# Patient Record
Sex: Male | Born: 1955 | Race: White | Hispanic: No | Marital: Married | Smoking: Former smoker
Health system: Southern US, Community
[De-identification: ages and names within clinical notes are randomized; demographics above are authoritative.]

## PROBLEM LIST (undated history)

## (undated) DIAGNOSIS — M1990 Unspecified osteoarthritis, unspecified site: Secondary | ICD-10-CM

## (undated) DIAGNOSIS — F1010 Alcohol abuse, uncomplicated: Secondary | ICD-10-CM

## (undated) DIAGNOSIS — I10 Essential (primary) hypertension: Secondary | ICD-10-CM

## (undated) DIAGNOSIS — K219 Gastro-esophageal reflux disease without esophagitis: Secondary | ICD-10-CM

## (undated) DIAGNOSIS — E785 Hyperlipidemia, unspecified: Secondary | ICD-10-CM

## (undated) DIAGNOSIS — K635 Polyp of colon: Secondary | ICD-10-CM

## (undated) DIAGNOSIS — R7989 Other specified abnormal findings of blood chemistry: Secondary | ICD-10-CM

## (undated) DIAGNOSIS — J449 Chronic obstructive pulmonary disease, unspecified: Secondary | ICD-10-CM

## (undated) DIAGNOSIS — R945 Abnormal results of liver function studies: Secondary | ICD-10-CM

## (undated) HISTORY — DX: Alcohol abuse, uncomplicated: F10.10

## (undated) HISTORY — DX: Other specified abnormal findings of blood chemistry: R79.89

## (undated) HISTORY — DX: Chronic obstructive pulmonary disease, unspecified: J44.9

## (undated) HISTORY — DX: Hyperlipidemia, unspecified: E78.5

## (undated) HISTORY — PX: OTHER SURGICAL HISTORY: SHX169

## (undated) HISTORY — DX: Polyp of colon: K63.5

## (undated) HISTORY — PX: VASECTOMY: SHX75

## (undated) HISTORY — DX: Essential (primary) hypertension: I10

## (undated) HISTORY — PX: CARPAL TUNNEL RELEASE: SHX101

## (undated) HISTORY — DX: Abnormal results of liver function studies: R94.5

---

## 2003-06-29 ENCOUNTER — Emergency Department (HOSPITAL_COMMUNITY): Admission: EM | Admit: 2003-06-29 | Discharge: 2003-06-29 | Payer: Self-pay | Admitting: Emergency Medicine

## 2003-06-29 ENCOUNTER — Encounter: Payer: Self-pay | Admitting: Emergency Medicine

## 2003-07-08 ENCOUNTER — Ambulatory Visit (HOSPITAL_COMMUNITY): Admission: RE | Admit: 2003-07-08 | Discharge: 2003-07-08 | Payer: Self-pay | Admitting: *Deleted

## 2004-04-05 ENCOUNTER — Emergency Department (HOSPITAL_COMMUNITY): Admission: EM | Admit: 2004-04-05 | Discharge: 2004-04-05 | Payer: Self-pay | Admitting: Emergency Medicine

## 2007-07-20 ENCOUNTER — Encounter: Admission: RE | Admit: 2007-07-20 | Discharge: 2007-07-20 | Payer: Self-pay | Admitting: Occupational Medicine

## 2008-04-05 ENCOUNTER — Ambulatory Visit: Payer: Self-pay | Admitting: Internal Medicine

## 2008-05-03 ENCOUNTER — Encounter: Payer: Self-pay | Admitting: Internal Medicine

## 2008-05-03 ENCOUNTER — Ambulatory Visit: Payer: Self-pay | Admitting: Internal Medicine

## 2008-05-03 DIAGNOSIS — K635 Polyp of colon: Secondary | ICD-10-CM

## 2008-05-03 HISTORY — DX: Polyp of colon: K63.5

## 2008-05-03 LAB — HM COLONOSCOPY

## 2008-05-06 ENCOUNTER — Encounter: Payer: Self-pay | Admitting: Internal Medicine

## 2009-01-31 ENCOUNTER — Ambulatory Visit: Payer: Self-pay | Admitting: Internal Medicine

## 2009-01-31 DIAGNOSIS — E781 Pure hyperglyceridemia: Secondary | ICD-10-CM

## 2009-01-31 DIAGNOSIS — I10 Essential (primary) hypertension: Secondary | ICD-10-CM

## 2009-01-31 DIAGNOSIS — G5600 Carpal tunnel syndrome, unspecified upper limb: Secondary | ICD-10-CM

## 2009-01-31 LAB — CONVERTED CEMR LAB
ALT: 30 units/L (ref 0–53)
AST: 27 units/L (ref 0–37)
Basophils Relative: 0 % (ref 0.0–3.0)
Bilirubin, Direct: 0.2 mg/dL (ref 0.0–0.3)
Calcium: 9.8 mg/dL (ref 8.4–10.5)
Chloride: 98 meq/L (ref 96–112)
Cholesterol: 224 mg/dL (ref 0–200)
Crystals: NEGATIVE
Direct LDL: 120.2 mg/dL
Eosinophils Relative: 1.5 % (ref 0.0–5.0)
GFR calc Af Amer: 101 mL/min
GFR calc non Af Amer: 84 mL/min
Glucose, Bld: 89 mg/dL (ref 70–99)
HCT: 46.2 % (ref 39.0–52.0)
HDL: 73.8 mg/dL (ref >39.0)
Hemoglobin: 16.2 g/dL (ref 13.0–17.0)
Leukocytes, UA: NEGATIVE
Neutro Abs: 4.7 10*3/uL (ref 1.4–7.7)
Platelets: 263 10*3/uL (ref 150–400)
Potassium: 4.2 meq/L (ref 3.5–5.1)
RBC: 4.81 M/uL (ref 4.22–5.81)
RDW: 11.9 % (ref 11.5–14.6)
Sodium: 136 meq/L (ref 135–145)
Specific Gravity, Urine: 1.025 (ref 1.000–1.035)
Squamous Epithelial / LPF: NEGATIVE /lpf
TSH: 2.94 microintl units/mL (ref 0.35–5.50)
Total CHOL/HDL Ratio: 3
Total Protein, Urine: NEGATIVE mg/dL
Total Protein: 7.6 g/dL (ref 6.0–8.3)
Triglycerides: 78 mg/dL (ref 0–149)
Urine Glucose: NEGATIVE mg/dL
Urobilinogen, UA: 0.2 (ref 0.0–1.0)
VLDL: 16 mg/dL (ref 0–40)
Vitamin B-12: 659 pg/mL (ref 211–911)

## 2009-02-03 ENCOUNTER — Encounter: Payer: Self-pay | Admitting: Internal Medicine

## 2009-02-06 ENCOUNTER — Encounter (INDEPENDENT_AMBULATORY_CARE_PROVIDER_SITE_OTHER): Payer: Self-pay | Admitting: *Deleted

## 2009-02-21 ENCOUNTER — Encounter: Payer: Self-pay | Admitting: Internal Medicine

## 2009-02-27 ENCOUNTER — Encounter: Payer: Self-pay | Admitting: Internal Medicine

## 2009-03-04 ENCOUNTER — Encounter: Payer: Self-pay | Admitting: Internal Medicine

## 2010-02-13 ENCOUNTER — Telehealth: Payer: Self-pay | Admitting: Internal Medicine

## 2010-02-13 ENCOUNTER — Ambulatory Visit: Payer: Self-pay | Admitting: Internal Medicine

## 2010-02-13 ENCOUNTER — Encounter: Payer: Self-pay | Admitting: Internal Medicine

## 2010-02-13 DIAGNOSIS — M87059 Idiopathic aseptic necrosis of unspecified femur: Secondary | ICD-10-CM | POA: Insufficient documentation

## 2010-02-13 DIAGNOSIS — IMO0001 Reserved for inherently not codable concepts without codable children: Secondary | ICD-10-CM

## 2010-02-13 DIAGNOSIS — M25559 Pain in unspecified hip: Secondary | ICD-10-CM

## 2010-02-13 DIAGNOSIS — F1020 Alcohol dependence, uncomplicated: Secondary | ICD-10-CM

## 2010-02-13 LAB — CONVERTED CEMR LAB
ALT: 25 units/L (ref 0–53)
AST: 21 units/L (ref 0–37)
BUN: 18 mg/dL (ref 6–23)
Basophils Absolute: 0 10*3/uL (ref 0.0–0.1)
Basophils Relative: 0.8 % (ref 0.0–3.0)
Bilirubin Urine: NEGATIVE
Bilirubin, Direct: 0.1 mg/dL (ref 0.0–0.3)
CO2: 29 meq/L (ref 19–32)
Chloride: 104 meq/L (ref 96–112)
Creatinine, Ser: 0.9 mg/dL (ref 0.4–1.5)
Eosinophils Absolute: 0.1 10*3/uL (ref 0.0–0.7)
HDL goal, serum: 40 mg/dL
HDL: 75.1 mg/dL (ref >39.00)
Hemoglobin: 14.7 g/dL (ref 13.0–17.0)
LDL Goal: 160 mg/dL
Leukocytes, UA: NEGATIVE
Lymphocytes Relative: 20.9 % (ref 12.0–46.0)
Lymphs Abs: 1 10*3/uL (ref 0.7–4.0)
MCHC: 33.3 g/dL (ref 30.0–36.0)
Monocytes Absolute: 0.4 10*3/uL (ref 0.1–1.0)
Neutrophils Relative %: 67.5 % (ref 43.0–77.0)
PSA: 0.36 ng/mL (ref 0.10–4.00)
RBC: 4.5 M/uL (ref 4.22–5.81)
Sodium: 139 meq/L (ref 135–145)
Total Bilirubin: 0.7 mg/dL (ref 0.3–1.2)
Total Protein: 7.5 g/dL (ref 6.0–8.3)
Urobilinogen, UA: 0.2 (ref 0.0–1.0)
WBC: 4.8 10*3/uL (ref 4.5–10.5)
pH: 5.5 (ref 5.0–8.0)

## 2010-02-25 ENCOUNTER — Ambulatory Visit (HOSPITAL_COMMUNITY): Admission: RE | Admit: 2010-02-25 | Discharge: 2010-02-25 | Payer: Self-pay | Admitting: Orthopedic Surgery

## 2010-04-03 ENCOUNTER — Ambulatory Visit: Payer: Self-pay | Admitting: Internal Medicine

## 2010-05-08 ENCOUNTER — Ambulatory Visit: Payer: Self-pay | Admitting: Internal Medicine

## 2010-05-08 LAB — CONVERTED CEMR LAB
ALT: 30 units/L (ref 0–53)
Albumin: 4.1 g/dL (ref 3.5–5.2)
Alkaline Phosphatase: 58 units/L (ref 39–117)
BUN: 19 mg/dL (ref 6–23)
Bilirubin, Direct: 0.1 mg/dL (ref 0.0–0.3)
Chloride: 110 meq/L (ref 96–112)
Cholesterol: 206 mg/dL — ABNORMAL HIGH (ref 0–200)
Creatinine, Ser: 0.8 mg/dL (ref 0.4–1.5)
Direct LDL: 118 mg/dL
GFR calc non Af Amer: 112.21 mL/min (ref >60)
Glucose, Bld: 106 mg/dL — ABNORMAL HIGH (ref 70–99)
HDL: 66.6 mg/dL (ref >39.00)
Sodium: 142 meq/L (ref 135–145)
Total Bilirubin: 0.7 mg/dL (ref 0.3–1.2)
Total CHOL/HDL Ratio: 3

## 2010-06-04 ENCOUNTER — Telehealth: Payer: Self-pay | Admitting: Internal Medicine

## 2010-06-14 ENCOUNTER — Encounter: Payer: Self-pay | Admitting: Internal Medicine

## 2010-06-15 ENCOUNTER — Encounter: Payer: Self-pay | Admitting: Internal Medicine

## 2010-07-01 ENCOUNTER — Telehealth: Payer: Self-pay | Admitting: Internal Medicine

## 2010-07-01 ENCOUNTER — Encounter: Payer: Self-pay | Admitting: Internal Medicine

## 2010-07-08 ENCOUNTER — Telehealth: Payer: Self-pay | Admitting: Internal Medicine

## 2010-07-28 ENCOUNTER — Encounter: Payer: Self-pay | Admitting: Internal Medicine

## 2010-12-02 ENCOUNTER — Encounter: Payer: Self-pay | Admitting: Internal Medicine

## 2010-12-08 ENCOUNTER — Encounter: Payer: Self-pay | Admitting: Internal Medicine

## 2010-12-23 ENCOUNTER — Encounter: Payer: Self-pay | Admitting: Internal Medicine

## 2010-12-29 NOTE — Medication Information (Signed)
Summary: Denial/[employer] [place]  [employer] [place]   Imported By: Bubba Hales 07/06/2010 09:47:46  _____________________________________________________________________  External Attachment:    Type:   Image     Comment:   External Document

## 2010-12-29 NOTE — Assessment & Plan Note (Signed)
Summary: 1 mos f/u #/ cd   Vital Signs:  Patient profile:   55 year old male Height:      69 inches Weight:      203.50 pounds BMI:     30.16 O2 Sat:      97 % on Room air Temp:     97.8 degrees F oral Pulse rate:   73 / minute Pulse rhythm:   regular BP sitting:   130 / 82  (left arm) Cuff size:   large  Vitals Entered By: Estell Harpin CMA (May 08, 2010 8:36 AM)  O2 Flow:  Room air CC: follow-up visit//discuss lab order for cholesterol, Hypertension Management, Lipid Management   Primary Care Provider:  Janith Lima MD  CC:  follow-up visit//discuss lab order for cholesterol, Hypertension Management, and Lipid Management.  History of Present Illness:  Follow-Up Visit      This is a 55 year old man who presents for Follow-up visit.  The patient denies chest pain, palpitations, dizziness, syncope, edema, SOB, DOE, PND, and orthopnea.  Since the last visit the patient notes no new problems or concerns and being seen by a specialist ( [place], ortho ).  The patient reports taking meds as prescribed, monitoring BP, and dietary compliance.  When questioned about possible medication side effects, the patient notes none.    Hypertension History:      He denies headache, chest pain, palpitations, neurologic problems, syncope, and side effects from treatment.  He notes no problems with any antihypertensive medication side effects.        Positive major cardiovascular risk factors include male age 51 years old or older, hyperlipidemia, and hypertension.  Negative major cardiovascular risk factors include no history of diabetes, negative family history for ischemic heart disease, and non-tobacco-user status.        Further assessment for target organ damage reveals no history of ASHD, cardiac end-organ damage (CHF/LVH), stroke/TIA, peripheral vascular disease, renal insufficiency, or hypertensive retinopathy.    Lipid Management History:      Positive NCEP/ATP III risk factors  include male age 88 years old or older and hypertension.  Negative NCEP/ATP III risk factors include non-diabetic, HDL cholesterol greater than 60, no family history for ischemic heart disease, non-tobacco-user status, no ASHD (atherosclerotic heart disease), no prior stroke/TIA, no peripheral vascular disease, and no history of aortic aneurysm.        The patient states that he knows about the "Therapeutic Lifestyle Change" diet.  His compliance with the TLC diet is good.  The patient expresses understanding of adjunctive measures for cholesterol lowering.  Adjunctive measures started by the patient include aerobic exercise, fiber, limit alcohol consumpton, and weight reduction.  He expresses no side effects from his lipid-lowering medication.  The patient denies any symptoms to suggest myopathy or liver disease.      Current Medications (verified): 1)  Exforge 5-160 Mg Tabs (Amlodipine Besylate-Valsartan) .... One By Mouth Once Daily For High Blood Pressure 2)  Lyrica 75 Mg Caps (Pregabalin) .... Take 1 Tablet By Mouth Two Times A Day 3)  Tramadol Hcl 50 Mg Tabs (Tramadol Hcl) 4)  Ibuprofen 5)  Livalo 4 Mg Tabs (Pitavastatin Calcium) .... One By Mouth Once Daily For Cholesterol  Allergies (verified): 1)  ! Lipitor (Atorvastatin Calcium)  Past History:  Past Medical History: Last updated: 01/31/2009 Hyperlipidemia Hypertension  Past Surgical History: Last updated: 01/31/2009 Vasectomy  Family History: Last updated: 01/31/2009 Family History of Alcoholism/Addiction Family History of Arthritis Family  History Diabetes 1st degree relative Family History High cholesterol Family History Hypertension  Social History: Last updated: 01/31/2009 Occupation: Dealer Married Alcohol use-yes Drug use-no Regular exercise-no  Risk Factors: Alcohol Use: >5 (04/03/2010) >5 drinks/d w/in last 3 months: yes (04/03/2010) Exercise: no (01/31/2009)  Risk Factors: Smoking Status: quit  (04/03/2010) Passive Smoke Exposure: no (04/03/2010)  Family History: Reviewed history from 01/31/2009 and no changes required. Family History of Alcoholism/Addiction Family History of Arthritis Family History Diabetes 1st degree relative Family History High cholesterol Family History Hypertension  Social History: Reviewed history from 01/31/2009 and no changes required. Occupation: Dealer Married Alcohol use-yes Drug use-no Regular exercise-no  Review of Systems  The patient denies chest pain, syncope, abdominal pain, hematuria, and difficulty walking.   MS:  Denies joint pain, joint swelling, loss of strength, low back pain, mid back pain, muscle aches, muscle weakness, and stiffness.  Physical Exam  General:  alert, well-developed, well-nourished, well-hydrated, and appropriate dress.   Mouth:  Oral mucosa and oropharynx without lesions or exudates.  Teeth in good repair. Neck:  No deformities, masses, or tenderness noted. Lungs:  Normal respiratory effort, chest expands symmetrically. Lungs are clear to auscultation, no crackles or wheezes. Heart:  Normal rate and regular rhythm. S1 and S2 normal without gallop, murmur, click, rub or other extra sounds. Abdomen:  soft, non-tender, normal bowel sounds, no distention, no masses, no guarding, no rigidity, no rebound tenderness, no abdominal hernia, no inguinal hernia, no hepatomegaly, and no splenomegaly.   Msk:  normal ROM, no joint tenderness, no joint swelling, and no joint warmth.   Pulses:  R and L carotid,radial,femoral,dorsalis pedis and posterior tibial pulses are full and equal bilaterally Extremities:  No clubbing, cyanosis, edema, or deformity noted with normal full range of motion of all joints.   Neurologic:  No cranial nerve deficits noted. Station and gait are normal. Plantar reflexes are down-going bilaterally. DTRs are symmetrical throughout. Sensory, motor and coordinative functions appear intact. Skin:   Intact without suspicious lesions or rashes Cervical Nodes:  no anterior cervical adenopathy and no posterior cervical adenopathy.   Psych:  Oriented X3, memory intact for recent and remote, normally interactive, good eye contact, not anxious appearing, not depressed appearing, not agitated, and not suicidal.     Impression & Recommendations:  Problem # 1:  HYPERTENSION (ICD-401.9) Assessment Improved  His updated medication list for this problem includes:    Exforge 5-160 Mg Tabs (Amlodipine besylate-valsartan) ..... One by mouth once daily for high blood pressure  Orders: Venipuncture (83151) TLB-Lipid Panel (80061-LIPID) TLB-BMP (Basic Metabolic Panel-BMET) (76160-[id number]) TLB-Hepatic/Liver Function Pnl (80076-HEPATIC)  BP today: 130/82 Prior BP: 140/82 (04/03/2010)  Prior 10 Yr Risk Heart Disease: 4 % (04/03/2010)  Labs Reviewed: K+: 4.6 (02/13/2010) Creat: : 0.9 (02/13/2010)   Chol: 190 (02/13/2010)   HDL: 75.10 (02/13/2010)   LDL: 99 (02/13/2010)   TG: 78.0 (02/13/2010)  Problem # 2:  HYPERLIPIDEMIA (ICD-272.4) Assessment: Improved  His updated medication list for this problem includes:    Livalo 4 Mg Tabs (Pitavastatin calcium) ..... One by mouth once daily for cholesterol  Orders: Venipuncture (69485) TLB-Lipid Panel (80061-LIPID) TLB-BMP (Basic Metabolic Panel-BMET) ([id number]) TLB-Hepatic/Liver Function Pnl (80076-HEPATIC)  Labs Reviewed: SGOT: 21 (02/13/2010)   SGPT: 25 (02/13/2010)  Lipid Goals: Chol Goal: 200 (02/13/2010)   HDL Goal: 40 (02/13/2010)   LDL Goal: 160 (02/13/2010)   TG Goal: 150 (02/13/2010)  Prior 10 Yr Risk Heart Disease: 4 % (04/03/2010)   HDL:75.10 (02/13/2010), 73.8 (01/31/2009)  LDL:99 (02/13/2010),  DEL (01/31/2009)  Chol:190 (02/13/2010), 224 (01/31/2009)  Trig:78.0 (02/13/2010), 78 (01/31/2009)  Problem # 3:  UNSPECIFIED MYALGIA AND MYOSITIS (ICD-729.1) Assessment: Improved  His updated medication list for this problem  includes:    Tramadol Hcl 50 Mg Tabs (Tramadol hcl)  Complete Medication List: 1)  Exforge 5-160 Mg Tabs (Amlodipine besylate-valsartan) .... One by mouth once daily for high blood pressure 2)  Lyrica 75 Mg Caps (Pregabalin) .... Take 1 tablet by mouth two times a day 3)  Tramadol Hcl 50 Mg Tabs (Tramadol hcl) 4)  Ibuprofen  5)  Livalo 4 Mg Tabs (Pitavastatin calcium) .... One by mouth once daily for cholesterol  Hypertension Assessment/Plan:      The patient's hypertensive risk group is category B: At least one risk factor (excluding diabetes) with no target organ damage.  His calculated 10 year risk of coronary heart disease is 4 %.  Today's blood pressure is 130/82.  His blood pressure goal is < 140/90.  Lipid Assessment/Plan:      Based on NCEP/ATP III, the patient's risk factor category is "0-1 risk factors".  The patient's lipid goals are as follows: Total cholesterol goal is 200; LDL cholesterol goal is 160; HDL cholesterol goal is 40; Triglyceride goal is 150.    Patient Instructions: 1)  Please schedule a follow-up appointment in 4 months. 2)  It is important that you exercise regularly at least 20 minutes 5 times a week. If you develop chest pain, have severe difficulty breathing, or feel very tired , stop exercising immediately and seek medical attention. 3)  You need to lose weight. Consider a lower calorie diet and regular exercise.  4)  Check your Blood Pressure regularly. If it is above 140/90: you should make an appointment. Prescriptions: LIVALO 4 MG TABS (PITAVASTATIN CALCIUM) One by mouth once daily for cholesterol  #30 x 11   Entered and Authorized by:   Janith Lima MD   Signed by:   Janith Lima MD on 05/08/2010   Method used:   Print then Give to Patient   RxID:   [id number] EXFORGE 5-160 MG TABS (AMLODIPINE BESYLATE-VALSARTAN) One by mouth once daily for high blood pressure  #30 x 11   Entered and Authorized by:   Janith Lima MD   Signed by:    Janith Lima MD on 05/08/2010   Method used:   Print then Give to Patient   RxID:   [id number]    Not Administered:    Influenza Vaccine not given due to: vaccine availability

## 2010-12-29 NOTE — Assessment & Plan Note (Signed)
Summary: CPX/WIFE MADE APPT/[employer]/#/CD   Vital Signs:  Patient profile:   55 year old male Height:      69 inches (175.26 cm) Weight:      192 pounds (87.27 kg) BMI:     28.46 O2 Sat:      97 % on Room air Temp:     98.3 degrees F (36.83 degrees C) oral Pulse rate:   80 / minute Pulse rhythm:   regular Resp:     16 per minute BP supine:   152 / 104  (right arm) BP sitting:   150 / 100  (left arm) Cuff size:   large  Vitals Entered By: Charlsie Quest, CMA (February 13, 2010 8:09 AM) Taken by Felipa Evener, SMA  Nutrition Counseling: Patient's BMI is greater than 25 and therefore counseled on weight management options.  O2 Flow:  Room air CC: Physical, leg/hip complaints /[place], Hypertension Management, Lipid Management, Preventive Care   Primary Care Provider:  Janith Lima MD  CC:  Physical, leg/hip complaints /[place], Hypertension Management, Lipid Management, and Preventive Care.  History of Present Illness: He returns for a complete physical with complaints.  1. One year hx. of bialteral hip pain with no preceding trauma or injury. The pain is so severe at the end of the day that he has some limping.  2. 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 He does not want to miss work for the recovery so he wears splints.  3. Htn. f/up.  Hypertension History:      He denies headache, chest pain, palpitations, dyspnea with exertion, orthopnea, PND, peripheral edema, visual symptoms, neurologic problems, syncope, and side effects from treatment.  He notes no problems with any antihypertensive medication side effects.        Positive major cardiovascular risk factors include male age 23 years old or older, hyperlipidemia, and hypertension.  Negative major cardiovascular risk factors include no history of diabetes, negative family history for ischemic heart disease, and non-tobacco-user status.        Further assessment for target organ  damage reveals no history of ASHD, cardiac end-organ damage (CHF/LVH), stroke/TIA, peripheral vascular disease, renal insufficiency, or hypertensive retinopathy.    Lipid Management History:      Positive NCEP/ATP III risk factors include male age 57 years old or older and hypertension.  Negative NCEP/ATP III risk factors include non-diabetic, HDL cholesterol greater than 60, no family history for ischemic heart disease, non-tobacco-user status, no ASHD (atherosclerotic heart disease), no prior stroke/TIA, no peripheral vascular disease, and no history of aortic aneurysm.        The patient states that he knows about the "Therapeutic Lifestyle Change" diet.  His compliance with the TLC diet is good.  The patient expresses understanding of adjunctive measures for cholesterol lowering.  Adjunctive measures started by the patient include aerobic exercise, fiber, and ASA.  He notes side effects from his lipid-lowering medication.  The patient notes symptoms to suggest myopathy or liver disease.  Comments include: myalgias.      Preventive Screening-Counseling & Management  Alcohol-Tobacco     Alcohol drinks/day: >5     Alcohol type: beer     >5/day in last 3 mos: yes     Alcohol Counseling: to STOP drinking     Feels need to cut down: yes     Feels annoyed by complaints: no     Feels guilty re: drinking: no  Needs 'eye opener' in am: no     Smoking Status: quit     Year Quit: 2006     Pack years: 35     Passive Smoke Exposure: no  Clinical Review Panels:  Prevention   Last Colonoscopy:  Location:  [place].  (05/03/2008)  Lipid Management   Cholesterol:  224 (01/31/2009)   LDL (bad choesterol):  DEL (01/31/2009)   HDL (good cholesterol):  73.8 (01/31/2009)  Diabetes Management   Creatinine:  1.0 (01/31/2009)  CBC   WBC:  6.5 (01/31/2009)   RBC:  4.81 (01/31/2009)   Hgb:  16.2 (01/31/2009)   Hct:  46.2 (01/31/2009)   Platelets:  263 (01/31/2009)   MCV  96.0  (01/31/2009)   MCHC  35.0 (01/31/2009)   RDW  11.9 (01/31/2009)   PMN:  72.1 (01/31/2009)   Lymphs:  19.7 (01/31/2009)   Monos:  6.7 (01/31/2009)   Eosinophils:  1.5 (01/31/2009)   Basophil:  0.0 (01/31/2009)  Complete Metabolic Panel   Glucose:  89 (01/31/2009)   Sodium:  136 (01/31/2009)   Potassium:  4.2 (01/31/2009)   Chloride:  98 (01/31/2009)   CO2:  29 (01/31/2009)   BUN:  17 (01/31/2009)   Creatinine:  1.0 (01/31/2009)   Albumin:  4.6 (01/31/2009)   Total Protein:  7.6 (01/31/2009)   Calcium:  9.8 (01/31/2009)   Total Bili:  1.5 (01/31/2009)   Alk Phos:  59 (01/31/2009)   SGPT (ALT):  30 (01/31/2009)   SGOT (AST):  27 (01/31/2009)   Current Medications (verified): 1)  Lipitor 40 Mg Tabs (Atorvastatin Calcium) .... Take 1 Tablet By Mouth Once A Day 2)  Lisinopril 20 Mg Tabs (Lisinopril) .... Take 1 Tablet By Mouth Once A Day  Allergies (verified): 1)  ! Lipitor (Atorvastatin Calcium)  Past History:  Past Medical History: Reviewed history from 01/31/2009 and no changes required. Hyperlipidemia Hypertension  Past Surgical History: Reviewed history from 01/31/2009 and no changes required. Vasectomy  Family History: Reviewed history from 01/31/2009 and no changes required. Family History of Alcoholism/Addiction Family History of Arthritis Family History Diabetes 1st degree relative Family History High cholesterol Family History Hypertension  Social History: Reviewed history from 01/31/2009 and no changes required. Occupation: Dealer Married Alcohol use-yes Drug use-no Regular exercise-no  Review of Systems       The patient complains of weight gain and difficulty walking.  The patient denies anorexia, fever, weight loss, peripheral edema, prolonged cough, headaches, hemoptysis, abdominal pain, melena, hematochezia, severe indigestion/heartburn, hematuria, incontinence, genital sores, muscle weakness, suspicious skin lesions, depression, abnormal  bleeding, enlarged lymph nodes, angioedema, and testicular masses.   MS:  Complains of joint pain, muscle aches, and stiffness; denies joint redness, joint swelling, loss of strength, low back pain, mid back pain, cramps, muscle weakness, and thoracic pain. Psych:  Denies anxiety, depression, easily angered, easily tearful, irritability, panic attacks, suicidal thoughts/plans, thoughts of violence, unusual visions or sounds, and thoughts /plans of harming others.  Physical Exam  General:  alert, well-developed, well-nourished, well-hydrated, and appropriate dress.   Head:  normocephalic, atraumatic, no abnormalities observed, and no abnormalities palpated.   Eyes:  no icterus. Ears:  R ear normal and L ear normal.   Mouth:  Oral mucosa and oropharynx without lesions or exudates.  Teeth in good repair. Neck:  No deformities, masses, or tenderness noted. Lungs:  Normal respiratory effort, chest expands symmetrically. Lungs are clear to auscultation, no crackles or wheezes. Heart:  Normal rate and regular rhythm. S1 and  S2 normal without gallop, murmur, click, rub or other extra sounds. Abdomen:  soft, non-tender, normal bowel sounds, no distention, no masses, no guarding, no rigidity, no rebound tenderness, no abdominal hernia, no inguinal hernia, no hepatomegaly, and no splenomegaly.   Rectal:  No external abnormalities noted. Normal sphincter tone. No rectal masses or tenderness. heme neg. stool. Genitalia:  uncircumcised, no hydrocele, no varicocele, no scrotal masses, no cutaneous lesions, no urethral discharge, R testes atrophic, and L testes atrophic.   Prostate:  Prostate gland firm and smooth, no enlargement, nodularity, tenderness, mass, asymmetry or induration. Msk:  No deformity or scoliosis noted of thoracic or lumbar spine.   Pulses:  R and L carotid,radial,femoral,dorsalis pedis and posterior tibial pulses are full and equal bilaterally Extremities:  No clubbing, cyanosis, edema, or  deformity noted with normal full range of motion of all joints.   Neurologic:  No cranial nerve deficits noted. Station and gait are normal. Plantar reflexes are down-going bilaterally. DTRs are symmetrical throughout. Sensory, motor and coordinative functions appear intact. Skin:  Intact without suspicious lesions or rashes Cervical Nodes:  no anterior cervical adenopathy and no posterior cervical adenopathy.   Axillary Nodes:  no R axillary adenopathy and no L axillary adenopathy.   Inguinal Nodes:  no R inguinal adenopathy and no L inguinal adenopathy.   Psych:  Oriented X3, memory intact for recent and remote, normally interactive, good eye contact, not depressed appearing, not agitated, not suicidal, and slightly anxious.   Additional Exam:  EKG is normal.   Impression & Recommendations:  Problem # 1:  ROUTINE GENERAL MEDICAL EXAM@HEALTH  CARE FACL (ICD-V70.0)  Colonoscopy: Location:  [place].   (05/03/2008) Chol: 224 (01/31/2009)   HDL: 73.8 (01/31/2009)   LDL: DEL (01/31/2009)   TG: 78 (01/31/2009) TSH: 2.94 (01/31/2009)   Next Colonoscopy due:: 04/2015 (05/03/2008)  Discussed using sunscreen, use of alcohol, drug use, self testicular exam, routine dental care, routine eye care, routine physical exam, seat belts, multiple vitamins,  and recommendations for immunizations.  Discussed exercise and checking cholesterol.   Also recommend checking PSA.  Orders: Hemoccult Guaiac-1 spec.(in office) (82270)  Problem # 2:  UNSPECIFIED MYALGIA AND MYOSITIS (ICD-729.1) Assessment: New will stop the statin and check a CPK and ESR. Orders: Venipuncture (38756) TLB-Lipid Panel (80061-LIPID) TLB-BMP (Basic Metabolic Panel-BMET) ([id number]) TLB-CBC Platelet - w/Differential (85025-CBCD) TLB-Hepatic/Liver Function Pnl (80076-HEPATIC) TLB-TSH (Thyroid Stimulating Hormone) (84443-TSH) TLB-CK Total Only(Creatine Kinase/CPK) (82550-CK) TLB-PSA (Prostate Specific Antigen)  (84153-PSA) TLB-Sedimentation Rate (ESR) (85652-ESR) TLB-Udip w/ Micro (81001-URINE)  Problem # 3:  HIP PAIN, BILATERAL (ICD-719.45) Assessment: New will check plain films for avn, djd, etc Orders: Venipuncture (60630) TLB-Lipid Panel (80061-LIPID) TLB-BMP (Basic Metabolic Panel-BMET) ([id number]) TLB-CBC Platelet - w/Differential (85025-CBCD) TLB-Hepatic/Liver Function Pnl (80076-HEPATIC) TLB-TSH (Thyroid Stimulating Hormone) (84443-TSH) TLB-CK Total Only(Creatine Kinase/CPK) (82550-CK) TLB-PSA (Prostate Specific Antigen) (84153-PSA) TLB-Sedimentation Rate (ESR) (85652-ESR) TLB-Udip w/ Micro (81001-URINE) Orthopedic Referral (Ortho) T-Bilateral Hip w/Pelvis, min 2 views (73520TC)  Problem # 4:  HYPERTENSION (ICD-401.9) Assessment: Deteriorated  The following medications were removed from the medication list:    Lisinopril 20 Mg Tabs (Lisinopril) .Marland Kitchen... Take 1 tablet by mouth once a day His updated medication list for this problem includes:    Exforge 5-160 Mg Tabs (Amlodipine besylate-valsartan) ..... One by mouth once daily for high blood pressure  Orders: Venipuncture (32202) TLB-Lipid Panel (80061-LIPID) TLB-BMP (Basic Metabolic Panel-BMET) (54270-[id number]) TLB-CBC Platelet - w/Differential (85025-CBCD) TLB-Hepatic/Liver Function Pnl (80076-HEPATIC) TLB-TSH (Thyroid Stimulating Hormone) (84443-TSH) TLB-CK Total Only(Creatine Kinase/CPK) (82550-CK) TLB-PSA (Prostate Specific  Antigen) (84153-PSA) TLB-Sedimentation Rate (ESR) (85652-ESR) TLB-Udip w/ Micro (81001-URINE)  BP today: 150/100 Prior BP: 130/84 (01/31/2009)  Prior 10 Yr Risk Heart Disease: Not enough information (01/31/2009)  Labs Reviewed: K+: 4.2 (01/31/2009) Creat: : 1.0 (01/31/2009)   Chol: 224 (01/31/2009)   HDL: 73.8 (01/31/2009)   LDL: DEL (01/31/2009)   TG: 78 (01/31/2009)  Problem # 5:  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 One by mouth once daily for high blood pressure  Other Orders: EKG w/ Interpretation (93000)  Hypertension Assessment/Plan:      The patient's hypertensive risk group is category B: At least one risk factor (excluding diabetes) with no target organ damage.  Today's blood pressure is 150/100.  His blood pressure goal is < 140/90.  Lipid Assessment/Plan:      Based on NCEP/ATP III, the patient's risk factor category is "2 or more risk factors and a calculated 10 year CAD risk of > 20%".  The patient's lipid goals are as follows: Total cholesterol goal is 200; LDL cholesterol goal is 160; HDL cholesterol goal is 40; Triglyceride goal is 150.    Colorectal Screening:  Current Recommendations:    Hemoccult: NEG X 1 today  Patient Instructions: 1)  Please schedule a follow-up appointment in 1 month. 2)  It is important that you exercise regularly at least 20 minutes 5 times a week. If you develop chest pain, have severe difficulty breathing, or feel very tired , stop exercising immediately and seek medical attention. 3)  You need to lose weight. Consider a lower calorie diet and regular exercise.  4)  It is not healthy  for men to drink more than 2-3 drinks per day or for women to drink more than 1-2 drinks per day. 5)  Check your Blood Pressure regularly. If it is above 140/90: you should make an appointment. Prescriptions: EXFORGE 5-160 MG TABS (AMLODIPINE BESYLATE-VALSARTAN) One by mouth once daily for high blood pressure  #84 x 0   Entered and Authorized by:   Janith Lima MD   Signed by:   Charlsie Quest, CMA on 02/13/2010   Method used:   Samples Given   RxID:   [id number]

## 2010-12-29 NOTE — Medication Information (Signed)
Summary: Coverage  Authorization Form for [employer] / [employer]   Coverage  Authorization Form for [employer] / [employer]   Imported By: Rise Patience 06/16/2010 14:39:40  _____________________________________________________________________  External Attachment:    Type:   Image     Comment:   External Document

## 2010-12-29 NOTE — Progress Notes (Signed)
Summary: RESULTS  Phone Note Call from Patient   Summary of Call: Pt was informed of ortho referral by [facility]. He is req results of xray and also needs to know whether or not to continue lipitor?  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

## 2010-12-29 NOTE — Assessment & Plan Note (Signed)
Summary: per wife fu--was taken off med--stc   Vital Signs:  Patient profile:   55 year old male Height:      69 inches Weight:      204.75 pounds BMI:     30.35 O2 Sat:      96 % on Room air Temp:     98.1 degrees F oral Pulse rate:   73 / minute Pulse rhythm:   regular Resp:     16 per minute BP sitting:   140 / 82  (left arm) Cuff size:   large  Vitals Entered By: Hudson (Apr 03, 2010 3:53 PM)  Nutrition Counseling: Patient's BMI is greater than 25 and therefore counseled on weight management options.  O2 Flow:  Room air  Primary Care Provider:  Janith Lima MD   History of Present Illness: He returns to consider trying a new statin. He had some back/hip pain on Lipitor. Otherwise he feels well.  Hypertension History:      He denies headache, chest pain, palpitations, dyspnea with exertion, orthopnea, PND, peripheral edema, visual symptoms, neurologic problems, syncope, and side effects from treatment.  He notes no problems with any antihypertensive medication side effects.        Positive major cardiovascular risk factors include male age 67 years old or older, hyperlipidemia, and hypertension.  Negative major cardiovascular risk factors include no history of diabetes, negative family history for ischemic heart disease, and non-tobacco-user status.        Further assessment for target organ damage reveals no history of ASHD, cardiac end-organ damage (CHF/LVH), stroke/TIA, peripheral vascular disease, renal insufficiency, or hypertensive retinopathy.    Lipid Management History:      Positive NCEP/ATP III risk factors include male age 1 years old or older and hypertension.  Negative NCEP/ATP III risk factors include non-diabetic, HDL cholesterol greater than 60, no family history for ischemic heart disease, non-tobacco-user status, no ASHD (atherosclerotic heart disease), no prior stroke/TIA, no peripheral vascular disease, and no history of aortic aneurysm.       The patient states that he knows about the "Therapeutic Lifestyle Change" diet.  His compliance with the TLC diet is good.  The patient expresses understanding of adjunctive measures for cholesterol lowering.  Adjunctive measures started by the patient include aerobic exercise, fiber, ASA, limit alcohol consumpton, and weight reduction.  He expresses no side effects from his lipid-lowering medication.  The patient denies any symptoms to suggest myopathy or liver disease.      Preventive Screening-Counseling & Management  Alcohol-Tobacco     Alcohol drinks/day: >5     Alcohol type: beer     >5/day in last 3 mos: yes     Alcohol Counseling: to STOP drinking     Feels need to cut down: yes     Feels annoyed by complaints: no     Feels guilty re: drinking: no     Needs 'eye opener' in am: no     Smoking Status: quit     Year Quit: 2006     Pack years: 35     Passive Smoke Exposure: no  Hep-HIV-STD-Contraception     Hepatitis Risk: no risk noted     HIV Risk: no risk noted     STD Risk: no risk noted      Drug Use:  no.    Clinical Review Panels:  Lipid Management   Cholesterol:  190 (02/13/2010)   LDL (bad choesterol):  99 (02/13/2010)  HDL (good cholesterol):  75.10 (02/13/2010)  Diabetes Management   Creatinine:  0.9 (02/13/2010)  CBC   WBC:  4.8 (02/13/2010)   RBC:  4.50 (02/13/2010)   Hgb:  14.7 (02/13/2010)   Hct:  44.1 (02/13/2010)   Platelets:  259.0 (02/13/2010)   MCV  97.9 (02/13/2010)   MCHC  33.3 (02/13/2010)   RDW  12.0 (02/13/2010)   PMN:  67.5 (02/13/2010)   Lymphs:  20.9 (02/13/2010)   Monos:  8.8 (02/13/2010)   Eosinophils:  2.0 (02/13/2010)   Basophil:  0.8 (02/13/2010)  Complete Metabolic Panel   Glucose:  101 (02/13/2010)   Sodium:  139 (02/13/2010)   Potassium:  4.6 (02/13/2010)   Chloride:  104 (02/13/2010)   CO2:  29 (02/13/2010)   BUN:  18 (02/13/2010)   Creatinine:  0.9 (02/13/2010)   Albumin:  4.4 (02/13/2010)   Total Protein:  7.5  (02/13/2010)   Calcium:  9.5 (02/13/2010)   Total Bili:  0.7 (02/13/2010)   Alk Phos:  61 (02/13/2010)   SGPT (ALT):  25 (02/13/2010)   SGOT (AST):  21 (02/13/2010)   Medications Prior to Update: 1)  Exforge 5-160 Mg Tabs (Amlodipine Besylate-Valsartan) .... One By Mouth Once Daily For High Blood Pressure  Current Medications (verified): 1)  Exforge 5-160 Mg Tabs (Amlodipine Besylate-Valsartan) .... One By Mouth Once Daily For High Blood Pressure 2)  Lyrica 75 Mg Caps (Pregabalin) .... Take 1 Tablet By Mouth Two Times A Day 3)  Tramadol Hcl 50 Mg Tabs (Tramadol Hcl) 4)  Ibuprofen 5)  Livalo 4 Mg Tabs (Pitavastatin Calcium) .... One By Mouth Once Daily For Cholesterol  Allergies (verified): 1)  ! Lipitor (Atorvastatin Calcium)  Past History:  Past Medical History: Reviewed history from 01/31/2009 and no changes required. Hyperlipidemia Hypertension  Past Surgical History: Reviewed history from 01/31/2009 and no changes required. Vasectomy  Family History: Reviewed history from 01/31/2009 and no changes required. Family History of Alcoholism/Addiction Family History of Arthritis Family History Diabetes 1st degree relative Family History High cholesterol Family History Hypertension  Social History: Reviewed history from 01/31/2009 and no changes required. Occupation: Dealer Married Alcohol use-yes Drug use-no Regular exercise-no Hepatitis Risk:  no risk noted HIV Risk:  no risk noted STD Risk:  no risk noted  Review of Systems  The patient denies anorexia, chest pain, syncope, dyspnea on exertion, peripheral edema, prolonged cough, abdominal pain, hematuria, and suspicious skin lesions.    Physical Exam  General:  alert, well-developed, well-nourished, well-hydrated, and appropriate dress.   Head:  normocephalic, atraumatic, no abnormalities observed, and no abnormalities palpated.   Mouth:  Oral mucosa and oropharynx without lesions or exudates.  Teeth in  good repair. Neck:  No deformities, masses, or tenderness noted. Lungs:  Normal respiratory effort, chest expands symmetrically. Lungs are clear to auscultation, no crackles or wheezes. Heart:  Normal rate and regular rhythm. S1 and S2 normal without gallop, murmur, click, rub or other extra sounds. Abdomen:  soft, non-tender, normal bowel sounds, no distention, no masses, no guarding, no rigidity, no rebound tenderness, no abdominal hernia, no inguinal hernia, no hepatomegaly, and no splenomegaly.   Msk:  normal ROM, no joint tenderness, no joint swelling, and no joint warmth.   Pulses:  R and L carotid,radial,femoral,dorsalis pedis and posterior tibial pulses are full and equal bilaterally Extremities:  No clubbing, cyanosis, edema, or deformity noted with normal full range of motion of all joints.   Neurologic:  No cranial nerve deficits noted. Station and gait are  normal. Plantar reflexes are down-going bilaterally. DTRs are symmetrical throughout. Sensory, motor and coordinative functions appear intact. Skin:  Intact without suspicious lesions or rashes Psych:  Oriented X3, memory intact for recent and remote, normally interactive, good eye contact, not anxious appearing, not depressed appearing, not agitated, and not suicidal.     Impression & Recommendations:  Problem # 1:  UNSPECIFIED MYALGIA AND MYOSITIS (ICD-729.1) Assessment Improved  His updated medication list for this problem includes:    Tramadol Hcl 50 Mg Tabs (Tramadol hcl)  Problem # 2:  HYPERLIPIDEMIA (ICD-272.4) Assessment: Unchanged  His updated medication list for this problem includes:    Livalo 4 Mg Tabs (Pitavastatin calcium) ..... One by mouth once daily for cholesterol  Labs Reviewed: SGOT: 21 (02/13/2010)   SGPT: 25 (02/13/2010)  Lipid Goals: Chol Goal: 200 (02/13/2010)   HDL Goal: 40 (02/13/2010)   LDL Goal: 160 (02/13/2010)   TG Goal: 150 (02/13/2010)  Prior 10 Yr Risk Heart Disease: Not enough  information (01/31/2009)   HDL:75.10 (02/13/2010), 73.8 (01/31/2009)  LDL:99 (02/13/2010), DEL (01/31/2009)  Chol:190 (02/13/2010), 224 (01/31/2009)  Trig:78.0 (02/13/2010), 78 (01/31/2009)  Problem # 3:  HYPERTENSION (ICD-401.9) Assessment: Improved  His updated medication list for this problem includes:    Exforge 5-160 Mg Tabs (Amlodipine besylate-valsartan) ..... One by mouth once daily for high blood pressure  BP today: 140/82 Prior BP: 152/104 (02/13/2010)  10 Yr Risk Heart Disease: 4 % Prior 10 Yr Risk Heart Disease: Not enough information (01/31/2009)  Labs Reviewed: K+: 4.6 (02/13/2010) Creat: : 0.9 (02/13/2010)   Chol: 190 (02/13/2010)   HDL: 75.10 (02/13/2010)   LDL: 99 (02/13/2010)   TG: 78.0 (02/13/2010)  Complete Medication List: 1)  Exforge 5-160 Mg Tabs (Amlodipine besylate-valsartan) .... One by mouth once daily for high blood pressure 2)  Lyrica 75 Mg Caps (Pregabalin) .... Take 1 tablet by mouth two times a day 3)  Tramadol Hcl 50 Mg Tabs (Tramadol hcl) 4)  Ibuprofen  5)  Livalo 4 Mg Tabs (Pitavastatin calcium) .... One by mouth once daily for cholesterol  Hypertension Assessment/Plan:      The patient's hypertensive risk group is category B: At least one risk factor (excluding diabetes) with no target organ damage.  His calculated 10 year risk of coronary heart disease is 4 %.  Today's blood pressure is 140/82.  His blood pressure goal is < 140/90.  Lipid Assessment/Plan:      Based on NCEP/ATP III, the patient's risk factor category is "0-1 risk factors".  The patient's lipid goals are as follows: Total cholesterol goal is 200; LDL cholesterol goal is 160; HDL cholesterol goal is 40; Triglyceride goal is 150.    Patient Instructions: 1)  Please schedule a follow-up appointment in 1 month. 2)  It is important that you exercise regularly at least 20 minutes 5 times a week. If you develop chest pain, have severe difficulty breathing, or feel very tired , stop  exercising immediately and seek medical attention. 3)  You need to lose weight. Consider a lower calorie diet and regular exercise.  4)  Check your Blood Pressure regularly. If it is above 140/90: you should make an appointment. Prescriptions: EXFORGE 5-160 MG TABS (AMLODIPINE BESYLATE-VALSARTAN) One by mouth once daily for high blood pressure  #84 x 0   Entered and Authorized by:   Janith Lima MD   Signed by:   Janith Lima MD on 04/03/2010   Method used:   Samples Given   RxID:  [id number] LIVALO 4 MG TABS (PITAVASTATIN CALCIUM) One by mouth once daily for cholesterol  #35 x 0   Entered and Authorized by:   Janith Lima MD   Signed by:   Janith Lima MD on 04/03/2010   Method used:   Samples Given   RxID:   [id number]

## 2010-12-29 NOTE — Letter (Signed)
Summary: Lipid Letter  [facility]   [place], [place] [address]   Phone: [phone]  Fax: [phone]    02/13/2010  Kenneth Singleton [address] [place], [place]  [address]  Dear Kenneth Singleton:  We have carefully reviewed your last lipid profile from 02/13/2010 and the results are noted below with a summary of recommendations for lipid management.    Cholesterol:       190     Goal: <200   HDL "good" Cholesterol:   75.10     Goal: >40   LDL "bad" Cholesterol:   99     Goal: <160   Triglycerides:       78.0     Goal: <150        TLC Diet (Therapeutic Lifestyle Change): Saturated Fats & Transfatty acids should be kept < 7% of total calories ***Reduce Saturated Fats Polyunstaurated Fat can be up to 10% of total calories Monounsaturated Fat Fat can be up to 20% of total calories Total Fat should be no greater than 25-35% of total calories Carbohydrates should be 50-60% of total calories Protein should be approximately 15% of total calories Fiber should be at least 20-30 grams a day ***Increased fiber may help lower LDL Total Cholesterol should be < 288m/day Consider adding plant stanol/sterols to diet (example: Benacol spread) ***A higher intake of unsaturated fat may reduce Triglycerides and Increase HDL    Adjunctive Measures (may lower LIPIDS and reduce risk of Heart Attack) include: Aerobic Exercise (20-30 minutes 3-4 times a week) Limit Alcohol Consumption Weight Reduction Aspirin 75-81 mg a day by mouth (if not allergic or contraindicated) Dietary Fiber 20-30 grams a day by mouth     Current Medications: 1)    Exforge 5-160 Mg Tabs (Amlodipine besylate-valsartan) .... One by mouth once daily for high blood pressure  If you have any questions, please call. We appreciate being able to work with you.   Sincerely,    [facility] Kenneth LimaMD

## 2010-12-29 NOTE — Progress Notes (Signed)
Summary: Livalo PA  Phone Note From Pharmacy   Summary of Call: PA request--Livalo. Has the patient tried and failed generic statin options? Please advise. Initial call taken by: Ernestene Mention,  June 04, 2010 9:59 AM  Follow-up for Phone Call        Spoke w/pt's wife. Pt has been on lipitor -caused muscle pain, pravastatin & lovastatin - ineffective & stomach upset. Follow-up by: Charlsie Quest, [place],  June 12, 2010 4:12 PM  Additional Follow-up for Phone Call Additional follow up Details #1::        Form pending MD completion..............Marland KitchenCharlsie Quest, CMA  June 12, 2010 4:37 PM      Appended Document: Chanda Busing PA Form was completed and faxed to [facility] on 06-15-10.  ([phone] ID:  [facility] [id number]).  Appended Document: Livalo PA Approved until 2012.

## 2010-12-29 NOTE — Medication Information (Signed)
Summary: Prior autho & approved for Diovan/[employer] [place]  Prior autho & approved for Diovan/[employer] [place]   Imported By: Phillis Knack 07/31/2010 13:55:20  _____________________________________________________________________  External Attachment:    Type:   Image     Comment:   External Document

## 2010-12-29 NOTE — Letter (Signed)
Summary: Lipid Letter  [facility]   [place], [place] [address]   Phone: [phone]  Fax: [phone]    05/08/2010  Kenneth Singleton [address] [place], [place]  [address]  Dear [name]:  We have carefully reviewed your last lipid profile from 02/13/2010 and the results are noted below with a summary of recommendations for lipid management.    Cholesterol:       206     Goal: <200   HDL "good" Cholesterol:   66.60     Goal: >40   LDL "bad" Cholesterol:   118     Goal: <160   Triglycerides:       103.0     Goal: <150    VERY GOOD RESULTS!!!!!!!!    TLC Diet (Therapeutic Lifestyle Change): Saturated Fats & Transfatty acids should be kept < 7% of total calories ***Reduce Saturated Fats Polyunstaurated Fat can be up to 10% of total calories Monounsaturated Fat Fat can be up to 20% of total calories Total Fat should be no greater than 25-35% of total calories Carbohydrates should be 50-60% of total calories Protein should be approximately 15% of total calories Fiber should be at least 20-30 grams a day ***Increased fiber may help lower LDL Total Cholesterol should be < 231m/day Consider adding plant stanol/sterols to diet (example: Benacol spread) ***A higher intake of unsaturated fat may reduce Triglycerides and Increase HDL    Adjunctive Measures (may lower LIPIDS and reduce risk of Heart Attack) include: Aerobic Exercise (20-30 minutes 3-4 times a week) Limit Alcohol Consumption Weight Reduction Aspirin 75-81 mg a day by mouth (if not allergic or contraindicated) Dietary Fiber 20-30 grams a day by mouth     Current Medications: 1)    Exforge 5-160 Mg Tabs (Amlodipine besylate-valsartan) .... One by mouth once daily for high blood pressure 2)    Lyrica 75 Mg Caps (Pregabalin) .... Take 1 tablet by mouth two times a day 3)    Tramadol Hcl 50 Mg Tabs (Tramadol hcl) 4)    Ibuprofen  5)    Livalo 4 Mg Tabs (Pitavastatin calcium) .... One by  mouth once daily for cholesterol  If you have any questions, please call. We appreciate being able to work with you.   Sincerely,     [facility] TJanith LimaMD

## 2010-12-29 NOTE — Medication Information (Signed)
Summary: Approved/[employer] [place]  Approved/[employer] [place]   Imported By: Bubba Hales 06/18/2010 08:28:32  _____________________________________________________________________  External Attachment:    Type:   Image     Comment:   External Document

## 2010-12-29 NOTE — Progress Notes (Signed)
Summary: PA-Diovan  Phone Note From Pharmacy   Summary of Call: PA-Diovan faxed to [facility] @ [phone], awaiting approval. Initial call taken by: Ophelia Charter,  July 08, 2010 2:39 PM  Follow-up for Phone Call        Insurance Denied, pt must fail and try step 1 ace inhibitor or generic step 1 arb, such as benazepril, quinapril, ramipril, etc.  Follow-up by: Ophelia Charter,  July 29, 2010 8:26 AM  Additional Follow-up for Phone Call Additional follow up Details #1::        done Additional Follow-up by: Janith Lima MD,  July 29, 2010 8:28 AM    Additional Follow-up for Phone Call Additional follow up Details #2::    Patient notified /lmovm.Marland KitchenMarland KitchenEllison Hughs Archie CMA  July 30, 2010 9:34 AM   New/Updated Medications: LOSARTAN POTASSIUM-HCTZ 100-12.5 MG TABS (LOSARTAN POTASSIUM-HCTZ) One by mouth once daily for high blood pressure Prescriptions: LOSARTAN POTASSIUM-HCTZ 100-12.5 MG TABS (LOSARTAN POTASSIUM-HCTZ) One by mouth once daily for high blood pressure  #30 x 11   Entered and Authorized by:   Janith Lima MD   Signed by:   Janith Lima MD on 07/29/2010   Method used:   Electronically to        [facility] [phone]* (retail)       [address]       [place], [place]  [address]       Ph: [phone]       Fax: [phone]   RxID:   [id number]

## 2010-12-29 NOTE — Progress Notes (Signed)
Summary: PA-Exforge  Phone Note From Pharmacy   Summary of Call: PA-Exforge faxed to [facility] @ [phone], awaiting approval. Initial call taken by: Ophelia Charter,  July 01, 2010 11:52 AM  Follow-up for Phone Call        Insurance denied Exforge, pt does not meet medical criteria guidelines, see notice on desk.Please advise. Follow-up by: Ophelia Charter,  July 01, 2010 2:27 PM  Additional Follow-up for Phone Call Additional follow up Details #1::        Which pharmacy should this go to?......Marland KitchenEllison Hughs Archie CMA  July 02, 2010 8:35 AM     New/Updated Medications: DIOVAN HCT 160-12.5 MG TABS (VALSARTAN-HYDROCHLOROTHIAZIDE) One by mouth once daily for high blood pressure Prescriptions: DIOVAN HCT 160-12.5 MG TABS (VALSARTAN-HYDROCHLOROTHIAZIDE) One by mouth once daily for high blood pressure  #30 x 11   Entered by:   Estell Harpin CMA   Authorized by:   Janith Lima MD   Signed by:   Estell Harpin CMA on 07/03/2010   Method used:   Electronically to        [facility] [phone]* (retail)       [address]       [place], [place]  [address]       Ph: [phone]       Fax: [phone]   RxID:   [id number] [place] HCT 160-12.5 MG TABS (VALSARTAN-HYDROCHLOROTHIAZIDE) One by mouth once daily for high blood pressure  #30 x 11   Entered and Authorized by:   Janith Lima MD   Signed by:   Janith Lima MD on 07/01/2010   Method used:   Historical   RxID:   [id number]

## 2010-12-31 NOTE — Letter (Signed)
Summary: [facility]   Imported By: Phillis Knack 12/10/2010 14:13:57  _____________________________________________________________________  External Attachment:    Type:   Image     Comment:   External Document

## 2010-12-31 NOTE — Op Note (Signed)
Summary: [facility]   Imported By: Phillis Knack 12/15/2010 09:36:48  _____________________________________________________________________  External Attachment:    Type:   Image     Comment:   External Document

## 2011-01-14 NOTE — Letter (Signed)
Summary: [facility]   Imported By: Phillis Knack 01/05/2011 07:57:51  _____________________________________________________________________  External Attachment:    Type:   Image     Comment:   External Document

## 2011-04-16 NOTE — Cardiovascular Report (Signed)
NAME:  Kenneth Singleton, Kenneth Singleton NO.:  [id number]   MEDICAL RECORD NO.:  [id number]                   PATIENT TYPE:  OIB   LOCATION:  2899                                 FACILITY:  [facility]   PHYSICIAN:  Fabio Asa, M.D.                 DATE OF BIRTH:  1956-06-02   DATE OF PROCEDURE:  07/08/2003  DATE OF DISCHARGE:                              CARDIAC CATHETERIZATION   PROCEDURE PERFORMED:  Cardiac catheterization.   CARDIOLOGIST:  Fabio Asa, M.D.   INDICATIONS FOR PROCEDURE:  Ongoing chest pain.  Abnormal Cardiolite.   DESCRIPTION OF PROCEDURE:  After obtaining written informed consent the  patient was brought to the cardiac catheterization lab in a postabsorptive  state.  Preop sedation was achieved using IV Versed.  The right groin was  prepped and draped in the usual sterile fashion.  Local anesthesia was  achieved using 1% Xylocaine.  A 6 French hemostasis sheath was placed into  the right femoral artery using the modified Seldinger technique.  Right  coronary angiography was performed using JL-4 and JR-4 Judkins catheters.  Multiple views were obtained.  All catheter exchanges were made over a guide  wire.  There was no identifiable coronary artery disease.   There were no early complications.   The patient was transferred to the holding area.  The hemostasis sheath was  removed.  Hemostasis was achieved using the FemoStop device.   FINDINGS:  Ventriculography:  Single plane ventriculogram revealed normal  wall motion.  Ejection fraction of 65%.  There was no mitral regurgitation  noted.  The aortic pressure was 112/74.  LV pressure was 114/9 with an EDP  of 11.   Coronary Angiography:  Left Main Coronary Artery:  The left main coronary  artery bifurcates into the left anterior descending an circumflex vessel.  There is no disease in the left main coronary artery.   Left Anterior Descending:  The left anterior descending gives rise to a  small D-1, moderate D-2, small D-3 and goes on to end as an apical branch.  There is no disease in the left anterior descending or its branches.   Circumflex Vessel:  The circumflex vessel is a large caliber vessel giving  rise to a large OM-1, a smaller OM-2 and then ending as an AV groove vessel.  There is no disease in the circumflex or its branches.   Right Coronary Artery:  The right coronary artery gives rise to three small  RV marginals, PDA and a PL branch.  There is no disease in the right  coronary artery or its branches.   FINAL IMPRESSION:  1. Normal coronary angiography.  2.     Normal single plane ventriculogram.  3. False positive stress Cardiolite.   RECOMMENDATIONS:  Consider other etiologies for his chest pain.  Fabio Asa, M.D.    HP/MEDQ  D:  07/08/2003  T:  07/08/2003  Job:  [id number]

## 2011-07-02 ENCOUNTER — Ambulatory Visit (INDEPENDENT_AMBULATORY_CARE_PROVIDER_SITE_OTHER): Payer: BC Managed Care – PPO | Admitting: Internal Medicine

## 2011-07-02 ENCOUNTER — Other Ambulatory Visit: Payer: Self-pay | Admitting: Internal Medicine

## 2011-07-02 ENCOUNTER — Other Ambulatory Visit (INDEPENDENT_AMBULATORY_CARE_PROVIDER_SITE_OTHER): Payer: BC Managed Care – PPO

## 2011-07-02 ENCOUNTER — Encounter: Payer: Self-pay | Admitting: Internal Medicine

## 2011-07-02 VITALS — BP 134/88 | HR 68 | Temp 98.7°F | Resp 16 | Wt 205.5 lb

## 2011-07-02 DIAGNOSIS — L309 Dermatitis, unspecified: Secondary | ICD-10-CM | POA: Insufficient documentation

## 2011-07-02 DIAGNOSIS — Z0000 Encounter for general adult medical examination without abnormal findings: Secondary | ICD-10-CM

## 2011-07-02 DIAGNOSIS — I10 Essential (primary) hypertension: Secondary | ICD-10-CM

## 2011-07-02 DIAGNOSIS — E785 Hyperlipidemia, unspecified: Secondary | ICD-10-CM

## 2011-07-02 DIAGNOSIS — F1020 Alcohol dependence, uncomplicated: Secondary | ICD-10-CM

## 2011-07-02 DIAGNOSIS — L259 Unspecified contact dermatitis, unspecified cause: Secondary | ICD-10-CM

## 2011-07-02 LAB — COMPREHENSIVE METABOLIC PANEL
ALT: 48 U/L (ref 0–53)
Alkaline Phosphatase: 59 U/L (ref 39–117)
Sodium: 137 mEq/L (ref 135–145)
Total Bilirubin: 1 mg/dL (ref 0.3–1.2)
Total Protein: 7.1 g/dL (ref 6.0–8.3)

## 2011-07-02 LAB — URINALYSIS, ROUTINE W REFLEX MICROSCOPIC
Ketones, ur: NEGATIVE
Leukocytes, UA: NEGATIVE
Specific Gravity, Urine: >=1.03 (ref 1.000–1.030)
Urine Glucose: NEGATIVE
pH: 5 (ref 5.0–8.0)

## 2011-07-02 LAB — CBC WITH DIFFERENTIAL/PLATELET
Basophils Absolute: 0.1 10*3/uL (ref 0.0–0.1)
HCT: 43.8 % (ref 39.0–52.0)
Lymphs Abs: 0.9 10*3/uL (ref 0.7–4.0)
MCHC: 34.3 g/dL (ref 30.0–36.0)
MCV: 97.9 fl (ref 78.0–100.0)
Monocytes Absolute: 0.5 10*3/uL (ref 0.1–1.0)
Platelets: 229 10*3/uL (ref 150.0–400.0)
RDW: 12.7 % (ref 11.5–14.6)

## 2011-07-02 LAB — PSA: PSA: 0.32 ng/mL (ref 0.10–4.00)

## 2011-07-02 LAB — LIPID PANEL
Cholesterol: 260 mg/dL — ABNORMAL HIGH (ref 0–200)
Total CHOL/HDL Ratio: 5
Triglycerides: 289 mg/dL — ABNORMAL HIGH (ref 0.0–149.0)

## 2011-07-02 LAB — LDL CHOLESTEROL, DIRECT: Direct LDL: 149.5 mg/dL

## 2011-07-02 LAB — TSH: TSH: 1.66 u[IU]/mL (ref 0.35–5.50)

## 2011-07-02 MED ORDER — PIMECROLIMUS 1 % EX CREA: TOPICAL_CREAM | Freq: Two times a day (BID) | CUTANEOUS | Status: DC

## 2011-07-02 MED ORDER — PRAVASTATIN SODIUM 80 MG PO TABS: 80.0000 mg | ORAL_TABLET | Freq: Every evening | ORAL | Status: DC

## 2011-07-02 NOTE — Progress Notes (Signed)
Subjective:    Patient ID: Kenneth Singleton, male    DOB: Oct 12, 1956, 55 y.o.   MRN: [id number]  Rash This is a chronic problem. The current episode started more than 1 year ago. The problem has been gradually worsening since onset. The affected locations include the left arm, right wrist and left upper leg. The rash is characterized by dryness and itchiness. He was exposed to nothing. Pertinent negatives include no anorexia, congestion, cough, diarrhea, eye pain, facial edema, fatigue, fever, joint pain, nail changes, rhinorrhea, shortness of breath, sore throat or vomiting. Past treatments include nothing. His past medical history is significant for eczema. There is no history of allergies, asthma or varicella.  Hyperlipidemia This is a chronic problem. The current episode started more than 1 year ago. The problem is uncontrolled. Recent lipid tests were reviewed and are variable. Exacerbating diseases include obesity. He has no history of chronic renal disease, diabetes, hypothyroidism, liver disease or nephrotic syndrome. Pertinent negatives include no chest pain, focal sensory loss, focal weakness, leg pain, myalgias or shortness of breath. Current antihyperlipidemic treatment includes statins. The current treatment provides no improvement of lipids. Compliance problems include adherence to exercise, medication cost and adherence to diet.       Review of Systems  Constitutional: Negative for fever, chills, diaphoresis, activity change, appetite change, fatigue and unexpected weight change.  HENT: Negative for congestion, sore throat, facial swelling, rhinorrhea, drooling, mouth sores, trouble swallowing, neck pain, neck stiffness, voice change and sinus pressure.   Eyes: Negative for photophobia, pain, discharge, redness, itching and visual disturbance.  Respiratory: Negative for apnea, cough, choking, chest tightness, shortness of breath, wheezing and stridor.   Cardiovascular: Negative for chest  pain, palpitations and leg swelling.  Gastrointestinal: Negative for nausea, vomiting, abdominal pain, diarrhea, constipation, blood in stool, rectal pain and anorexia.  Genitourinary: Negative for dysuria, urgency, frequency, hematuria, flank pain, decreased urine volume, discharge, penile swelling, scrotal swelling, enuresis, difficulty urinating, genital sores, penile pain and testicular pain.  Musculoskeletal: Negative for myalgias, back pain, joint pain, joint swelling, arthralgias and gait problem.  Skin: Positive for rash. Negative for nail changes, color change, pallor and wound.  Neurological: Negative for dizziness, tremors, focal weakness, seizures, syncope, facial asymmetry, speech difficulty, weakness, light-headedness, numbness and headaches.  Hematological: Negative for adenopathy. Does not bruise/bleed easily.  Psychiatric/Behavioral: Negative for suicidal ideas, hallucinations, behavioral problems, confusion, sleep disturbance, self-injury, dysphoric mood, decreased concentration and agitation. The patient is not nervous/anxious and is not hyperactive.        Objective:   Physical Exam  Vitals reviewed. Constitutional: He is oriented to person, place, and time. He appears well-developed and well-nourished. No distress.  HENT:  Head: Normocephalic and atraumatic.  Right Ear: External ear normal.  Left Ear: External ear normal.  Nose: Nose normal.  Mouth/Throat: Oropharynx is clear and moist. No oropharyngeal exudate.  Eyes: Conjunctivae and EOM are normal. Pupils are equal, round, and reactive to light. Right eye exhibits no discharge. Left eye exhibits no discharge. No scleral icterus.  Neck: Normal range of motion. Neck supple. No JVD present. No tracheal deviation present. No thyromegaly present.  Cardiovascular: Normal rate, regular rhythm, normal heart sounds and intact distal pulses.  Exam reveals no gallop and no friction rub.   No murmur heard. Pulmonary/Chest: Effort  normal and breath sounds normal. No stridor. No respiratory distress. He has no wheezes. He has no rales. He exhibits no tenderness.  Abdominal: Soft. Bowel sounds are normal. He exhibits no distension  and no mass. There is no tenderness. There is no rebound and no guarding. Hernia confirmed negative in the right inguinal area and confirmed negative in the left inguinal area.  Genitourinary: Rectum normal, prostate normal, testes normal and penis normal. Rectal exam shows no external hemorrhoid, no internal hemorrhoid, no fissure, no mass, no tenderness and anal tone normal. Guaiac negative stool. Prostate is not enlarged and not tender. Right testis shows no mass, no swelling and no tenderness. Right testis is descended. Cremasteric reflex is not absent on the right side. Left testis shows no mass, no swelling and no tenderness. Left testis is descended. Cremasteric reflex is not absent on the left side. Circumcised. No penile erythema or penile tenderness. No discharge found.  Musculoskeletal: Normal range of motion. He exhibits no edema and no tenderness.  Lymphadenopathy:    He has no cervical adenopathy.       Right: No inguinal adenopathy present.       Left: No inguinal adenopathy present.  Neurological: He is alert and oriented to person, place, and time. He has normal reflexes. He displays normal reflexes. No cranial nerve deficit. He exhibits normal muscle tone. Coordination normal.  Skin: Skin is warm, dry and intact. Rash noted. No abrasion, no bruising, no burn, no ecchymosis, no laceration, no lesion, no petechiae and no purpura noted. Rash is papular. Rash is not macular, not maculopapular, not nodular, not pustular, not vesicular and not urticarial. He is not diaphoretic. No cyanosis or erythema. No pallor. Nails show no clubbing.          He has patches of dry skin with erythema, papules, excoriation, and scale, they are well demarcated with no pustules, streaking, exudates, or warmth    Psychiatric: He has a normal mood and affect. His behavior is normal. Judgment and thought content normal.     Lab Results  Component Value Date   WBC 4.8 02/13/2010   HGB 14.7 02/13/2010   HCT 44.1 02/13/2010   PLT 259.0 02/13/2010   CHOL 206* 05/08/2010   TRIG 103.0 05/08/2010   HDL 66.60 05/08/2010   LDLDIRECT 118.0 05/08/2010   ALT 30 05/08/2010   AST 22 05/08/2010   NA 142 05/08/2010   K 4.7 05/08/2010   CL 110 05/08/2010   CREATININE 0.8 05/08/2010   BUN 19 05/08/2010   CO2 26 05/08/2010   TSH 1.39 02/13/2010   PSA 0.36 02/13/2010       Assessment & Plan:

## 2011-07-02 NOTE — Assessment & Plan Note (Signed)
No changes wrt this

## 2011-07-02 NOTE — Assessment & Plan Note (Signed)
Changes to pravachol at his request

## 2011-07-02 NOTE — Patient Instructions (Signed)
Hypercholesterolemia High Blood Cholesterol Cholesterol is a white, waxy, fat-like protein needed by your body in small amounts. The liver makes all the cholesterol you need. It is carried from the liver by the blood through the blood vessels. Deposits (plaque) may build up on blood vessel walls. This makes the arteries narrower and stiffer. Plaque increases the risk for heart attack and stroke. You cannot feel your cholesterol level even if it is very high. The only way to know is by a blood test to check your lipid (fats) levels. Once you know your cholesterol levels, you should keep a record of the test results. Work with your caregiver to to keep your levels in the desired range. WHAT THE RESULTS MEAN:  Total cholesterol is a rough measure of all the cholesterol in your blood.   LDL is the so-called bad cholesterol. This is the type that deposits cholesterol in the walls of the arteries. You want this level to be low.   HDL is the good cholesterol because it cleans the arteries and carries the LDL away. You want this level to be high.   Triglycerides are fat that the body can either burn for energy or store. High levels are closely linked to heart disease.  DESIRED LEVELS:  Total cholesterol below 200.   LDL below 100 for people at risk, below 70 for very high risk.   HDL above 50 is good, above 60 is best.   Triglycerides below 150.  HOW TO LOWER YOUR CHOLESTEROL:  Diet.   Choose fish or white meat chicken and [place], roasted or baked. Limit fatty cuts of red meat, fried foods, and processed meats, such as sausage and lunch meat.   Eat lots of fresh fruits and vegetables. Choose whole grains, beans, pasta, potatoes and cereals.   Use only small amounts of olive, corn or canola oils. Avoid butter, mayonnaise, shortening or palm kernel oils. Avoid foods with trans-fats.   Use skim/nonfat milk and low-fat/nonfat yogurt and cheeses. Avoid whole milk, cream, ice cream, egg yolks and  cheeses. Healthy desserts include angel food cake, gingersnaps, animal crackers, hard candy, popsicles, and low-fat/nonfat frozen yogurt. Avoid pastries, cakes, pies and cookies.   Exercise.   A regular program helps decrease LDL and raises HDL.   Helps with weight control.   Do things that increase your activity level like gardening, walking, or taking the stairs.   Medication.   May be prescribed by your caregiver to help lowering cholesterol and the risk for heart disease.   You may need medicine even if your levels are normal if you have several risk factors.  HOME CARE INSTRUCTIONS  Follow your diet and exercise programs as suggested by your caregiver.   Take medications as directed.   Have blood work done when your caregiver feels it is necessary.  MAKE SURE YOU:   Understand these instructions.   Will watch your condition.   Will get help right away if you are not doing well or get worse.  Document Released: 11/15/2005 Document Re-Released: 10/28/2008 [facility] Patient Information 2011 [place].Eczema / Atopic Dermatitis Atopic dermatitis, or eczema, is an inherited type of sensitive skin. Often people with eczema have a family history of allergies, asthma, or hay fever. It causes a red itchy rash and dry scaly skin. The itchiness may occur before the skin rash and may be very intense. It is not contagious. Eczema is generally worse during the cooler winter months and often improves with the warmth of summer.  Eczema usually starts showing signs in infancy. Some children outgrow eczema, but it may last through adulthood. Flare-ups may be caused by:  Eating something or contact with something you are sensitive or allergic to.   Stress.  DIAGNOSIS The diagnosis of eczema is usually based upon symptoms and medical history. TREATMENT Eczema cannot be cured, but symptoms usually can be controlled with treatment or avoidance of allergens (things to which you are sensitive  or allergic to).  Controlling the itching and scratching.   Use over-the-counter antihistamines as directed for itching. It is especially useful at night when the itching tends to be worse.   Use over-the-counter steroid creams as directed for itching.   Scratching makes the rash and itching worse and may cause impetigo (a skin infection) if fingernails are contaminated (dirty).   Keeping the skin well moisturized with creams every day. This will seal in moisture and help prevent dryness. Lotions containing alcohol and water can dry the skin and are not recommended.   Limiting exposure to allergens.   Recognizing situations that cause stress.   Developing a plan to manage stress.  HOME CARE INSTRUCTIONS  Take prescription and over-the-counter medicines as directed by your caregiver.   Do not use anything on the skin without checking with your caregiver.   Keep baths or showers short (5 minutes) in warm (not hot) water. Use mild cleansers for bathing. You may add non-perfumed bath oil to the bath water. It is best to avoid soap and bubble bath.   Immediately after a bath or shower, when the skin is still damp, apply a moisturizing ointment to the entire body. This ointment should be a petroleum ointment. This will seal in moisture and help prevent dryness. The thicker the ointment the better. These should be unscented.   Keep fingernails cut short and wash hands often. If your child has eczema, it may be necessary to put soft gloves or mittens on your child at night.   Dress in clothes made of cotton or cotton blends. Dress lightly, as heat increases itching.   Avoid foods that may cause flare-ups. Common foods include cow's milk, peanut butter, eggs and wheat.   Keep a child with eczema away from anyone with fever blisters. The virus that causes fever blisters (herpes simplex) can cause a serious skin infection in children with eczema.  SEEK MEDICAL CARE IF:  Itching interferes  with sleep.   The rash gets worse or is not better within one week following treatment.   The rash looks infected (pus or soft yellow scabs).   You or your child has an oral temperature above 102 F (38.9 C).   Your baby is older than 3 months with a rectal temperature of 100.5 F (38.1 C) or higher for more than 1 day.   The rash flares up after contact with someone who has fever blisters.  SEEK IMMEDIATE MEDICAL CARE IF:  Your baby is older than 3 months with a rectal temperature of 102 F (38.9 C) or higher.   Your baby is older than 3 months or younger with a rectal temperature of 100.4 F (38 C) or higher.  Document Released: 11/12/2000 Document Re-Released: 02/09/2010 [facility] Patient Information 2011 [place].Health Maintenance in [place] a healthy diet and normal weight. Increased weight leads to problems with blood pressure and diabetes. Decrease fat in the diet and increase exercise. Obtain a proper diet from your caregiver if necessary.   High blood  pressure causes heart and blood vessel problems. Check blood pressures regularly and keep your blood pressure at normal limits. Aerobic exercise helps this. Persistent elevations of blood pressure should be treated with medications if weight loss and exercise are ineffective.   Avoid smoking, drinking in excess (more than 2 drinks per day), or use of street drugs. Do not share needles with anyone. Ask for help if you need assistance or instructions on stopping the use of alcohol, cigarettes, or drugs.   Maintain normal blood lipids and cholesterol. Your caregiver can give you information to lower your risk of heart disease or stroke.   Ask your caregiver if you are in need of early heart disease screening because of a strong family history of heart disease or signs of elevated testosterone (male sex hormone) levels. These can predispose you to early heart disease.   Practice safe  sex. Practicing safe sex decreases your risk for a sexually transmitted infection (STI). Some of the STIs are gonorrhea, chlamydia, syphilis, trichimonas, herpes, human papillomavirus (HPV), and human immunodeficiency virus (HIV). Herpes, HIV, and HPV are viral illnesses that have no cure. These can result in disability, cancer, and death.   It is not safe for someone who has AIDS or is HIV positive to have unprotected sex with a partner who is HIV positive. The reason for this is the fact that there are many different strains of HIV. If you have a strain that is readily treated with medications and then suddenly introduce a strain from a partner that has no further treatment options, you may suddenly have a strain of HIV that is untreatable. Even if you are both positive for HIV, it is still necessary to practice safe sex.   Use sunscreen with a SPF of 15 or greater. Being outside in the sun when your shadow caused by the sun is shorter than you are, means you are being exposed to sun at greater intensity. Lighter skinned people are at a greater risk of skin cancer.   Keep carbon monoxide and smoke detectors in your home and functioning at all times. Change the batteries every 6 months.   Do monthly examinations of your testicles. The best time to do this is after a hot shower or bath when the tissues are loose. Notify your caregivers of any lumps, tenderness, or changes in size or shape.   Notify your caregiver of new moles or changes in moles, especially if there is a change in shape or color. Also notify your caregiver if a mole is larger than the size of a pencil eraser.   Stay current with your tetanus shots and other required immunizations.  The Body Mass Index (BMI) is a way of measuring how much of your body is fat. Having a BMI above 27 increases the risk of heart disease, diabetes, hypertension, stroke, and other problems related to obesity. Document Released: 05/13/2008 Document  Re-Released: 05/05/2010 [facility] Patient Information 2011 [place].

## 2011-07-02 NOTE — Assessment & Plan Note (Signed)
Exam looks good, labs ordered, pt ed material was given, I asked him to stop drinking

## 2011-07-02 NOTE — Assessment & Plan Note (Signed)
Try elidel cream

## 2011-07-02 NOTE — Assessment & Plan Note (Signed)
BP is well controlled, I will check his lytes and renal function

## 2011-07-05 ENCOUNTER — Telehealth: Payer: Self-pay | Admitting: *Deleted

## 2011-07-05 DIAGNOSIS — L309 Dermatitis, unspecified: Secondary | ICD-10-CM

## 2011-07-05 DIAGNOSIS — E785 Hyperlipidemia, unspecified: Secondary | ICD-10-CM

## 2011-07-05 NOTE — Telephone Encounter (Signed)
Patient requesting a call back regarding RF's that need to go to [facility].

## 2011-07-06 MED ORDER — PREGABALIN 75 MG PO CAPS: 75.0000 mg | ORAL_CAPSULE | Freq: Two times a day (BID) | ORAL | Status: DC

## 2011-07-06 MED ORDER — PIMECROLIMUS 1 % EX CREA: TOPICAL_CREAM | Freq: Two times a day (BID) | CUTANEOUS | Status: DC

## 2011-07-06 MED ORDER — PRAVASTATIN SODIUM 80 MG PO TABS: 80.0000 mg | ORAL_TABLET | Freq: Every evening | ORAL | Status: DC

## 2011-07-06 MED ORDER — CELECOXIB 200 MG PO CAPS: 200.0000 mg | ORAL_CAPSULE | Freq: Every day | ORAL | Status: DC

## 2011-07-06 MED ORDER — LOSARTAN POTASSIUM-HCTZ 100-12.5 MG PO TABS: 1.0000 | ORAL_TABLET | Freq: Every day | ORAL | Status: DC

## 2011-07-06 NOTE — Telephone Encounter (Signed)
Spoke with wife and rx sent via escript

## 2011-07-31 ENCOUNTER — Other Ambulatory Visit: Payer: Self-pay | Admitting: Internal Medicine

## 2011-10-05 ENCOUNTER — Ambulatory Visit (INDEPENDENT_AMBULATORY_CARE_PROVIDER_SITE_OTHER): Payer: BC Managed Care – PPO | Admitting: Internal Medicine

## 2011-10-05 ENCOUNTER — Other Ambulatory Visit: Payer: Self-pay | Admitting: Internal Medicine

## 2011-10-05 ENCOUNTER — Encounter: Payer: Self-pay | Admitting: Internal Medicine

## 2011-10-05 ENCOUNTER — Other Ambulatory Visit (INDEPENDENT_AMBULATORY_CARE_PROVIDER_SITE_OTHER): Payer: BC Managed Care – PPO

## 2011-10-05 DIAGNOSIS — I10 Essential (primary) hypertension: Secondary | ICD-10-CM

## 2011-10-05 DIAGNOSIS — E785 Hyperlipidemia, unspecified: Secondary | ICD-10-CM

## 2011-10-05 DIAGNOSIS — Z23 Encounter for immunization: Secondary | ICD-10-CM

## 2011-10-05 LAB — COMPREHENSIVE METABOLIC PANEL
ALT: 39 U/L (ref 0–53)
Albumin: 4.4 g/dL (ref 3.5–5.2)
Alkaline Phosphatase: 59 U/L (ref 39–117)
CO2: 29 mEq/L (ref 19–32)
GFR: 81.61 mL/min (ref >60.00)
Glucose, Bld: 104 mg/dL — ABNORMAL HIGH (ref 70–99)
Potassium: 4.4 mEq/L (ref 3.5–5.1)
Sodium: 138 mEq/L (ref 135–145)
Total Bilirubin: 0.6 mg/dL (ref 0.3–1.2)
Total Protein: 7.5 g/dL (ref 6.0–8.3)

## 2011-10-05 LAB — LIPID PANEL
Total CHOL/HDL Ratio: 4
Triglycerides: 258 mg/dL — ABNORMAL HIGH (ref 0.0–149.0)

## 2011-10-05 NOTE — Patient Instructions (Signed)

## 2011-10-05 NOTE — Progress Notes (Signed)
Subjective:    Patient ID: Kenneth Singleton, male    DOB: 07/08/1956, 55 y.o.   MRN: [id number]  Hypertension This is a chronic problem. The current episode started more than 1 year ago. The problem has been gradually improving since onset. The problem is controlled. Pertinent negatives include no anxiety, blurred vision, chest pain, headaches, malaise/fatigue, neck pain, orthopnea, palpitations, peripheral edema, PND, shortness of breath or sweats. There are no associated agents to hypertension. Past treatments include angiotensin blockers and diuretics. The current treatment provides significant improvement. Compliance problems include exercise and diet.  There is no history of chronic renal disease.  Hyperlipidemia This is a chronic problem. The current episode started more than 1 year ago. The problem is controlled. Recent lipid tests were reviewed and are variable. He has no history of chronic renal disease, diabetes, hypothyroidism, liver disease, obesity or nephrotic syndrome. Pertinent negatives include no chest pain, focal sensory loss, focal weakness, leg pain, myalgias or shortness of breath. Current antihyperlipidemic treatment includes statins. The current treatment provides moderate improvement of lipids. Compliance problems include adherence to exercise and adherence to diet.       Review of Systems  Constitutional: Negative.  Negative for malaise/fatigue.  HENT: Negative.  Negative for neck pain.   Eyes: Negative.  Negative for blurred vision.  Respiratory: Negative.  Negative for shortness of breath.   Cardiovascular: Negative.  Negative for chest pain, palpitations, orthopnea and PND.  Gastrointestinal: Negative.   Genitourinary: Negative.   Musculoskeletal: Negative.  Negative for myalgias.  Skin: Negative.   Neurological: Negative.  Negative for focal weakness and headaches.  Hematological: Negative.   Psychiatric/Behavioral: Negative.        Objective:   Physical Exam    Vitals reviewed. Constitutional: He is oriented to person, place, and time. He appears well-developed and well-nourished. No distress.  HENT:  Head: Normocephalic and atraumatic.  Mouth/Throat: Oropharynx is clear and moist. No oropharyngeal exudate.  Eyes: Right eye exhibits no discharge. Left eye exhibits no discharge. No scleral icterus.  Neck: Normal range of motion. Neck supple. No JVD present. No tracheal deviation present. No thyromegaly present.  Cardiovascular: Normal rate, regular rhythm, normal heart sounds and intact distal pulses.  Exam reveals no gallop and no friction rub.   No murmur heard. Pulmonary/Chest: Effort normal and breath sounds normal. No stridor. No respiratory distress. He has no wheezes. He has no rales. He exhibits no tenderness.  Abdominal: Soft. Bowel sounds are normal. He exhibits no distension and no mass. There is no tenderness. There is no rebound and no guarding.  Musculoskeletal: Normal range of motion. He exhibits no edema and no tenderness.  Lymphadenopathy:    He has no cervical adenopathy.  Neurological: He is oriented to person, place, and time. He displays normal reflexes. He exhibits normal muscle tone. Coordination normal.  Skin: Skin is warm and dry. No rash noted. He is not diaphoretic. No erythema. No pallor.  Psychiatric: He has a normal mood and affect. His behavior is normal. Judgment and thought content normal.      Lab Results  Component Value Date   WBC 4.9 07/02/2011   HGB 15.0 07/02/2011   HCT 43.8 07/02/2011   PLT 229.0 07/02/2011   GLUCOSE 97 07/02/2011   CHOL 260* 07/02/2011   TRIG 289.0* 07/02/2011   HDL 50.70 07/02/2011   LDLDIRECT 149.5 07/02/2011   LDLCALC 99 02/13/2010   ALT 48 07/02/2011   AST 35 07/02/2011   NA 137 07/02/2011  K 4.4 07/02/2011   CL 101 07/02/2011   CREATININE 0.9 07/02/2011   BUN 21 07/02/2011   CO2 28 07/02/2011   TSH 1.66 07/02/2011   PSA 0.32 07/02/2011      Assessment & Plan:

## 2011-10-05 NOTE — Assessment & Plan Note (Signed)
He is doing well on pravachol, I will recheck his FLP today and will monitor his LFT's

## 2011-10-05 NOTE — Progress Notes (Signed)
Addended by: Estell Harpin T on: 10/05/2011 08:55 AM   Modules accepted: Orders

## 2011-10-05 NOTE — Assessment & Plan Note (Signed)
His BP is well controlled, I will check his lytes and renal function today 

## 2011-10-06 ENCOUNTER — Encounter: Payer: Self-pay | Admitting: Internal Medicine

## 2012-03-22 ENCOUNTER — Other Ambulatory Visit: Payer: Self-pay

## 2012-03-22 MED ORDER — PREGABALIN 75 MG PO CAPS: 75.0000 mg | ORAL_CAPSULE | Freq: Two times a day (BID) | ORAL | Status: DC

## 2012-03-22 NOTE — Telephone Encounter (Signed)
Pt's spouse called requesting refill of Lyrica to [facility], please advise.

## 2012-03-23 NOTE — Telephone Encounter (Signed)
Rx faxed to pharmacy  

## 2012-07-08 ENCOUNTER — Other Ambulatory Visit: Payer: Self-pay | Admitting: Internal Medicine

## 2012-08-02 ENCOUNTER — Telehealth: Payer: Self-pay | Admitting: Internal Medicine

## 2012-08-02 ENCOUNTER — Other Ambulatory Visit: Payer: Self-pay | Admitting: Internal Medicine

## 2012-08-02 DIAGNOSIS — I10 Essential (primary) hypertension: Secondary | ICD-10-CM

## 2012-08-02 DIAGNOSIS — E785 Hyperlipidemia, unspecified: Secondary | ICD-10-CM

## 2012-08-02 MED ORDER — LOSARTAN POTASSIUM-HCTZ 100-12.5 MG PO TABS: 1.0000 | ORAL_TABLET | Freq: Every day | ORAL | Status: DC

## 2012-08-02 MED ORDER — PRAVASTATIN SODIUM 80 MG PO TABS: 80.0000 mg | ORAL_TABLET | Freq: Every evening | ORAL | Status: DC

## 2012-08-02 NOTE — Telephone Encounter (Signed)
Pt scheduled his physical for 08/21/2012 and needs a refill on losartan and pravastatin sent to his local pharmacy, [facility] [place], to last until then if possible  Thanks

## 2012-08-02 NOTE — Telephone Encounter (Signed)
done

## 2012-08-02 NOTE — Telephone Encounter (Signed)
Pt scheduled his physical for 08/21/2012 and needs a refill sent to his local pharmacy to last

## 2012-08-21 ENCOUNTER — Ambulatory Visit (INDEPENDENT_AMBULATORY_CARE_PROVIDER_SITE_OTHER): Payer: BC Managed Care – PPO | Admitting: Internal Medicine

## 2012-08-21 ENCOUNTER — Other Ambulatory Visit (INDEPENDENT_AMBULATORY_CARE_PROVIDER_SITE_OTHER): Payer: BC Managed Care – PPO

## 2012-08-21 ENCOUNTER — Encounter: Payer: Self-pay | Admitting: Internal Medicine

## 2012-08-21 VITALS — BP 110/68 | HR 82 | Temp 98.1°F | Resp 16 | Wt 204.0 lb

## 2012-08-21 DIAGNOSIS — R7309 Other abnormal glucose: Secondary | ICD-10-CM

## 2012-08-21 DIAGNOSIS — Z0000 Encounter for general adult medical examination without abnormal findings: Secondary | ICD-10-CM

## 2012-08-21 DIAGNOSIS — Z136 Encounter for screening for cardiovascular disorders: Secondary | ICD-10-CM

## 2012-08-21 DIAGNOSIS — I10 Essential (primary) hypertension: Secondary | ICD-10-CM

## 2012-08-21 DIAGNOSIS — E785 Hyperlipidemia, unspecified: Secondary | ICD-10-CM

## 2012-08-21 LAB — CBC WITH DIFFERENTIAL/PLATELET
Basophils Absolute: 0 10*3/uL (ref 0.0–0.1)
Basophils Relative: 0.5 % (ref 0.0–3.0)
Eosinophils Absolute: 0.1 10*3/uL (ref 0.0–0.7)
Eosinophils Relative: 1.5 % (ref 0.0–5.0)
HCT: 45.8 % (ref 39.0–52.0)
Hemoglobin: 15.6 g/dL (ref 13.0–17.0)
Lymphocytes Relative: 17.4 % (ref 12.0–46.0)
Lymphs Abs: 1 10*3/uL (ref 0.7–4.0)
MCHC: 34.2 g/dL (ref 30.0–36.0)
MCV: 97.9 fl (ref 78.0–100.0)
Monocytes Absolute: 0.5 10*3/uL (ref 0.1–1.0)
Monocytes Relative: 8.5 % (ref 3.0–12.0)
Neutro Abs: 4.1 10*3/uL (ref 1.4–7.7)
Neutrophils Relative %: 72.1 % (ref 43.0–77.0)
Platelets: 251 10*3/uL (ref 150.0–400.0)
RBC: 4.68 Mil/uL (ref 4.22–5.81)
RDW: 12.2 % (ref 11.5–14.6)
WBC: 5.8 10*3/uL (ref 4.5–10.5)

## 2012-08-21 LAB — URINALYSIS, ROUTINE W REFLEX MICROSCOPIC
Bilirubin Urine: NEGATIVE
Hgb urine dipstick: NEGATIVE
Ketones, ur: NEGATIVE
Leukocytes, UA: NEGATIVE
Nitrite: NEGATIVE
Specific Gravity, Urine: 1.025 (ref 1.000–1.030)
Total Protein, Urine: NEGATIVE
Urine Glucose: NEGATIVE
Urobilinogen, UA: 0.2 (ref 0.0–1.0)
pH: 5.5 (ref 5.0–8.0)

## 2012-08-21 LAB — HEMOGLOBIN A1C: Hgb A1c MFr Bld: 4.9 % (ref 4.6–6.5)

## 2012-08-21 NOTE — Progress Notes (Signed)
Subjective:    Patient ID: Kenneth Singleton, male    DOB: October 02, 1956, 56 y.o.   MRN: [id number]  Hypertension This is a chronic problem. The current episode started more than 1 year ago. The problem has been gradually improving since onset. The problem is controlled. Pertinent negatives include no anxiety, blurred vision, chest pain, headaches, malaise/fatigue, neck pain, orthopnea, palpitations, peripheral edema, PND, shortness of breath or sweats. Agents associated with hypertension include NSAIDs. Past treatments include ACE inhibitors and diuretics. The current treatment provides significant improvement. Compliance problems include exercise and diet.       Review of Systems  Constitutional: Negative.  Negative for malaise/fatigue.  HENT: Negative.  Negative for neck pain.   Eyes: Negative.  Negative for blurred vision.  Respiratory: Negative for cough, chest tightness, shortness of breath, wheezing and stridor.   Cardiovascular: Negative for chest pain, palpitations, orthopnea, leg swelling and PND.  Gastrointestinal: Negative for nausea, vomiting, abdominal pain, diarrhea, constipation and blood in stool.  Genitourinary: Negative.   Musculoskeletal: Negative for myalgias, back pain, joint swelling, arthralgias and gait problem.  Skin: Negative for color change, pallor, rash and wound.  Neurological: Negative for dizziness, tremors, seizures, syncope, facial asymmetry, speech difficulty, weakness, light-headedness, numbness and headaches.  Hematological: Negative for adenopathy. Does not bruise/bleed easily.  Psychiatric/Behavioral: Negative.        Objective:   Physical Exam  Vitals reviewed. Constitutional: He is oriented to person, place, and time. He appears well-developed and well-nourished. No distress.  HENT:  Head: Normocephalic and atraumatic.  Mouth/Throat: Oropharynx is clear and moist. No oropharyngeal exudate.  Eyes: Conjunctivae normal are normal. Right eye exhibits no  discharge. Left eye exhibits no discharge. No scleral icterus.  Neck: Normal range of motion. Neck supple. No JVD present. No tracheal deviation present. No thyromegaly present.  Cardiovascular: Normal rate, regular rhythm, normal heart sounds and intact distal pulses.  Exam reveals no gallop and no friction rub.   No murmur heard. Pulmonary/Chest: Effort normal and breath sounds normal. No stridor. No respiratory distress. He has no wheezes. He has no rales. He exhibits no tenderness.  Abdominal: Soft. Bowel sounds are normal. He exhibits no distension and no mass. There is no tenderness. There is no rebound and no guarding. Hernia confirmed negative in the right inguinal area and confirmed negative in the left inguinal area.  Genitourinary: Rectum normal, prostate normal, testes normal and penis normal. Rectal exam shows no external hemorrhoid, no internal hemorrhoid, no fissure, no mass, no tenderness and anal tone normal. Guaiac negative stool. Prostate is not enlarged and not tender. Right testis shows no mass, no swelling and no tenderness. Right testis is descended. Left testis shows no mass, no swelling and no tenderness. Left testis is descended. Circumcised. No penile erythema or penile tenderness. No discharge found.  Musculoskeletal: Normal range of motion. He exhibits no edema and no tenderness.  Lymphadenopathy:    He has no cervical adenopathy.       Right: No inguinal adenopathy present.       Left: No inguinal adenopathy present.  Neurological: He is oriented to person, place, and time.  Skin: Skin is warm and dry. No rash noted. He is not diaphoretic. No erythema. No pallor.  Psychiatric: He has a normal mood and affect. His behavior is normal. Judgment and thought content normal.      Lab Results  Component Value Date   WBC 4.9 07/02/2011   HGB 15.0 07/02/2011   HCT 43.8 07/02/2011  PLT 229.0 07/02/2011   GLUCOSE 104* 10/05/2011   CHOL 237* 10/05/2011   TRIG 258.0* 10/05/2011    HDL 61.60 10/05/2011   LDLDIRECT 135.1 10/05/2011   LDLCALC 99 02/13/2010   ALT 39 10/05/2011   AST 30 10/05/2011   NA 138 10/05/2011   K 4.4 10/05/2011   CL 102 10/05/2011   CREATININE 1.0 10/05/2011   BUN 18 10/05/2011   CO2 29 10/05/2011   TSH 1.66 07/02/2011   PSA 0.32 07/02/2011      Assessment & Plan:

## 2012-08-21 NOTE — Patient Instructions (Signed)
Health Maintenance, Males A healthy lifestyle and preventative care can promote health and wellness.  Maintain regular health, dental, and eye exams.   Eat a healthy diet. Foods like vegetables, fruits, whole grains, low-fat dairy products, and lean protein foods contain the nutrients you need without too many calories. Decrease your intake of foods high in solid fats, added sugars, and salt. Get information about a proper diet from your caregiver, if necessary.   Regular physical exercise is one of the most important things you can do for your health. Most adults should get at least 150 minutes of moderate-intensity exercise (any activity that increases your heart rate and causes you to sweat) each week. In addition, most adults need muscle-strengthening exercises on 2 or more days a week.    Maintain a healthy weight. The body mass index (BMI) is a screening tool to identify possible weight problems. It provides an estimate of body fat based on height and weight. Your caregiver can help determine your BMI, and can help you achieve or maintain a healthy weight. For adults 20 years and older:   A BMI below 18.5 is considered underweight.   A BMI of 18.5 to 24.9 is normal.   A BMI of 25 to 29.9 is considered overweight.   A BMI of 30 and above is considered obese.   Maintain normal blood lipids and cholesterol by exercising and minimizing your intake of saturated fat. Eat a balanced diet with plenty of fruits and vegetables. Blood tests for lipids and cholesterol should begin at age 20 and be repeated every 5 years. If your lipid or cholesterol levels are high, you are over 50, or you are a high risk for heart disease, you may need your cholesterol levels checked more frequently.Ongoing high lipid and cholesterol levels should be treated with medicines, if diet and exercise are not effective.   If you smoke, find out from your caregiver how to quit. If you do not use tobacco, do not start.    If you choose to drink alcohol, do not exceed 2 drinks per day. One drink is considered to be 12 ounces (355 mL) of beer, 5 ounces (148 mL) of wine, or 1.5 ounces (44 mL) of liquor.   Avoid use of street drugs. Do not share needles with anyone. Ask for help if you need support or instructions about stopping the use of drugs.   High blood pressure causes heart disease and increases the risk of stroke. Blood pressure should be checked at least every 1 to 2 years. Ongoing high blood pressure should be treated with medicines if weight loss and exercise are not effective.   If you are 45 to 55 years old, ask your caregiver if you should take aspirin to prevent heart disease.   Diabetes screening involves taking a blood sample to check your fasting blood sugar level. This should be done once every 3 years, after age 45, if you are within normal weight and without risk factors for diabetes. Testing should be considered at a younger age or be carried out more frequently if you are overweight and have at least 1 risk factor for diabetes.   Colorectal cancer can be detected and often prevented. Most routine colorectal cancer screening begins at the age of 50 and continues through age 75. However, your caregiver may recommend screening at an earlier age if you have risk factors for colon cancer. On a yearly basis, your caregiver may provide home test kits to check for hidden   blood in the stool. Use of a small camera at the end of a tube, to directly examine the colon (sigmoidoscopy or colonoscopy), can detect the earliest forms of colorectal cancer. Talk to your caregiver about this at age 50, when routine screening begins. Direct examination of the colon should be repeated every 5 to 10 years through age 75, unless early forms of pre-cancerous polyps or small growths are found.   Hepatitis C blood testing is recommended for all people born from 1945 through 1965 and any individual with known risks for  hepatitis C.   Healthy men should no longer receive prostate-specific antigen (PSA) blood tests as part of routine cancer screening. Consult with your caregiver about prostate cancer screening.   Testicular cancer screening is not recommended for adolescents or adult males who have no symptoms. Screening includes self-exam, caregiver exam, and other screening tests. Consult with your caregiver about any symptoms you have or any concerns you have about testicular cancer.   Practice safe sex. Use condoms and avoid high-risk sexual practices to reduce the spread of sexually transmitted infections (STIs).   Use sunscreen with a sun protection factor (SPF) of 30 or greater. Apply sunscreen liberally and repeatedly throughout the day. You should seek shade when your shadow is shorter than you. Protect yourself by wearing long sleeves, pants, a wide-brimmed hat, and sunglasses year round, whenever you are outdoors.   Notify your caregiver of new moles or changes in moles, especially if there is a change in shape or color. Also notify your caregiver if a mole is larger than the size of a pencil eraser.   A one-time screening for abdominal aortic aneurysm (AAA) and surgical repair of large AAAs by sound wave imaging (ultrasonography) is recommended for ages 65 to 75 years who are current or former smokers.   Stay current with your immunizations.  Document Released: 05/13/2008 Document Revised: 11/04/2011 Document Reviewed: 04/12/2011 Exit[employer] Patient Information 2012 [employer]. 

## 2012-08-21 NOTE — Assessment & Plan Note (Signed)
Exam done, vaccines were reviewed, labs ordered, pt ed material was given

## 2012-08-21 NOTE — Assessment & Plan Note (Signed)
I will check his FLP today

## 2012-08-21 NOTE — Assessment & Plan Note (Signed)
His BP is well controlled, I will check his lytes and renal function 

## 2012-08-21 NOTE — Assessment & Plan Note (Signed)
I will check his A1C to see if he has developed DM II

## 2012-08-22 ENCOUNTER — Encounter: Payer: Self-pay | Admitting: Internal Medicine

## 2012-08-22 LAB — COMPREHENSIVE METABOLIC PANEL WITH GFR
ALT: 67 U/L — ABNORMAL HIGH (ref 0–53)
AST: 46 U/L — ABNORMAL HIGH (ref 0–37)
Albumin: 4.5 g/dL (ref 3.5–5.2)
Alkaline Phosphatase: 65 U/L (ref 39–117)
BUN: 15 mg/dL (ref 6–23)
CO2: 29 meq/L (ref 19–32)
Calcium: 9.8 mg/dL (ref 8.4–10.5)
Chloride: 100 meq/L (ref 96–112)
Creatinine, Ser: 0.9 mg/dL (ref 0.4–1.5)
GFR: 89.48 mL/min
Glucose, Bld: 87 mg/dL (ref 70–99)
Potassium: 4.6 meq/L (ref 3.5–5.1)
Sodium: 140 meq/L (ref 135–145)
Total Bilirubin: 1 mg/dL (ref 0.3–1.2)
Total Protein: 7.4 g/dL (ref 6.0–8.3)

## 2012-08-22 LAB — LIPID PANEL
HDL: 43.8 mg/dL (ref >39.00)
VLDL: 69.6 mg/dL — ABNORMAL HIGH (ref 0.0–40.0)

## 2012-08-22 LAB — TSH: TSH: 2.48 u[IU]/mL (ref 0.35–5.50)

## 2012-08-22 LAB — LDL CHOLESTEROL, DIRECT: Direct LDL: 128.5 mg/dL

## 2012-09-06 ENCOUNTER — Telehealth: Payer: Self-pay | Admitting: Internal Medicine

## 2012-09-06 DIAGNOSIS — I10 Essential (primary) hypertension: Secondary | ICD-10-CM

## 2012-09-06 DIAGNOSIS — E785 Hyperlipidemia, unspecified: Secondary | ICD-10-CM

## 2012-09-06 MED ORDER — CELECOXIB 200 MG PO CAPS: ORAL_CAPSULE | ORAL | Status: DC

## 2012-09-06 MED ORDER — PRAVASTATIN SODIUM 80 MG PO TABS: 80.0000 mg | ORAL_TABLET | Freq: Every evening | ORAL | Status: DC

## 2012-09-06 MED ORDER — PREGABALIN 75 MG PO CAPS: 75.0000 mg | ORAL_CAPSULE | Freq: Two times a day (BID) | ORAL | Status: DC

## 2012-09-06 MED ORDER — LOSARTAN POTASSIUM-HCTZ 100-12.5 MG PO TABS: 1.0000 | ORAL_TABLET | Freq: Every day | ORAL | Status: DC

## 2012-09-06 NOTE — Telephone Encounter (Signed)
RX sent via e-script

## 2012-09-06 NOTE — Telephone Encounter (Signed)
Pt needs refills sent to [facility] on [address].  He is out.  He needs all of them.  [phone] is the number to reach his wife.  She called yesterday morning.

## 2013-02-05 ENCOUNTER — Other Ambulatory Visit (INDEPENDENT_AMBULATORY_CARE_PROVIDER_SITE_OTHER): Payer: BC Managed Care – PPO

## 2013-02-05 ENCOUNTER — Ambulatory Visit: Payer: BC Managed Care – PPO | Admitting: Internal Medicine

## 2013-02-05 ENCOUNTER — Ambulatory Visit (INDEPENDENT_AMBULATORY_CARE_PROVIDER_SITE_OTHER): Payer: BC Managed Care – PPO | Admitting: Internal Medicine

## 2013-02-05 ENCOUNTER — Encounter: Payer: Self-pay | Admitting: Internal Medicine

## 2013-02-05 VITALS — BP 116/78 | HR 71 | Temp 97.6°F | Resp 16 | Wt 204.8 lb

## 2013-02-05 DIAGNOSIS — R0789 Other chest pain: Secondary | ICD-10-CM

## 2013-02-05 DIAGNOSIS — E785 Hyperlipidemia, unspecified: Secondary | ICD-10-CM

## 2013-02-05 DIAGNOSIS — I10 Essential (primary) hypertension: Secondary | ICD-10-CM

## 2013-02-05 DIAGNOSIS — R21 Rash and other nonspecific skin eruption: Secondary | ICD-10-CM

## 2013-02-05 DIAGNOSIS — R7402 Elevation of levels of lactic acid dehydrogenase (LDH): Secondary | ICD-10-CM

## 2013-02-05 LAB — CBC WITH DIFFERENTIAL/PLATELET
Eosinophils Absolute: 0.1 10*3/uL (ref 0.0–0.7)
Eosinophils Relative: 1.3 % (ref 0.0–5.0)
HCT: 43.5 % (ref 39.0–52.0)
Lymphs Abs: 1 10*3/uL (ref 0.7–4.0)
MCHC: 34.7 g/dL (ref 30.0–36.0)
MCV: 94.7 fl (ref 78.0–100.0)
Monocytes Absolute: 0.4 10*3/uL (ref 0.1–1.0)
Platelets: 262 10*3/uL (ref 150.0–400.0)
WBC: 5.6 10*3/uL (ref 4.5–10.5)

## 2013-02-05 LAB — TROPONIN I: Troponin I: <0.01 ng/mL (ref <0.06)

## 2013-02-05 LAB — COMPREHENSIVE METABOLIC PANEL
ALT: 32 U/L (ref 0–53)
BUN: 19 mg/dL (ref 6–23)
CO2: 27 mEq/L (ref 19–32)
Calcium: 9.4 mg/dL (ref 8.4–10.5)
Chloride: 103 mEq/L (ref 96–112)
Creatinine, Ser: 1 mg/dL (ref 0.4–1.5)
GFR: 85.09 mL/min (ref >60.00)
Glucose, Bld: 112 mg/dL — ABNORMAL HIGH (ref 70–99)

## 2013-02-05 LAB — CARDIAC PANEL: Relative Index: 1.9 calc (ref 0.0–2.5)

## 2013-02-05 LAB — LIPID PANEL
Cholesterol: 198 mg/dL (ref 0–200)
HDL: 48.9 mg/dL (ref >39.00)
Triglycerides: 230 mg/dL — ABNORMAL HIGH (ref 0.0–149.0)

## 2013-02-05 MED ORDER — CELECOXIB 200 MG PO CAPS: ORAL_CAPSULE | ORAL | Status: DC

## 2013-02-05 MED ORDER — LOSARTAN POTASSIUM-HCTZ 100-12.5 MG PO TABS: 1.0000 | ORAL_TABLET | Freq: Every day | ORAL | Status: DC

## 2013-02-05 MED ORDER — PRAVASTATIN SODIUM 80 MG PO TABS: 80.0000 mg | ORAL_TABLET | Freq: Every evening | ORAL | Status: DC

## 2013-02-05 MED ORDER — PREGABALIN 75 MG PO CAPS: 75.0000 mg | ORAL_CAPSULE | Freq: Two times a day (BID) | ORAL | Status: DC

## 2013-02-05 NOTE — Assessment & Plan Note (Signed)
Most likely cause for this is EtOH and/or fatty liver disease I will recheck his LFT's today and will screen him for Hep C

## 2013-02-05 NOTE — Assessment & Plan Note (Signed)
I think this is eczema I will do labs to screen for vasculitis, lyme disease

## 2013-02-05 NOTE — Patient Instructions (Addendum)
Chest Pain (Nonspecific) It is often hard to give a specific diagnosis for the cause of chest pain. There is always a chance that your pain could be related to something serious, such as a heart attack or a blood clot in the lungs. You need to follow up with your caregiver for further evaluation. CAUSES   Heartburn.  Pneumonia or bronchitis.  Anxiety or stress.  Inflammation around your heart (pericarditis) or lung (pleuritis or pleurisy).  A blood clot in the lung.  A collapsed lung (pneumothorax). It can develop suddenly on its own (spontaneous pneumothorax) or from injury (trauma) to the chest.  Shingles infection (herpes zoster virus). The chest wall is composed of bones, muscles, and cartilage. Any of these can be the source of the pain.  The bones can be bruised by injury.  The muscles or cartilage can be strained by coughing or overwork.  The cartilage can be affected by inflammation and become sore (costochondritis). DIAGNOSIS  Lab tests or other studies, such as X-rays, electrocardiography, stress testing, or cardiac imaging, may be needed to find the cause of your pain.  TREATMENT   Treatment depends on what may be causing your chest pain. Treatment may include:  Acid blockers for heartburn.  Anti-inflammatory medicine.  Pain medicine for inflammatory conditions.  Antibiotics if an infection is present.  You may be advised to change lifestyle habits. This includes stopping smoking and avoiding alcohol, caffeine, and chocolate.  You may be advised to keep your head raised (elevated) when sleeping. This reduces the chance of acid going backward from your stomach into your esophagus.  Most of the time, nonspecific chest pain will improve within 2 to 3 days with rest and mild pain medicine. HOME CARE INSTRUCTIONS   If antibiotics were prescribed, take your antibiotics as directed. Finish them even if you start to feel better.  For the next few days, avoid physical  activities that bring on chest pain. Continue physical activities as directed.  Do not smoke.  Avoid drinking alcohol.  Only take over-the-counter or prescription medicine for pain, discomfort, or fever as directed by your caregiver.  Follow your caregiver's suggestions for further testing if your chest pain does not go away.  Keep any follow-up appointments you made. If you do not go to an appointment, you could develop lasting (chronic) problems with pain. If there is any problem keeping an appointment, you must call to reschedule. SEEK MEDICAL CARE IF:   You think you are having problems from the medicine you are taking. Read your medicine instructions carefully.  Your chest pain does not go away, even after treatment.  You develop a rash with blisters on your chest. SEEK IMMEDIATE MEDICAL CARE IF:   You have increased chest pain or pain that spreads to your arm, neck, jaw, back, or abdomen.  You develop shortness of breath, an increasing cough, or you are coughing up blood.  You have severe back or abdominal pain, feel nauseous, or vomit.  You develop severe weakness, fainting, or chills.  You have a fever. THIS IS AN EMERGENCY. Do not wait to see if the pain will go away. Get medical help at once. Call your local emergency services (911 in [place] Do not drive yourself to the hospital. MAKE SURE YOU:   Understand these instructions.  Will watch your condition.  Will get help right away if you are not doing well or get worse. Document Released: 08/25/2005 Document Revised: 02/07/2012 Document Reviewed: 06/20/2008 Exit[employer] Patient Information 2013 [employer].  

## 2013-02-05 NOTE — Assessment & Plan Note (Signed)
I will check his a1c to see if he has developed DM II

## 2013-02-05 NOTE — Assessment & Plan Note (Signed)
EKG is normal today I will check his enzymes to look for ischemia He has risk factors for CAD so if his enzymes are negative then he will do an ETT

## 2013-02-05 NOTE — Progress Notes (Signed)
Subjective:    Patient ID: Kenneth Singleton, male    DOB: 13-Dec-1955, 57 y.o.   MRN: [id number]  Rash This is a recurrent problem. The current episode started more than 1 month ago. The problem is unchanged. The affected locations include the back and right lower leg. The rash is characterized by itchiness and dryness. Associated with: tick bite several months ago. Associated symptoms include shortness of breath. Pertinent negatives include no anorexia, congestion, cough, diarrhea, eye pain, facial edema, fatigue, fever, joint pain, nail changes, rhinorrhea, sore throat or vomiting. Past treatments include nothing. His past medical history is significant for allergies and eczema. There is no history of asthma or varicella.  Chest Pain  This is a recurrent problem. The current episode started more than 1 month ago. The onset quality is gradual. The problem occurs intermittently. The problem has been unchanged. The pain is present in the substernal region. The pain is at a severity of 1/10. The pain is mild. The quality of the pain is described as tightness. The pain does not radiate. Associated symptoms include shortness of breath. Pertinent negatives include no abdominal pain, back pain, claudication, cough, diaphoresis, dizziness, exertional chest pressure, fever, headaches, hemoptysis, irregular heartbeat, leg pain, lower extremity edema, malaise/fatigue, nausea, near-syncope, numbness, orthopnea, palpitations, PND, sputum production, syncope, vomiting or weakness. The pain is aggravated by nothing. He has tried nothing for the symptoms. Risk factors include alcohol intake, male gender, sedentary lifestyle and lack of exercise.      Review of Systems  Constitutional: Negative for fever, chills, malaise/fatigue, diaphoresis, activity change, appetite change, fatigue and unexpected weight change.  HENT: Negative.  Negative for congestion, sore throat and rhinorrhea.   Eyes: Negative.  Negative for pain.    Respiratory: Positive for shortness of breath. Negative for cough, hemoptysis and sputum production.   Cardiovascular: Positive for chest pain. Negative for palpitations, orthopnea, claudication, syncope, PND and near-syncope.  Gastrointestinal: Negative for nausea, vomiting, abdominal pain, diarrhea, constipation, blood in stool and anorexia.  Endocrine: Negative.   Genitourinary: Negative.   Musculoskeletal: Negative for myalgias, back pain, joint pain, joint swelling, arthralgias and gait problem.  Skin: Positive for rash. Negative for nail changes, color change, pallor and wound.  Allergic/Immunologic: Negative.   Neurological: Negative for dizziness, speech difficulty, weakness, light-headedness, numbness and headaches.  Hematological: Negative for adenopathy. Does not bruise/bleed easily.  Psychiatric/Behavioral: Negative.        Objective:   Physical Exam  Vitals reviewed. Constitutional: He is oriented to person, place, and time. He appears well-developed and well-nourished. No distress.  HENT:  Head: Normocephalic and atraumatic.  Mouth/Throat: Oropharynx is clear and moist. No oropharyngeal exudate.  Eyes: Conjunctivae are normal. Right eye exhibits no discharge. Left eye exhibits no discharge. No scleral icterus.  Neck: Normal range of motion. Neck supple. No JVD present. No tracheal deviation present. No thyromegaly present.  Cardiovascular: Normal rate, regular rhythm, normal heart sounds and intact distal pulses.  Exam reveals no gallop and no friction rub.   No murmur heard. Pulmonary/Chest: Effort normal and breath sounds normal. No stridor. No respiratory distress. He has no wheezes. He has no rales. He exhibits no tenderness.  Abdominal: Soft. Bowel sounds are normal. He exhibits no distension and no mass. There is no tenderness. There is no rebound and no guarding.  Musculoskeletal: Normal range of motion. He exhibits no edema and no tenderness.  Lymphadenopathy:     He has no cervical adenopathy.  Neurological: He is oriented to person,  place, and time.  Skin: Skin is warm, dry and intact. Lesion and rash noted. No abrasion, no bruising, no burn, no ecchymosis, no laceration, no petechiae and no purpura noted. Rash is not macular, not papular, not maculopapular, not nodular, not pustular, not vesicular and not urticarial. He is not diaphoretic. There is erythema. No cyanosis. No pallor. Nails show no clubbing.     Psychiatric: He has a normal mood and affect. His behavior is normal. Judgment and thought content normal.     Lab Results  Component Value Date   WBC 5.8 08/21/2012   HGB 15.6 08/21/2012   HCT 45.8 08/21/2012   PLT 251.0 08/21/2012   GLUCOSE 87 08/21/2012   CHOL 238* 08/21/2012   TRIG 348.0* 08/21/2012   HDL 43.80 08/21/2012   LDLDIRECT 128.5 08/21/2012   LDLCALC 99 02/13/2010   ALT 67* 08/21/2012   AST 46* 08/21/2012   NA 140 08/21/2012   K 4.6 08/21/2012   CL 100 08/21/2012   CREATININE 0.9 08/21/2012   BUN 15 08/21/2012   CO2 29 08/21/2012   TSH 2.48 08/21/2012   PSA 0.36 08/21/2012   HGBA1C 4.9 08/21/2012        Assessment & Plan:

## 2013-02-05 NOTE — Assessment & Plan Note (Signed)
His BP is well controlled Today I will recheck his lytes and renal function

## 2013-02-08 ENCOUNTER — Encounter: Payer: Self-pay | Admitting: Internal Medicine

## 2013-02-08 LAB — B. BURGDORFI ANTIBODIES BY WB: B burgdorferi IgM Abs (IB): NEGATIVE

## 2013-02-12 ENCOUNTER — Telehealth: Payer: Self-pay | Admitting: *Deleted

## 2013-02-12 NOTE — Telephone Encounter (Signed)
Dermatology referral

## 2013-02-12 NOTE — Telephone Encounter (Signed)
Patient's spouse called RE: L[facility] Disease test results; informed Negative, states that pt still has [place] print on back w/itching [reports been almost one year]. Pt would like your recommendation as to what the next step would be for this matter/SLS Please advise.

## 2013-02-13 NOTE — Telephone Encounter (Signed)
LMOM with contact name and number for return call RE: referral/SLS

## 2013-02-21 ENCOUNTER — Ambulatory Visit (INDEPENDENT_AMBULATORY_CARE_PROVIDER_SITE_OTHER): Payer: BC Managed Care – PPO | Admitting: Nurse Practitioner

## 2013-02-21 ENCOUNTER — Encounter: Payer: Self-pay | Admitting: Nurse Practitioner

## 2013-02-21 VITALS — BP 152/78 | HR 84

## 2013-02-21 DIAGNOSIS — R0789 Other chest pain: Secondary | ICD-10-CM

## 2013-02-21 DIAGNOSIS — R079 Chest pain, unspecified: Secondary | ICD-10-CM

## 2013-02-21 NOTE — Progress Notes (Signed)
Exercise Treadmill Test  Pre-Exercise Testing Evaluation Rhythm: sinus tachycardia  Rate: 86                 Test  Exercise Tolerance Test Ordering MD: Scarlette Calico, MD  Interpreting MD: Truitt Merle, NP  Unique Test No: 1  Treadmill:  1  Indication for ETT: chest pain - rule out ischemia  Contraindication to ETT: No   Stress Modality: exercise - treadmill  Cardiac Imaging Performed: non   Protocol: standard Bruce - maximal  Max BP:  214/90  Max MPHR (bpm):  164 85% MPR (bpm):  139  MPHR obtained (bpm):  157 % MPHR obtained:  95%  Reached 85% MPHR (min:sec):  1 min Total Exercise Time (min-sec):  1:41  Workload in METS:  4.0 Borg Scale: 15  Reason ETT Terminated:  patient's desire to stop    ST Segment Analysis At Rest: normal ST segments - no evidence of significant ST depression With Exercise: no evidence of significant ST depression  Other Information Arrhythmia:  No Angina during ETT:  present (1) Quality of ETT:  non-diagnostic  ETT Interpretation:  Non-Diagnostic  Comments: Patient presents today from his PCP for atypical chest pain. Has had chest pain and shortness of breath for one month. Rated a "1" on scale of 0 to 10. Not aggravated by anything. Not any worse but not any better. No regular exercise program. Endorses chronic hip and back pain. His risk factors include HTN, HLD, obesity, sedentary lifestyle. No early heart disease in his family. Admits to alcohol. Wife notes excessive alcohol use.   Today, he exercised on the standard Bruce protocol for only 1:41. He has quite poor exercise tolerance. BP response was hypertensive. Clinically he had complaints of chest pain even prior to getting on the treadmill - no worse with walking - but improved in recovery. EKG was negative. No arrhythmia.   Recommendations: This GXT is non diagnostic. He has very poor exercise tolerance. In light of complaints of chest pain and risk factors will proceed on with Lexiscan.   Further disposition to follow.  Needs to address his alcohol use and engage in more healthy behaviors.   Patient is agreeable to this plan and will call if any problems develop in the interim.   Burtis Junes, RN, ANP-C [facility] [address] [place], [place]  [id number]

## 2013-03-01 ENCOUNTER — Ambulatory Visit (HOSPITAL_COMMUNITY): Payer: BC Managed Care – PPO | Attending: Nurse Practitioner | Admitting: Radiology

## 2013-03-01 VITALS — BP 136/85 | HR 64 | Ht 67.0 in | Wt 204.0 lb

## 2013-03-01 DIAGNOSIS — I10 Essential (primary) hypertension: Secondary | ICD-10-CM | POA: Insufficient documentation

## 2013-03-01 DIAGNOSIS — R0609 Other forms of dyspnea: Secondary | ICD-10-CM | POA: Insufficient documentation

## 2013-03-01 DIAGNOSIS — R0602 Shortness of breath: Secondary | ICD-10-CM

## 2013-03-01 DIAGNOSIS — Z87891 Personal history of nicotine dependence: Secondary | ICD-10-CM | POA: Insufficient documentation

## 2013-03-01 DIAGNOSIS — R42 Dizziness and giddiness: Secondary | ICD-10-CM | POA: Insufficient documentation

## 2013-03-01 DIAGNOSIS — R002 Palpitations: Secondary | ICD-10-CM | POA: Insufficient documentation

## 2013-03-01 DIAGNOSIS — R0989 Other specified symptoms and signs involving the circulatory and respiratory systems: Secondary | ICD-10-CM | POA: Insufficient documentation

## 2013-03-01 DIAGNOSIS — R079 Chest pain, unspecified: Secondary | ICD-10-CM | POA: Insufficient documentation

## 2013-03-01 MED ORDER — TECHNETIUM TC 99M SESTAMIBI GENERIC - CARDIOLITE
30.0000 | Freq: Once | INTRAVENOUS | Status: AC | PRN
  Administered 2013-03-01: 30 via INTRAVENOUS

## 2013-03-01 MED ORDER — REGADENOSON 0.4 MG/5ML IV SOLN
0.4000 mg | Freq: Once | INTRAVENOUS | Status: AC
  Administered 2013-03-01: 0.4 mg via INTRAVENOUS

## 2013-03-01 MED ORDER — TECHNETIUM TC 99M SESTAMIBI GENERIC - CARDIOLITE
10.0000 | Freq: Once | INTRAVENOUS | Status: AC | PRN
  Administered 2013-03-01: 10 via INTRAVENOUS

## 2013-03-01 NOTE — Progress Notes (Signed)
[facility] [address] [place], [place] [address] [phone]    Cardiology Nuclear Med Study  Kenneth Singleton is a 57 y.o. male     MRN : [id number]     DOB: December 09, 1955  Procedure Date: 03/01/2013  Nuclear Med Background Indication for Stress Test:  Evaluation for Ischemia History:  '04 TKW:IOXBDZHG, EF=65%>Cath:normal coronaries; 02/21/13 GXT:non-diagnostic, HTN response Cardiac Risk Factors: History of Smoking, Hypertension and Lipids  Symptoms:  Chest Pain/Tightness with and without Exertion (last episode of chest discomfort is now, 2/10), Dizziness, DOE/SOB, Palpitations and Rapid HR    Nuclear Pre-Procedure Caffeine/Decaff Intake:  None > 12 hrs NPO After: 7:00pm   Lungs:  Clear. O2 Sat: 95% on room air. IV 0.9% NS with Angio Cath:  20g  IV Site: R Antecubital x 1, tolerated well IV Started by:  Irven Baltimore, RN  Chest Size (in):  40 Cup Size: n/a  Height: 5' 7"  (1.702 m)  Weight:  204 lb (92.534 kg)  BMI:  Body mass index is 31.94 kg/(m^2). Tech Comments:  Hyzaar taken today on arrival    Nuclear Med Study 1 or 2 day study: 1 day  Stress Test Type:  Lexiscan  Reading MD: Loralie Champagne, MD  Order Authorizing Provider:  Scarlette Calico, MD, and Truitt Merle, NP  Resting Radionuclide: Technetium 17mSestamibi  Resting Radionuclide Dose: 11.0 mCi   Stress Radionuclide:  Technetium 965mestamibi  Stress Radionuclide Dose: 33.0 mCi           Stress Protocol Rest HR: 64 Stress HR: 112  Rest BP: 136/85 Stress BP: 156/85  Exercise Time (min): n/a METS: n/a   Predicted Max HR: 164 bpm % Max HR: 68.29 bpm Rate Pressure Product: 17472   Dose of Adenosine (mg):  n/a Dose of Lexiscan: 0.4 mg  Dose of Atropine (mg): n/a Dose of Dobutamine: n/a mcg/kg/min (at max HR)  Stress Test Technologist: ShLetta MoynahanCMA-N  Nuclear Technologist:  StCharlton AmorCNMT     Rest Procedure:  Myocardial perfusion imaging was performed at rest 45 minutes  following the intravenous administration of Technetium 993mstamibi.  Rest ECG: NSR - Normal EKG  Stress Procedure:  The patient received IV Lexiscan 0.4 mg over 15-seconds.  Technetium 64m53mtamibi injected at 30-seconds.  Quantitative spect images were obtained after a 45 minute delay.  Stress ECG: No significant change from baseline ECG  QPS Raw Data Images:  Normal; no motion artifact; normal heart/lung ratio. Stress Images:  Normal homogeneous uptake in all areas of the myocardium. Rest Images:  Normal homogeneous uptake in all areas of the myocardium. Subtraction (SDS):  There is no evidence of scar or ischemia. Transient Ischemic Dilatation (Normal <1.22):  1.16 Lung/Heart Ratio (Normal <0.45):  0.33  Quantitative Gated Spect Images QGS EDV:  104 ml QGS ESV:  43 ml  Impression Exercise Capacity:  Lexiscan with no exercise. BP Response:  Normal blood pressure response. Clinical Symptoms:  Short of breath.  ECG Impression:  No significant ST segment change suggestive of ischemia. Comparison with Prior Nuclear Study: No images to compare  Overall Impression:  Normal stress nuclear study.  LV Ejection Fraction: 59%.  LV Wall Motion:  NL LV Function; NL Wall Motion  DaltLoralie Champagne/2014

## 2013-03-02 ENCOUNTER — Telehealth: Payer: Self-pay | Admitting: Nurse Practitioner

## 2013-03-02 NOTE — Telephone Encounter (Signed)
Follow Up   Pt calling in wanting to follow up on myocardial perfusion test results.

## 2013-03-05 NOTE — Telephone Encounter (Signed)
This is a Dr Ronnald Ramp pt and order/ not established here with cardiology. Normal results given and asked them to follow up with Dr Ronnald Ramp for further instruction. Verbalized understanding.

## 2013-03-05 NOTE — Telephone Encounter (Signed)
Follow up    Pt calling wanting to know results of myocardial perfusion test.

## 2013-03-07 ENCOUNTER — Telehealth: Payer: Self-pay

## 2013-03-07 NOTE — Telephone Encounter (Signed)
He needs to be seen for a f/up

## 2013-03-07 NOTE — Telephone Encounter (Signed)
Pt's spouse called requesting MD advisement on the next step in pt's treatment regarding cardiac sxs. Per spouse, pt is still having same sxs - Chest Pain, please advise.

## 2013-03-08 NOTE — Telephone Encounter (Signed)
Spouse advised and will call back to schedule f/up appt

## 2013-03-12 ENCOUNTER — Ambulatory Visit (INDEPENDENT_AMBULATORY_CARE_PROVIDER_SITE_OTHER): Payer: BC Managed Care – PPO | Admitting: Internal Medicine

## 2013-03-12 ENCOUNTER — Encounter: Payer: Self-pay | Admitting: Internal Medicine

## 2013-03-12 ENCOUNTER — Ambulatory Visit (INDEPENDENT_AMBULATORY_CARE_PROVIDER_SITE_OTHER)
Admission: RE | Admit: 2013-03-12 | Discharge: 2013-03-12 | Disposition: A | Discharge status: Home / Self Care | Payer: BC Managed Care – PPO | Source: Ambulatory Visit | Attending: Internal Medicine | Admitting: Internal Medicine

## 2013-03-12 VITALS — BP 138/78 | HR 82 | Temp 98.2°F | Resp 16 | Wt 214.0 lb

## 2013-03-12 DIAGNOSIS — IMO0001 Reserved for inherently not codable concepts without codable children: Secondary | ICD-10-CM

## 2013-03-12 DIAGNOSIS — I10 Essential (primary) hypertension: Secondary | ICD-10-CM

## 2013-03-12 DIAGNOSIS — R05 Cough: Secondary | ICD-10-CM

## 2013-03-12 DIAGNOSIS — R0789 Other chest pain: Secondary | ICD-10-CM

## 2013-03-12 DIAGNOSIS — J449 Chronic obstructive pulmonary disease, unspecified: Secondary | ICD-10-CM

## 2013-03-12 MED ORDER — ACLIDINIUM BROMIDE 400 MCG/ACT IN AEPB: 1.0000 | INHALATION_SPRAY | Freq: Two times a day (BID) | RESPIRATORY_TRACT | Status: DC

## 2013-03-12 NOTE — Progress Notes (Signed)
Subjective:    Patient ID: Kenneth Singleton, male    DOB: July 31, 1956, 57 y.o.   MRN: [id number]  Cough This is a chronic problem. The current episode started more than 1 month ago. The problem has been unchanged. The problem occurs every few hours. The cough is productive of sputum. Associated symptoms include chest pain (he has persistent tightness across the front of his chest), shortness of breath and wheezing. Pertinent negatives include no chills, ear congestion, ear pain, fever, headaches, heartburn, hemoptysis, myalgias, nasal congestion, postnasal drip, rash, rhinorrhea, sore throat, sweats or weight loss. Risk factors for lung disease include smoking/tobacco exposure. His past medical history is significant for COPD.      Review of Systems  Constitutional: Negative.  Negative for fever, chills, weight loss, diaphoresis, activity change, appetite change, fatigue and unexpected weight change.  HENT: Negative.  Negative for ear pain, nosebleeds, sore throat, rhinorrhea and postnasal drip.   Eyes: Negative.   Respiratory: Positive for cough, shortness of breath and wheezing. Negative for apnea, hemoptysis, choking, chest tightness and stridor.   Cardiovascular: Positive for chest pain (he has persistent tightness across the front of his chest). Negative for palpitations and leg swelling.  Gastrointestinal: Negative.  Negative for heartburn, nausea, vomiting, abdominal pain, diarrhea, constipation and blood in stool.  Endocrine: Negative.   Genitourinary: Negative.   Musculoskeletal: Negative.  Negative for myalgias, back pain, joint swelling, arthralgias and gait problem.  Skin: Negative.  Negative for color change, pallor, rash and wound.  Allergic/Immunologic: Negative.   Neurological: Negative.  Negative for dizziness, tremors, seizures, syncope, facial asymmetry, speech difficulty, weakness, light-headedness, numbness and headaches.  Hematological: Negative.  Negative for adenopathy. Does  not bruise/bleed easily.  Psychiatric/Behavioral: Negative.        Objective:   Physical Exam  Vitals reviewed. Constitutional: He is oriented to person, place, and time. He appears well-developed and well-nourished.  Non-toxic appearance. He does not have a sickly appearance. He does not appear ill. No distress.  HENT:  Head: Normocephalic and atraumatic.  Mouth/Throat: Oropharynx is clear and moist. No oropharyngeal exudate.  Eyes: Conjunctivae are normal. Right eye exhibits no discharge. Left eye exhibits no discharge. No scleral icterus.  Neck: Normal range of motion. Neck supple. No JVD present. No tracheal deviation present. No thyromegaly present.  Cardiovascular: Normal rate, regular rhythm, normal heart sounds and intact distal pulses.  Exam reveals no gallop and no friction rub.   No murmur heard. Pulmonary/Chest: Effort normal and breath sounds normal. No accessory muscle usage or stridor. Not tachypneic. No respiratory distress. He has no decreased breath sounds. He has no wheezes. He has no rhonchi. He has no rales. He exhibits no tenderness.  Abdominal: Soft. Bowel sounds are normal. He exhibits no distension and no mass. There is no tenderness. There is no rebound and no guarding.  Musculoskeletal: Normal range of motion. He exhibits no edema and no tenderness.  Lymphadenopathy:    He has no cervical adenopathy.  Neurological: He is oriented to person, place, and time.  Skin: Skin is warm and dry. No rash noted. He is not diaphoretic. No erythema. No pallor.  Psychiatric: He has a normal mood and affect. His behavior is normal. Judgment and thought content normal.     Lab Results  Component Value Date   WBC 5.6 02/05/2013   HGB 15.1 02/05/2013   HCT 43.5 02/05/2013   PLT 262.0 02/05/2013   GLUCOSE 112* 02/05/2013   CHOL 198 02/05/2013   TRIG 230.0*  02/05/2013   HDL 48.90 02/05/2013   LDLDIRECT 125.4 02/05/2013   LDLCALC 99 02/13/2010   ALT 32 02/05/2013   AST 23 02/05/2013    NA 138 02/05/2013   K 4.2 02/05/2013   CL 103 02/05/2013   CREATININE 1.0 02/05/2013   BUN 19 02/05/2013   CO2 27 02/05/2013   TSH 2.48 08/21/2012   PSA 0.36 08/21/2012   HGBA1C 4.9 08/21/2012       Assessment & Plan:

## 2013-03-12 NOTE — Assessment & Plan Note (Signed)
I will check a CXR to see if he has a widened mediastinum, lung mass, edema, pna, etc

## 2013-03-12 NOTE — Assessment & Plan Note (Signed)
He will start using tudorza - I gave him a sample of tudorza and showed him how to use it, he demonstrated proficiency with its use

## 2013-03-12 NOTE — Patient Instructions (Signed)
Chronic Obstructive Pulmonary Disease Chronic obstructive pulmonary disease (COPD) is a condition in which airflow from the lungs is restricted. The lungs can never return to normal, but there are measures you can take which will improve them and make you feel better. CAUSES   Smoking.  Exposure to secondhand smoke.  Breathing in irritants (pollution, cigarette smoke, strong smells, aerosol sprays, paint fumes).  History of lung infections. TREATMENT  Treatment focuses on making you comfortable (supportive care). Your caregiver may prescribe medications (inhaled or pills) to help improve your breathing. HOME CARE INSTRUCTIONS   If you smoke, stop smoking.  Avoid exposure to smoke, chemicals, and fumes that aggravate your breathing.  Take antibiotic medicines as directed by your caregiver.  Avoid medicines that dry up your system and slow down the elimination of secretions (antihistamines and cough syrups). This decreases respiratory capacity and may lead to infections.  Drink enough water and fluids to keep your urine clear or pale yellow. This loosens secretions.  Use humidifiers at home and at your bedside if they do not make breathing difficult.  Receive all protective vaccines your caregiver suggests, especially pneumococcal and influenza.  Use home oxygen as suggested.  Stay active. Exercise and physical activity will help maintain your ability to do things you want to do.  Eat a healthy diet. SEEK MEDICAL CARE IF:   You develop pus-like mucus (sputum).  Breathing is more labored or exercise becomes difficult to do.  You are running out of the medicine you take for your breathing. SEEK IMMEDIATE MEDICAL CARE IF:   You have a rapid heart rate.  You have agitation, confusion, tremors, or are in a stupor (family members may need to observe this).  It becomes difficult to breathe.  You develop chest pain.  You have a fever. MAKE SURE YOU:   Understand these  instructions.  Will watch your condition.  Will get help right away if you are not doing well or get worse. Document Released: 08/25/2005 Document Revised: 02/07/2012 Document Reviewed: 01/15/2011 [facility] Patient Information 2013 [place].

## 2013-03-12 NOTE — Assessment & Plan Note (Signed)
His BP is well controlled 

## 2013-03-12 NOTE — Assessment & Plan Note (Signed)
I will check his CXR today and will start treating the COPD

## 2013-09-11 ENCOUNTER — Other Ambulatory Visit: Payer: Self-pay | Admitting: Internal Medicine

## 2013-09-19 ENCOUNTER — Telehealth: Payer: Self-pay | Admitting: *Deleted

## 2013-09-19 DIAGNOSIS — M87059 Idiopathic aseptic necrosis of unspecified femur: Secondary | ICD-10-CM

## 2013-09-19 MED ORDER — CELECOXIB 200 MG PO CAPS: ORAL_CAPSULE | ORAL | Status: DC

## 2013-09-19 NOTE — Telephone Encounter (Signed)
A user error has taken place.

## 2013-09-26 ENCOUNTER — Other Ambulatory Visit (INDEPENDENT_AMBULATORY_CARE_PROVIDER_SITE_OTHER): Payer: BC Managed Care – PPO

## 2013-09-26 ENCOUNTER — Encounter: Payer: Self-pay | Admitting: Internal Medicine

## 2013-09-26 ENCOUNTER — Ambulatory Visit (INDEPENDENT_AMBULATORY_CARE_PROVIDER_SITE_OTHER): Payer: BC Managed Care – PPO | Admitting: Internal Medicine

## 2013-09-26 VITALS — BP 112/70 | HR 88 | Temp 98.3°F | Resp 16 | Ht 67.0 in | Wt 211.5 lb

## 2013-09-26 DIAGNOSIS — Z0000 Encounter for general adult medical examination without abnormal findings: Secondary | ICD-10-CM

## 2013-09-26 DIAGNOSIS — F1020 Alcohol dependence, uncomplicated: Secondary | ICD-10-CM

## 2013-09-26 DIAGNOSIS — I10 Essential (primary) hypertension: Secondary | ICD-10-CM

## 2013-09-26 DIAGNOSIS — R7309 Other abnormal glucose: Secondary | ICD-10-CM

## 2013-09-26 DIAGNOSIS — M549 Dorsalgia, unspecified: Secondary | ICD-10-CM

## 2013-09-26 DIAGNOSIS — G8929 Other chronic pain: Secondary | ICD-10-CM

## 2013-09-26 DIAGNOSIS — E785 Hyperlipidemia, unspecified: Secondary | ICD-10-CM

## 2013-09-26 LAB — COMPREHENSIVE METABOLIC PANEL
ALT: 56 U/L — ABNORMAL HIGH (ref 0–53)
AST: 53 U/L — ABNORMAL HIGH (ref 0–37)
Alkaline Phosphatase: 66 U/L (ref 39–117)
BUN: 16 mg/dL (ref 6–23)
CO2: 28 mEq/L (ref 19–32)
Calcium: 9.7 mg/dL (ref 8.4–10.5)
Chloride: 100 mEq/L (ref 96–112)
Creatinine, Ser: 0.9 mg/dL (ref 0.4–1.5)
Potassium: 4.2 mEq/L (ref 3.5–5.1)

## 2013-09-26 LAB — CBC WITH DIFFERENTIAL/PLATELET
Basophils Absolute: 0.1 10*3/uL (ref 0.0–0.1)
Basophils Relative: 0.9 % (ref 0.0–3.0)
Eosinophils Absolute: 0.1 10*3/uL (ref 0.0–0.7)
Hemoglobin: 15.4 g/dL (ref 13.0–17.0)
Lymphocytes Relative: 18.3 % (ref 12.0–46.0)
Lymphs Abs: 1 10*3/uL (ref 0.7–4.0)
MCHC: 35.3 g/dL (ref 30.0–36.0)
MCV: 94.4 fl (ref 78.0–100.0)
Monocytes Absolute: 0.5 10*3/uL (ref 0.1–1.0)
Neutro Abs: 4 10*3/uL (ref 1.4–7.7)
RBC: 4.63 Mil/uL (ref 4.22–5.81)
RDW: 12.2 % (ref 11.5–14.6)
WBC: 5.7 10*3/uL (ref 4.5–10.5)

## 2013-09-26 LAB — URINALYSIS, ROUTINE W REFLEX MICROSCOPIC
Bilirubin Urine: NEGATIVE
Hgb urine dipstick: NEGATIVE
Ketones, ur: NEGATIVE
RBC / HPF: NONE SEEN (ref 0–?)
Specific Gravity, Urine: 1.025 (ref 1.000–1.030)
Total Protein, Urine: NEGATIVE
Urine Glucose: NEGATIVE

## 2013-09-26 LAB — LDL CHOLESTEROL, DIRECT: Direct LDL: 135.2 mg/dL

## 2013-09-26 LAB — LIPID PANEL
HDL: 59.3 mg/dL (ref >39.00)
Total CHOL/HDL Ratio: 4
Triglycerides: 517 mg/dL — ABNORMAL HIGH (ref 0.0–149.0)
VLDL: 103.4 mg/dL — ABNORMAL HIGH (ref 0.0–40.0)

## 2013-09-26 LAB — PSA: PSA: 0.33 ng/mL (ref 0.10–4.00)

## 2013-09-26 LAB — HEMOGLOBIN A1C: Hgb A1c MFr Bld: 5.2 % (ref 4.6–6.5)

## 2013-09-26 MED ORDER — PRAVASTATIN SODIUM 80 MG PO TABS: 80.0000 mg | ORAL_TABLET | Freq: Every evening | ORAL | Status: DC

## 2013-09-26 MED ORDER — LOSARTAN POTASSIUM-HCTZ 100-12.5 MG PO TABS: 1.0000 | ORAL_TABLET | Freq: Every day | ORAL | Status: DC

## 2013-09-26 MED ORDER — TRAMADOL HCL 50 MG PO TABS: 50.0000 mg | ORAL_TABLET | Freq: Three times a day (TID) | ORAL | Status: DC | PRN

## 2013-09-26 NOTE — Progress Notes (Signed)
Subjective:    Patient ID: Kenneth Singleton, male    DOB: September 28, 1956, 57 y.o.   MRN: [id number]  Back Pain This is a chronic problem. The current episode started more than 1 year ago (for 7-8 years). The problem is unchanged. The pain is present in the lumbar spine. The quality of the pain is described as aching and stabbing. The pain radiates to the left thigh. The pain is at a severity of 6/10. The pain is moderate. The pain is worse during the day. The symptoms are aggravated by bending, position and standing. Pertinent negatives include no abdominal pain, bladder incontinence, bowel incontinence, chest pain, dysuria, fever, headaches, leg pain, numbness, paresis, paresthesias, pelvic pain, perianal numbness, tingling, weakness or weight loss. Risk factors include lack of exercise and obesity. He has tried NSAIDs (He ahs been seeing Dr. Nelva Bush, he tells me that ESI has not helped) for the symptoms. The treatment provided mild relief.      Review of Systems  Constitutional: Negative.  Negative for fever, chills, weight loss, diaphoresis, activity change, appetite change, fatigue and unexpected weight change.  HENT: Negative.   Eyes: Negative.   Respiratory: Negative.  Negative for cough, choking, chest tightness, shortness of breath, wheezing and stridor.   Cardiovascular: Negative.  Negative for chest pain, palpitations and leg swelling.  Gastrointestinal: Negative.  Negative for nausea, vomiting, abdominal pain, diarrhea, constipation, blood in stool and bowel incontinence.  Endocrine: Negative.   Genitourinary: Negative.  Negative for bladder incontinence, dysuria and pelvic pain.  Musculoskeletal: Positive for back pain. Negative for arthralgias, gait problem, joint swelling, myalgias, neck pain and neck stiffness.  Skin: Negative.   Allergic/Immunologic: Negative.   Neurological: Negative.  Negative for dizziness, tingling, tremors, weakness, light-headedness, numbness, headaches and  paresthesias.  Hematological: Negative.  Negative for adenopathy. Does not bruise/bleed easily.  Psychiatric/Behavioral: Negative.        Objective:   Physical Exam  Vitals reviewed. Constitutional: He is oriented to person, place, and time. He appears well-developed and well-nourished. No distress.  HENT:  Head: Normocephalic and atraumatic.  Mouth/Throat: Oropharynx is clear and moist. No oropharyngeal exudate.  Eyes: Conjunctivae are normal. Right eye exhibits no discharge. Left eye exhibits no discharge. No scleral icterus.  Neck: Normal range of motion. Neck supple. No JVD present. No tracheal deviation present. No thyromegaly present.  Cardiovascular: Normal rate, regular rhythm, normal heart sounds and intact distal pulses.  Exam reveals no gallop and no friction rub.   No murmur heard. Pulmonary/Chest: Effort normal and breath sounds normal. No stridor. No respiratory distress. He has no wheezes. He has no rales. He exhibits no tenderness.  Abdominal: Soft. Bowel sounds are normal. He exhibits no distension and no mass. There is no tenderness. There is no rebound and no guarding. Hernia confirmed negative in the right inguinal area and confirmed negative in the left inguinal area.  Genitourinary: Rectum normal, testes normal and penis normal. Rectal exam shows no external hemorrhoid, no internal hemorrhoid, no fissure, no mass, no tenderness and anal tone normal. Guaiac negative stool. Prostate is not enlarged and not tender. Right testis shows no mass, no swelling and no tenderness. Right testis is descended. Left testis shows no mass, no swelling and no tenderness. Left testis is descended. Circumcised. No penile erythema or penile tenderness. No discharge found.  Musculoskeletal: Normal range of motion. He exhibits no edema and no tenderness.  Lymphadenopathy:    He has no cervical adenopathy.       Right:  No inguinal adenopathy present.       Left: No inguinal adenopathy present.   Neurological: He is alert and oriented to person, place, and time. He has normal strength. He displays no atrophy, no tremor and normal reflexes. No cranial nerve deficit or sensory deficit. He exhibits normal muscle tone. He displays a negative Romberg sign. He displays no seizure activity. Coordination and gait normal. He displays no Babinski's sign on the right side. He displays no Babinski's sign on the left side.  Reflex Scores:      Tricep reflexes are 1+ on the right side and 1+ on the left side.      Bicep reflexes are 1+ on the right side and 1+ on the left side.      Brachioradialis reflexes are 1+ on the right side and 1+ on the left side.      Patellar reflexes are 1+ on the right side and 1+ on the left side.      Achilles reflexes are 1+ on the right side and 1+ on the left side. Neg SLR in BLE  Skin: Skin is warm and dry. No rash noted. He is not diaphoretic. No erythema. No pallor.  Psychiatric: He has a normal mood and affect. His behavior is normal. Judgment and thought content normal.     Lab Results  Component Value Date   WBC 5.6 02/05/2013   HGB 15.1 02/05/2013   HCT 43.5 02/05/2013   PLT 262.0 02/05/2013   GLUCOSE 112* 02/05/2013   CHOL 198 02/05/2013   TRIG 230.0* 02/05/2013   HDL 48.90 02/05/2013   LDLDIRECT 125.4 02/05/2013   LDLCALC 99 02/13/2010   ALT 32 02/05/2013   AST 23 02/05/2013   NA 138 02/05/2013   K 4.2 02/05/2013   CL 103 02/05/2013   CREATININE 1.0 02/05/2013   BUN 19 02/05/2013   CO2 27 02/05/2013   TSH 2.48 08/21/2012   PSA 0.36 08/21/2012   HGBA1C 4.9 08/21/2012       Assessment & Plan:

## 2013-09-26 NOTE — Patient Instructions (Signed)
Health Maintenance, Males A healthy lifestyle and preventative care can promote health and wellness.  Maintain regular health, dental, and eye exams.  Eat a healthy diet. Foods like vegetables, fruits, whole grains, low-fat dairy products, and lean protein foods contain the nutrients you need without too many calories. Decrease your intake of foods high in solid fats, added sugars, and salt. Get information about a proper diet from your caregiver, if necessary.  Regular physical exercise is one of the most important things you can do for your health. Most adults should get at least 150 minutes of moderate-intensity exercise (any activity that increases your heart rate and causes you to sweat) each week. In addition, most adults need muscle-strengthening exercises on 2 or more days a week.   Maintain a healthy weight. The body mass index (BMI) is a screening tool to identify possible weight problems. It provides an estimate of body fat based on height and weight. Your caregiver can help determine your BMI, and can help you achieve or maintain a healthy weight. For adults 20 years and older:  A BMI below 18.5 is considered underweight.  A BMI of 18.5 to 24.9 is normal.  A BMI of 25 to 29.9 is considered overweight.  A BMI of 30 and above is considered obese.  Maintain normal blood lipids and cholesterol by exercising and minimizing your intake of saturated fat. Eat a balanced diet with plenty of fruits and vegetables. Blood tests for lipids and cholesterol should begin at age 11 and be repeated every 5 years. If your lipid or cholesterol levels are high, you are over 50, or you are a high risk for heart disease, you may need your cholesterol levels checked more frequently.Ongoing high lipid and cholesterol levels should be treated with medicines, if diet and exercise are not effective.  If you smoke, find out from your caregiver how to quit. If you do not use tobacco, do not start.  If you  choose to drink alcohol, do not exceed 2 drinks per day. One drink is considered to be 12 ounces (355 mL) of beer, 5 ounces (148 mL) of wine, or 1.5 ounces (44 mL) of liquor.  Avoid use of street drugs. Do not share needles with anyone. Ask for help if you need support or instructions about stopping the use of drugs.  High blood pressure causes heart disease and increases the risk of stroke. Blood pressure should be checked at least every 1 to 2 years. Ongoing high blood pressure should be treated with medicines if weight loss and exercise are not effective.  If you are 15 to 56 years old, ask your caregiver if you should take aspirin to prevent heart disease.  Diabetes screening involves taking a blood sample to check your fasting blood sugar level. This should be done once every 3 years, after age 74, if you are within normal weight and without risk factors for diabetes. Testing should be considered at a younger age or be carried out more frequently if you are overweight and have at least 1 risk factor for diabetes.  Colorectal cancer can be detected and often prevented. Most routine colorectal cancer screening begins at the age of 37 and continues through age 71. However, your caregiver may recommend screening at an earlier age if you have risk factors for colon cancer. On a yearly basis, your caregiver may provide home test kits to check for hidden blood in the stool. Use of a small camera at the end of a tube,  to directly examine the colon (sigmoidoscopy or colonoscopy), can detect the earliest forms of colorectal cancer. Talk to your caregiver about this at age 79, when routine screening begins. Direct examination of the colon should be repeated every 5 to 10 years through age 39, unless early forms of pre-cancerous polyps or small growths are found.  Hepatitis C blood testing is recommended for all people born from [birth year] through 1964 and any individual with known risks for hepatitis C.  Healthy  men should no longer receive prostate-specific antigen (PSA) blood tests as part of routine cancer screening. Consult with your caregiver about prostate cancer screening.  Testicular cancer screening is not recommended for adolescents or adult males who have no symptoms. Screening includes self-exam, caregiver exam, and other screening tests. Consult with your caregiver about any symptoms you have or any concerns you have about testicular cancer.  Practice safe sex. Use condoms and avoid high-risk sexual practices to reduce the spread of sexually transmitted infections (STIs).  Use sunscreen with a sun protection factor (SPF) of 30 or greater. Apply sunscreen liberally and repeatedly throughout the day. You should seek shade when your shadow is shorter than you. Protect yourself by wearing long sleeves, pants, a wide-brimmed hat, and sunglasses year round, whenever you are outdoors.  Notify your caregiver of new moles or changes in moles, especially if there is a change in shape or color. Also notify your caregiver if a mole is larger than the size of a pencil eraser.  A one-time screening for abdominal aortic aneurysm (AAA) and surgical repair of large AAAs by sound wave imaging (ultrasonography) is recommended for ages 53 to 34 years who are current or former smokers.  Stay current with your immunizations. Document Released: 05/13/2008 Document Revised: 02/07/2012 Document Reviewed: 04/12/2011 [facility] Patient Information 2014 [place], [place]. Back Pain, Adult Low back pain is very common. About 1 in 5 people have back pain.The cause of low back pain is rarely dangerous. The pain often gets better over time.About half of people with a sudden onset of back pain feel better in just 2 weeks. About 8 in 10 people feel better by 6 weeks.  CAUSES Some common causes of back pain include:  Strain of the muscles or ligaments supporting the spine.  Wear and tear (degeneration) of the spinal  discs.  Arthritis.  Direct injury to the back. DIAGNOSIS Most of the time, the direct cause of low back pain is not known.However, back pain can be treated effectively even when the exact cause of the pain is unknown.Answering your caregiver's questions about your overall health and symptoms is one of the most accurate ways to make sure the cause of your pain is not dangerous. If your caregiver needs more information, he or she may order lab work or imaging tests (X-rays or MRIs).However, even if imaging tests show changes in your back, this usually does not require surgery. HOME CARE INSTRUCTIONS For many people, back pain returns.Since low back pain is rarely dangerous, it is often a condition that people can learn to [facility] their own.   Remain active. It is stressful on the back to sit or stand in one place. Do not sit, drive, or stand in one place for more than 30 minutes at a time. Take short walks on level surfaces as soon as pain allows.Try to increase the length of time you walk each day.  Do not stay in bed.Resting more than 1 or 2 days can delay your recovery.  Do not  avoid exercise or work.Your body is made to move.It is not dangerous to be active, even though your back may hurt.Your back will likely heal faster if you return to being active before your pain is gone.  Pay attention to your body when you bend and lift. Many people have less discomfortwhen lifting if they bend their knees, keep the load close to their bodies,and avoid twisting. Often, the most comfortable positions are those that put less stress on your recovering back.  Find a comfortable position to sleep. Use a firm mattress and lie on your side with your knees slightly bent. If you lie on your back, put a pillow under your knees.  Only take over-the-counter or prescription medicines as directed by your caregiver. Over-the-counter medicines to reduce pain and inflammation are often the most  helpful.Your caregiver may prescribe muscle relaxant drugs.These medicines help dull your pain so you can more quickly return to your normal activities and healthy exercise.  Put ice on the injured area.  Put ice in a plastic bag.  Place a towel between your skin and the bag.  Leave the ice on for 15-20 minutes, 3-4 times a day for the first 2 to 3 days. After that, ice and heat may be alternated to reduce pain and spasms.  Ask your caregiver about trying back exercises and gentle massage. This may be of some benefit.  Avoid feeling anxious or stressed.Stress increases muscle tension and can worsen back pain.It is important to recognize when you are anxious or stressed and learn ways to manage it.Exercise is a great option. SEEK MEDICAL CARE IF:  You have pain that is not relieved with rest or medicine.  You have pain that does not improve in 1 week.  You have new symptoms.  You are generally not feeling well. SEEK IMMEDIATE MEDICAL CARE IF:   You have pain that radiates from your back into your legs.  You develop new bowel or bladder control problems.  You have unusual weakness or numbness in your arms or legs.  You develop nausea or vomiting.  You develop abdominal pain.  You feel faint. Document Released: 11/15/2005 Document Revised: 05/16/2012 Document Reviewed: 04/05/2011 [facility] Patient Information 2014 [place], [place].

## 2013-09-27 NOTE — Assessment & Plan Note (Signed)
This appears to be related to EtOH and fatty liver disease

## 2013-09-27 NOTE — Assessment & Plan Note (Signed)
Exam done Vaccines were reviewed Labs ordered Pt ed material was given 

## 2013-09-27 NOTE — Assessment & Plan Note (Signed)
He will continue to work on his lifestyle modifications

## 2013-09-27 NOTE — Assessment & Plan Note (Addendum)
He will cont to f/up with Dr. Nelva Bush Will also try tramadol as needed for pain

## 2013-09-27 NOTE — Assessment & Plan Note (Signed)
He continue to drink alcohol

## 2013-09-27 NOTE — Assessment & Plan Note (Signed)
His BP is well controlled His lytes and renal function are normal

## 2013-09-27 NOTE — Assessment & Plan Note (Signed)
I will check his A1C to see if he has developed DM2

## 2013-09-29 IMAGING — CR DG CHEST 2V
2 series · 2 of 2 positions shown · non-contrast
Comparison: 07/20/2007

CLINICAL DATA: Cough and shortness of breath

CHEST - 2 VIEW

[view not recorded (1 of 2)]
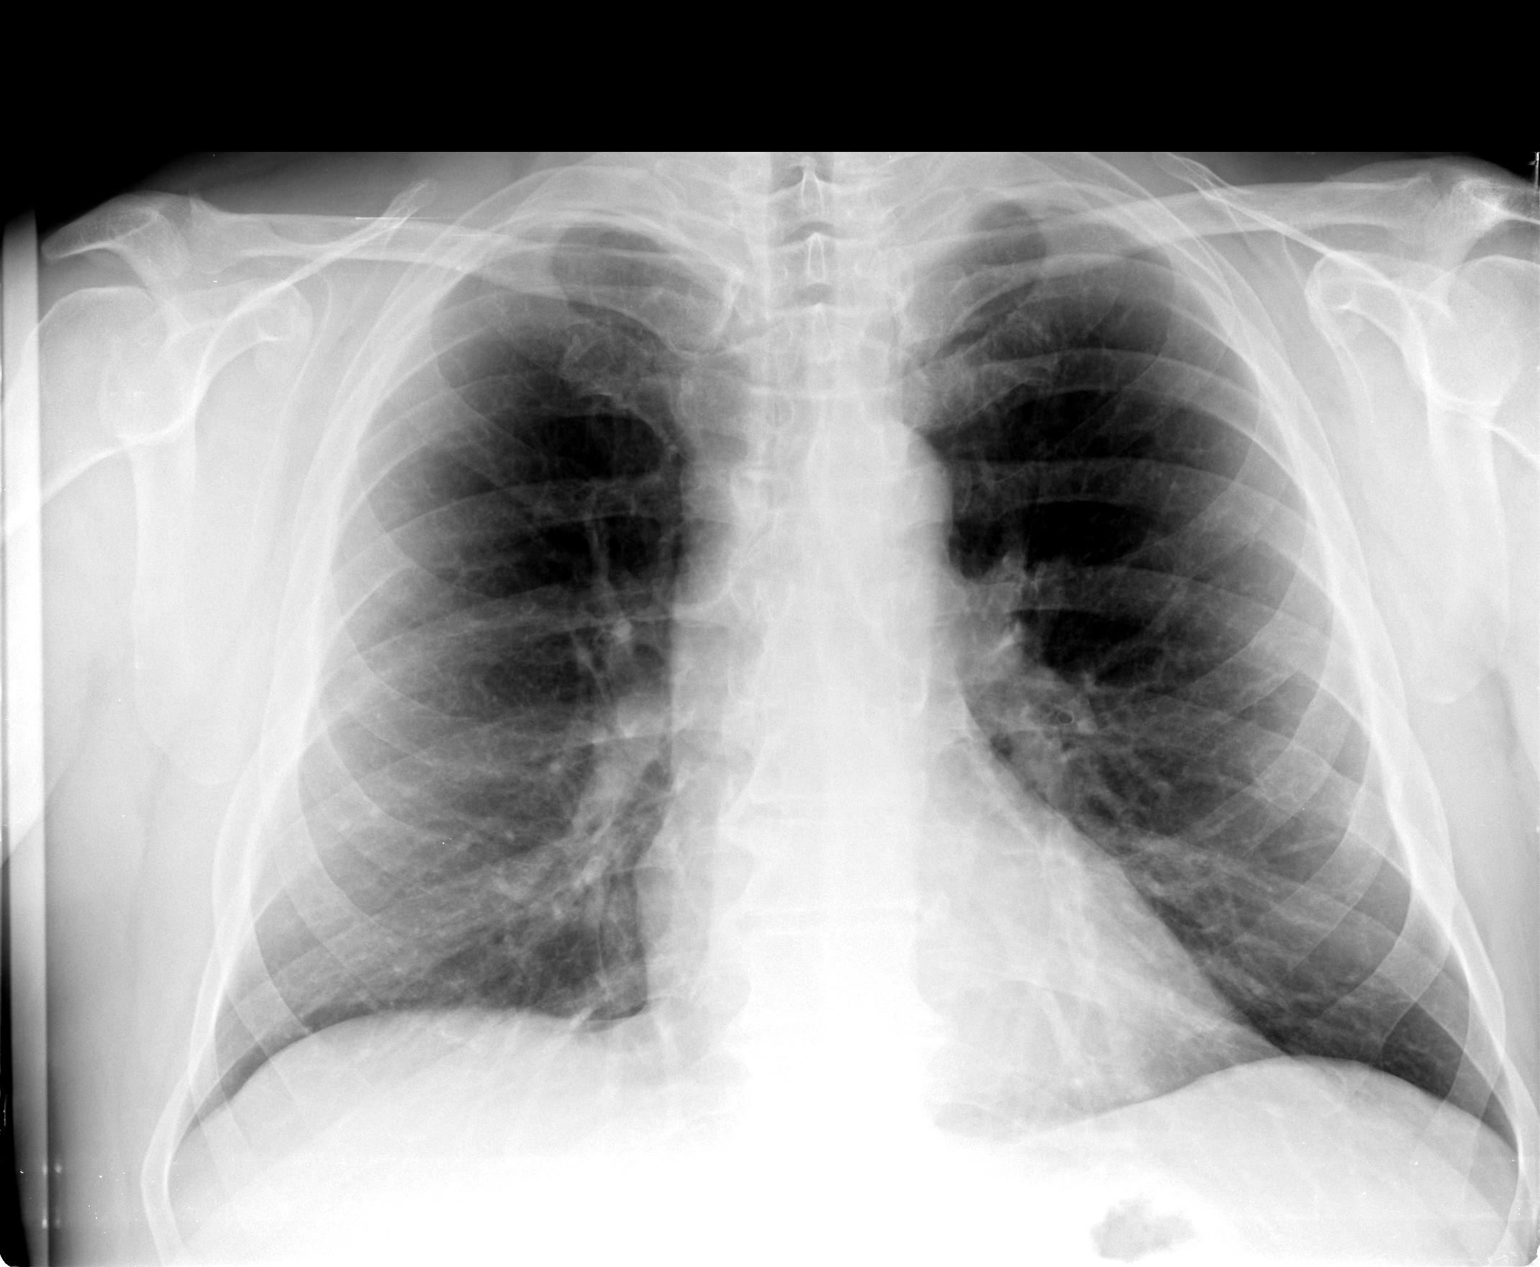

[view not recorded (2 of 2)]
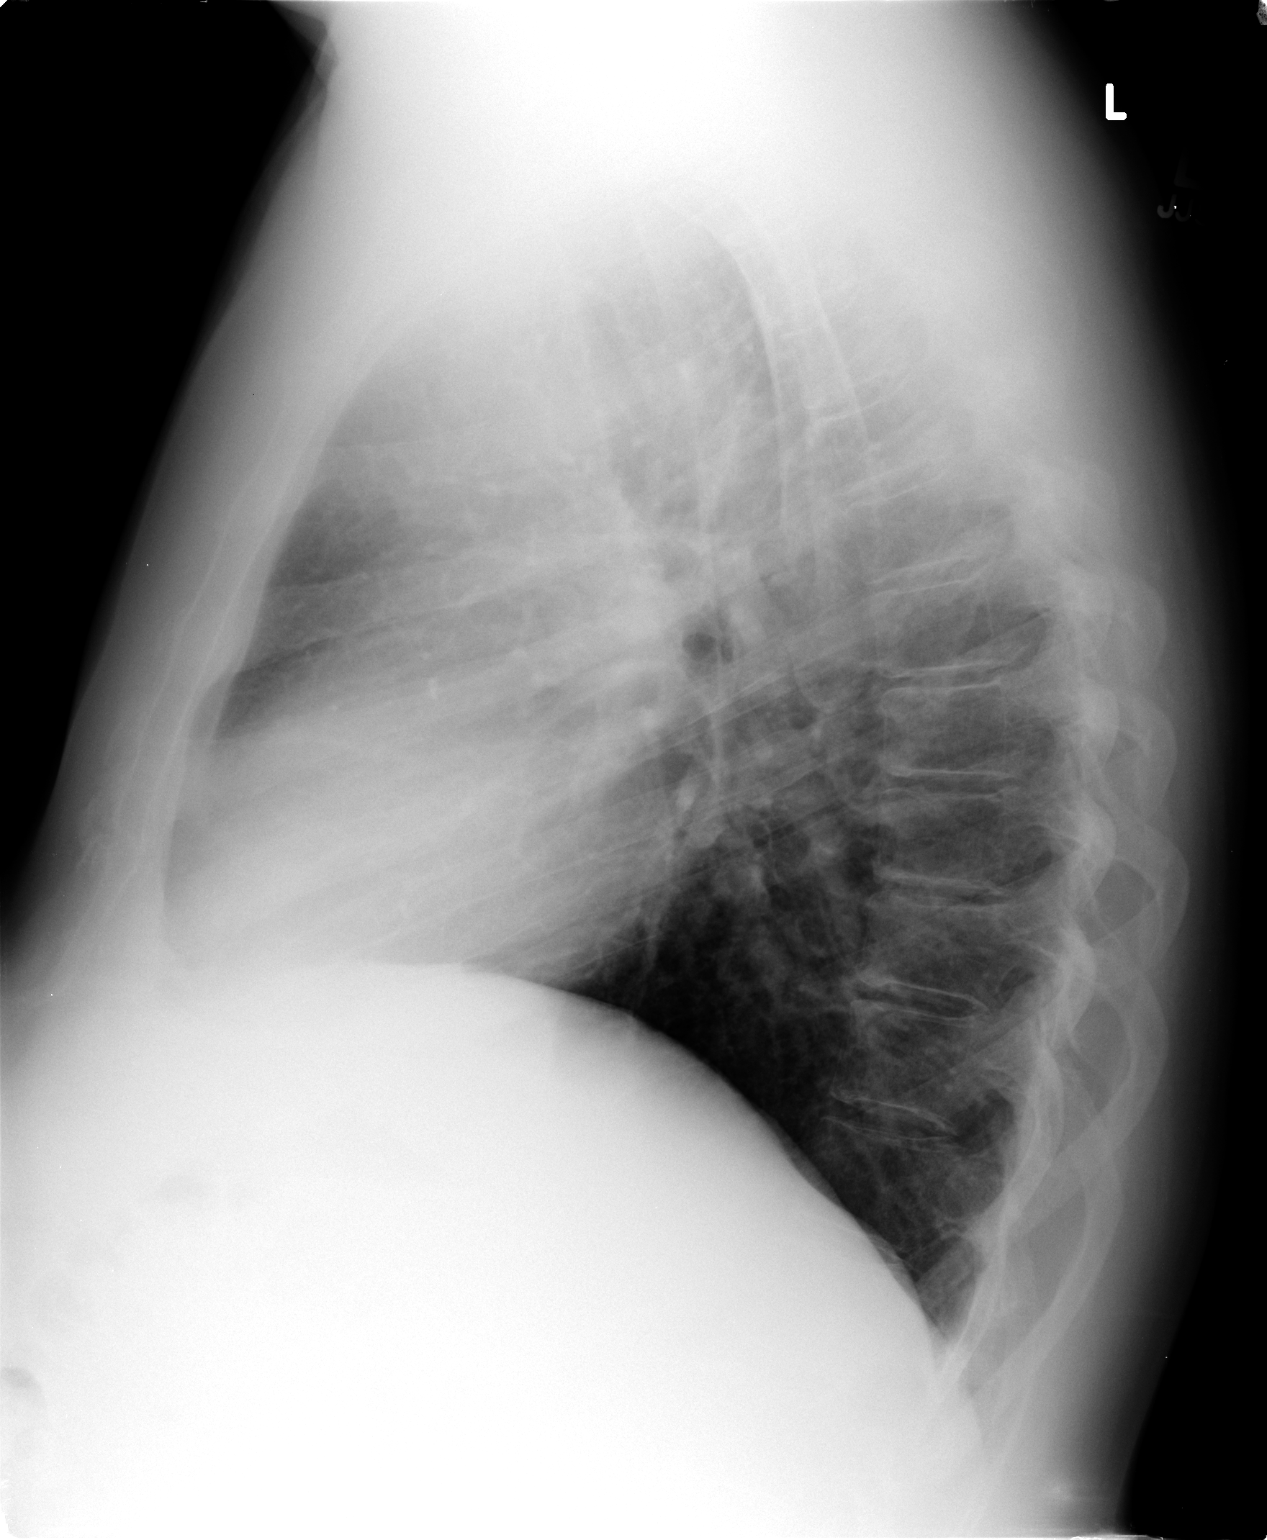

[2 of 2 positions shown; findings below may reference images not displayed]

FINDINGS: The heart and pulmonary vascularity are within normal
limits.  The lungs are clear bilaterally.  No acute bony
abnormality is seen.
IMPRESSION: No acute abnormality noted.

## 2013-12-29 ENCOUNTER — Other Ambulatory Visit: Payer: Self-pay | Admitting: Internal Medicine

## 2014-05-11 ENCOUNTER — Other Ambulatory Visit: Payer: Self-pay | Admitting: Internal Medicine

## 2014-05-22 ENCOUNTER — Other Ambulatory Visit: Payer: Self-pay | Admitting: Internal Medicine

## 2014-06-12 ENCOUNTER — Other Ambulatory Visit: Payer: Self-pay | Admitting: Internal Medicine

## 2014-06-26 ENCOUNTER — Encounter: Payer: Self-pay | Admitting: Internal Medicine

## 2014-06-26 ENCOUNTER — Ambulatory Visit (INDEPENDENT_AMBULATORY_CARE_PROVIDER_SITE_OTHER): Payer: BC Managed Care – PPO | Admitting: Internal Medicine

## 2014-06-26 VITALS — BP 140/80 | HR 77 | Temp 98.6°F | Resp 16 | Ht 67.0 in | Wt 136.2 lb

## 2014-06-26 DIAGNOSIS — M549 Dorsalgia, unspecified: Secondary | ICD-10-CM

## 2014-06-26 DIAGNOSIS — G8929 Other chronic pain: Secondary | ICD-10-CM

## 2014-06-26 DIAGNOSIS — I10 Essential (primary) hypertension: Secondary | ICD-10-CM

## 2014-06-26 DIAGNOSIS — M87059 Idiopathic aseptic necrosis of unspecified femur: Secondary | ICD-10-CM

## 2014-06-26 MED ORDER — TRAMADOL HCL 50 MG PO TABS: 50.0000 mg | ORAL_TABLET | Freq: Three times a day (TID) | ORAL | Status: DC | PRN

## 2014-06-26 NOTE — Assessment & Plan Note (Signed)
His BP is well controlled 

## 2014-06-26 NOTE — Patient Instructions (Signed)
Back Pain, Adult Low back pain is very common. About 1 in 5 people have back pain.The cause of low back pain is rarely dangerous. The pain often gets better over time.About half of people with a sudden onset of back pain feel better in just 2 weeks. About 8 in 10 people feel better by 6 weeks.  CAUSES Some common causes of back pain include:  Strain of the muscles or ligaments supporting the spine.  Wear and tear (degeneration) of the spinal discs.  Arthritis.  Direct injury to the back. DIAGNOSIS Most of the time, the direct cause of low back pain is not known.However, back pain can be treated effectively even when the exact cause of the pain is unknown.Answering your caregiver's questions about your overall health and symptoms is one of the most accurate ways to make sure the cause of your pain is not dangerous. If your caregiver needs more information, he or she may order lab work or imaging tests (X-rays or MRIs).However, even if imaging tests show changes in your back, this usually does not require surgery. HOME CARE INSTRUCTIONS For many people, back pain returns.Since low back pain is rarely dangerous, it is often a condition that people can learn to manageon their own.   Remain active. It is stressful on the back to sit or stand in one place. Do not sit, drive, or stand in one place for more than 30 minutes at a time. Take short walks on level surfaces as soon as pain allows.Try to increase the length of time you walk each day.  Do not stay in bed.Resting more than 1 or 2 days can delay your recovery.  Do not avoid exercise or work.Your body is made to move.It is not dangerous to be active, even though your back may hurt.Your back will likely heal faster if you return to being active before your pain is gone.  Pay attention to your body when you bend and lift. Many people have less discomfortwhen lifting if they bend their knees, keep the load close to their bodies,and  avoid twisting. Often, the most comfortable positions are those that put less stress on your recovering back.  Find a comfortable position to sleep. Use a firm mattress and lie on your side with your knees slightly bent. If you lie on your back, put a pillow under your knees.  Only take over-the-counter or prescription medicines as directed by your caregiver. Over-the-counter medicines to reduce pain and inflammation are often the most helpful.Your caregiver may prescribe muscle relaxant drugs.These medicines help dull your pain so you can more quickly return to your normal activities and healthy exercise.  Put ice on the injured area.  Put ice in a plastic bag.  Place a towel between your skin and the bag.  Leave the ice on for 15-20 minutes, 03-04 times a day for the first 2 to 3 days. After that, ice and heat may be alternated to reduce pain and spasms.  Ask your caregiver about trying back exercises and gentle massage. This may be of some benefit.  Avoid feeling anxious or stressed.Stress increases muscle tension and can worsen back pain.It is important to recognize when you are anxious or stressed and learn ways to manage it.Exercise is a great option. SEEK MEDICAL CARE IF:  You have pain that is not relieved with rest or medicine.  You have pain that does not improve in 1 week.  You have new symptoms.  You are generally not feeling well. SEEK   IMMEDIATE MEDICAL CARE IF:   You have pain that radiates from your back into your legs.  You develop new bowel or bladder control problems.  You have unusual weakness or numbness in your arms or legs.  You develop nausea or vomiting.  You develop abdominal pain.  You feel faint. Document Released: 11/15/2005 Document Revised: 05/16/2012 Document Reviewed: 03/19/2014 [employer] Patient Information 2015 [employer]. This information is not intended to replace advice given to you by your health care provider. Make sure you  discuss any questions you have with your health care provider.  

## 2014-06-26 NOTE — Assessment & Plan Note (Signed)
He will continue the current meds for pain and I have asked him to f/up with Dr. Nelva Bush

## 2014-06-26 NOTE — Progress Notes (Signed)
Pre visit review using our clinic review tool, if applicable. No additional management support is needed unless otherwise documented below in the visit note. 

## 2014-06-26 NOTE — Assessment & Plan Note (Signed)
Will continue the current meds for pain

## 2014-06-26 NOTE — Progress Notes (Signed)
Subjective:    Patient ID: Kenneth Singleton, Kenneth    DOB: 1955/12/29, 58 y.o.   MRN: [id number]  Back Pain This is a chronic problem. The current episode started more than 1 year ago. The problem occurs intermittently. The problem is unchanged. The pain is present in the lumbar spine. The quality of the pain is described as stabbing and aching. The pain radiates to the left thigh. The pain is at a severity of 3/10. The pain is moderate. The pain is worse during the day. The symptoms are aggravated by bending, position and twisting. Associated symptoms include leg pain (left) and paresthesias (in his LLE). Pertinent negatives include no abdominal pain, bladder incontinence, bowel incontinence, chest pain, dysuria, fever, headaches, numbness, paresis, pelvic pain, perianal numbness, tingling, weakness or weight loss. He has tried NSAIDs and analgesics for the symptoms. The treatment provided moderate relief.      Review of Systems  Constitutional: Negative.  Negative for fever, chills, weight loss, diaphoresis, appetite change and fatigue.  HENT: Negative.   Eyes: Negative.   Respiratory: Negative.  Negative for apnea, cough, choking, chest tightness, shortness of breath, wheezing and stridor.   Cardiovascular: Negative.  Negative for chest pain, palpitations and leg swelling.  Gastrointestinal: Negative.  Negative for nausea, vomiting, abdominal pain, diarrhea, constipation, blood in stool and bowel incontinence.  Endocrine: Negative.   Genitourinary: Negative.  Negative for bladder incontinence, dysuria and pelvic pain.  Musculoskeletal: Positive for arthralgias and back pain. Negative for gait problem, joint swelling, myalgias, neck pain and neck stiffness.  Skin: Negative.  Negative for rash.  Allergic/Immunologic: Negative.   Neurological: Positive for paresthesias (in his LLE). Negative for tingling, weakness, numbness and headaches.  Hematological: Negative.  Negative for adenopathy. Does not  bruise/bleed easily.  Psychiatric/Behavioral: Negative.        Objective:   Physical Exam  Vitals reviewed. Constitutional: He appears well-developed and well-nourished. No distress.  HENT:  Head: Normocephalic and atraumatic.  Mouth/Throat: Oropharynx is clear and moist. No oropharyngeal exudate.  Eyes: Conjunctivae are normal. Right eye exhibits no discharge. Left eye exhibits no discharge. No scleral icterus.  Neck: Normal range of motion. Neck supple. No JVD present. No tracheal deviation present. No thyromegaly present.  Cardiovascular: Normal rate, regular rhythm, normal heart sounds and intact distal pulses.  Exam reveals no gallop and no friction rub.   No murmur heard. Pulmonary/Chest: Effort normal and breath sounds normal. No stridor. No respiratory distress. He has no wheezes. He has no rales. He exhibits no tenderness.  Abdominal: Soft. Bowel sounds are normal. He exhibits no distension and no mass. There is no tenderness. There is no rebound and no guarding.  Musculoskeletal: Normal range of motion. He exhibits no edema and no tenderness.  Lymphadenopathy:    He has no cervical adenopathy.  Neurological: He is alert. He has normal strength. He displays no atrophy, no tremor and normal reflexes. No cranial nerve deficit or sensory deficit. He exhibits normal muscle tone. He displays a negative Romberg sign. He displays no seizure activity. Coordination and gait normal.  Reflex Scores:      Tricep reflexes are 1+ on the right side and 1+ on the left side.      Bicep reflexes are 1+ on the right side and 1+ on the left side.      Brachioradialis reflexes are 1+ on the right side and 1+ on the left side.      Patellar reflexes are 0 on the right  side and 0 on the left side.      Achilles reflexes are 0 on the right side and 0 on the left side. Neg SLR in BLE  Skin: Skin is warm and dry. No rash noted. He is not diaphoretic. No erythema. No pallor.     Lab Results    Component Value Date   WBC 5.7 09/26/2013   HGB 15.4 09/26/2013   HCT 43.7 09/26/2013   PLT 264.0 09/26/2013   GLUCOSE 97 09/26/2013   CHOL 264* 09/26/2013   TRIG 517.0 Triglyceride is over 400; calculations on Lipids are invalid.* 09/26/2013   HDL 59.30 09/26/2013   LDLDIRECT 135.2 09/26/2013   LDLCALC 99 02/13/2010   ALT 56* 09/26/2013   AST 53* 09/26/2013   NA 137 09/26/2013   K 4.2 09/26/2013   CL 100 09/26/2013   CREATININE 0.9 09/26/2013   BUN 16 09/26/2013   CO2 28 09/26/2013   TSH 1.76 09/26/2013   PSA 0.33 09/26/2013   HGBA1C 5.2 09/26/2013       Assessment & Plan:

## 2014-07-03 ENCOUNTER — Other Ambulatory Visit: Payer: Self-pay | Admitting: Internal Medicine

## 2014-09-16 ENCOUNTER — Other Ambulatory Visit: Payer: Self-pay | Admitting: Geriatric Medicine

## 2014-09-16 DIAGNOSIS — I10 Essential (primary) hypertension: Secondary | ICD-10-CM

## 2014-09-16 MED ORDER — LOSARTAN POTASSIUM-HCTZ 100-12.5 MG PO TABS: 1.0000 | ORAL_TABLET | Freq: Every day | ORAL | Status: DC

## 2014-10-12 ENCOUNTER — Other Ambulatory Visit: Payer: Self-pay | Admitting: Internal Medicine

## 2014-11-28 ENCOUNTER — Other Ambulatory Visit: Payer: Self-pay | Admitting: Internal Medicine

## 2014-11-28 NOTE — Telephone Encounter (Signed)
MD out of office pls advise on refill.../lmb 

## 2014-11-28 NOTE — Telephone Encounter (Signed)
Faxed script back to [facility].../lmb 

## 2014-12-28 ENCOUNTER — Other Ambulatory Visit: Payer: Self-pay | Admitting: Internal Medicine

## 2015-01-18 ENCOUNTER — Other Ambulatory Visit: Payer: Self-pay | Admitting: Internal Medicine

## 2015-01-20 ENCOUNTER — Telehealth: Payer: Self-pay

## 2015-01-20 NOTE — Telephone Encounter (Signed)
Rx faxed to pharmacy [phone]

## 2015-01-20 NOTE — Telephone Encounter (Signed)
Clearance faxed to [facility].

## 2015-01-27 ENCOUNTER — Ambulatory Visit: Payer: Self-pay | Admitting: Orthopedic Surgery

## 2015-01-27 MED ORDER — DEXAMETHASONE SODIUM PHOSPHATE 4 MG/ML IJ SOLN: 4.0000 mg | Freq: Once | INTRAMUSCULAR | Status: DC

## 2015-01-27 MED ORDER — SODIUM CHLORIDE 0.9 % IV SOLN: 4.0000 mg | Freq: Once | INTRAVENOUS | Status: DC

## 2015-01-27 MED ORDER — ACETAMINOPHEN 10 MG/ML IV SOLN: 1000.0000 mg | Freq: Once | INTRAVENOUS | Status: AC

## 2015-02-17 ENCOUNTER — Ambulatory Visit: Payer: Self-pay | Admitting: Orthopedic Surgery

## 2015-02-17 NOTE — H&P (Signed)
TOTAL HIP ADMISSION H&P  Patient is admitted for left total hip arthroplasty.  Subjective:  Chief Complaint: left hip pain  HPI: Kenneth Singleton, 59 y.o. male, has a history of pain and functional disability in the left hip(s) due to arthritis and patient has failed non-surgical conservative treatments for greater than 12 weeks to include NSAID's and/or analgesics, flexibility and strengthening excercises, use of assistive devices, weight reduction as appropriate and activity modification.  Onset of symptoms was gradual starting 8 years ago with rapidlly worsening course since that time.The patient noted no past surgery on the left hip(s).  Patient currently rates pain in the left hip at 10 out of 10 with activity. Patient has night pain, worsening of pain with activity and weight bearing, pain that interfers with activities of daily living and pain with passive range of motion. Patient has evidence of subchondral cysts, subchondral sclerosis, periarticular osteophytes and joint space narrowing by imaging studies. This condition presents safety issues increasing the risk of falls. There is no current active infection.  Patient Active Problem List   Diagnosis Date Noted  . Back pain, chronic 09/26/2013  . COPD bronchitis 03/12/2013  . Nonspecific elevation of levels of transaminase or lactic acid dehydrogenase (LDH) 02/05/2013  . Chest tightness 02/05/2013  . Other abnormal glucose 08/21/2012  . Routine general medical examination at a health care facility 07/02/2011  . Eczema 07/02/2011  . [place] ALCOHOL DEPEND EPISODIC DRUNKENNESS 02/13/2010  . AVASCULAR NECROSIS, FEMORAL HEAD 02/13/2010  . HYPERLIPIDEMIA 01/31/2009  . HYPERTENSION 01/31/2009   Past Medical History  Diagnosis Date  . Hyperlipidemia   . Hypertension   . Elevated LFTs   . Alcohol abuse     Past Surgical History  Procedure Laterality Date  . Vasectomy       (Not in a hospital admission) Allergies  Allergen  Reactions  . Atorvastatin     REACTION: muscle aches    History  Substance Use Topics  . Smoking status: Former Smoker -- 30 years    Quit date: 07/02/2003  . Smokeless tobacco: Never Used     Comment: Regular exercise- No  . Alcohol Use: 7.2 oz/week    12 Cans of beer per week    Family History  Problem Relation Age of Onset  . Alcohol abuse Other   . Arthritis Other   . Diabetes Other   . Hyperlipidemia Other   . Hypertension Other   . Cancer Neg Hx   . Early death Neg Hx   . Stroke Neg Hx   . Hypertension Father   . Heart disease Father   . Hypertension Brother   . Hypertension Sister   . Lung cancer Sister      Review of Systems  Unable to perform ROS Constitutional: Negative.   HENT: Positive for hearing loss and tinnitus. Negative for congestion, ear discharge, ear pain, nosebleeds and sore throat.   Eyes: Negative.   Respiratory: Negative.  Negative for cough and stridor.   Cardiovascular: Negative.  Negative for chest pain.  Gastrointestinal: Negative.   Genitourinary: Negative.   Musculoskeletal: Negative.        Left hip pain, back pain  Skin: Negative.   Neurological: Negative.  Negative for headaches.  Endo/Heme/Allergies: Negative.   Psychiatric/Behavioral: Negative.     Objective:  Physical Exam  Vitals reviewed. Constitutional: He is oriented to person, place, and time. He appears well-developed and well-nourished.  HENT:  Head: Normocephalic and atraumatic.  Eyes: Pupils are equal, round, and  reactive to light.  Neck: Normal range of motion. Neck supple.  Cardiovascular: Normal rate, regular rhythm, normal heart sounds and intact distal pulses.   Respiratory: Effort normal and breath sounds normal.  GI: Soft. Bowel sounds are normal.  Genitourinary:  deferred  Musculoskeletal:       Left hip: He exhibits decreased range of motion and crepitus.  Neurological: He is alert and oriented to person, place, and time. He has normal reflexes.   Skin: Skin is warm and dry.  Psychiatric: He has a normal mood and affect. His behavior is normal. Judgment and thought content normal.    Vital signs in last 24 hours: @VSRANGES @  Labs:   Estimated body mass index is 21.33 kg/(m^2) as calculated from the following:   Height as of 06/26/14: 5' 7"  (1.702 m).   Weight as of 06/26/14: 61.78 kg (136 lb 3.2 oz).   Imaging Review Plain radiographs demonstrate severe degenerative joint disease of the left hip(s). The bone quality appears to be adequate for age and reported activity level.  Assessment/Plan:  End stage arthritis, left hip(s)  The patient history, physical examination, clinical judgement of the provider and imaging studies are consistent with end stage degenerative joint disease of the left hip(s) and total hip arthroplasty is deemed medically necessary. The treatment options including medical management, injection therapy, arthroscopy and arthroplasty were discussed at length. The risks and benefits of total hip arthroplasty were presented and reviewed. The risks due to aseptic loosening, infection, stiffness, dislocation/subluxation,  thromboembolic complications and other imponderables were discussed.  The patient acknowledged the explanation, agreed to proceed with the plan and consent was signed. Patient is being admitted for inpatient treatment for surgery, pain control, PT, OT, prophylactic antibiotics, VTE prophylaxis, progressive ambulation and ADL's and discharge planning.The patient is planning to be discharged home with home health services

## 2015-02-25 ENCOUNTER — Encounter (HOSPITAL_COMMUNITY): Payer: Self-pay

## 2015-02-25 ENCOUNTER — Encounter (HOSPITAL_COMMUNITY)
Admission: RE | Admit: 2015-02-25 | Discharge: 2015-02-25 | Disposition: A | Discharge status: Home / Self Care | Payer: BLUE CROSS/BLUE SHIELD | Source: Ambulatory Visit | Attending: Orthopedic Surgery | Admitting: Orthopedic Surgery

## 2015-02-25 DIAGNOSIS — M1612 Unilateral primary osteoarthritis, left hip: Secondary | ICD-10-CM | POA: Insufficient documentation

## 2015-02-25 DIAGNOSIS — Z01810 Encounter for preprocedural cardiovascular examination: Secondary | ICD-10-CM | POA: Insufficient documentation

## 2015-02-25 DIAGNOSIS — Z01812 Encounter for preprocedural laboratory examination: Secondary | ICD-10-CM | POA: Insufficient documentation

## 2015-02-25 HISTORY — DX: Gastro-esophageal reflux disease without esophagitis: K21.9

## 2015-02-25 HISTORY — DX: Unspecified osteoarthritis, unspecified site: M19.90

## 2015-02-25 LAB — COMPREHENSIVE METABOLIC PANEL
ALBUMIN: 4.4 g/dL (ref 3.5–5.2)
ALT: 38 U/L (ref 0–53)
AST: 37 U/L (ref 0–37)
Alkaline Phosphatase: 65 U/L (ref 39–117)
Anion gap: 12 (ref 5–15)
BUN: 19 mg/dL (ref 6–23)
CHLORIDE: 99 mmol/L (ref 96–112)
CO2: 27 mmol/L (ref 19–32)
Calcium: 9.7 mg/dL (ref 8.4–10.5)
Creatinine, Ser: 1 mg/dL (ref 0.50–1.35)
GFR calc non Af Amer: 81 mL/min — ABNORMAL LOW (ref >90)
Glucose, Bld: 96 mg/dL (ref 70–99)
POTASSIUM: 4.4 mmol/L (ref 3.5–5.1)
Sodium: 138 mmol/L (ref 135–145)
Total Bilirubin: 0.9 mg/dL (ref 0.3–1.2)
Total Protein: 7.7 g/dL (ref 6.0–8.3)

## 2015-02-25 LAB — URINALYSIS, ROUTINE W REFLEX MICROSCOPIC
BILIRUBIN URINE: NEGATIVE
GLUCOSE, UA: NEGATIVE mg/dL
Hgb urine dipstick: NEGATIVE
Ketones, ur: NEGATIVE mg/dL
Leukocytes, UA: NEGATIVE
Nitrite: NEGATIVE
PROTEIN: NEGATIVE mg/dL
Specific Gravity, Urine: 1.021 (ref 1.005–1.030)
Urobilinogen, UA: 1 mg/dL (ref 0.0–1.0)
pH: 6.5 (ref 5.0–8.0)

## 2015-02-25 LAB — SURGICAL PCR SCREEN
MRSA, PCR: NEGATIVE
Staphylococcus aureus: NEGATIVE

## 2015-02-25 LAB — PROTIME-INR
INR: 0.96 (ref 0.00–1.49)
Prothrombin Time: 12.9 seconds (ref 11.6–15.2)

## 2015-02-25 LAB — CBC
HCT: 46.2 % (ref 39.0–52.0)
Hemoglobin: 15.8 g/dL (ref 13.0–17.0)
MCH: 32.8 pg (ref 26.0–34.0)
MCHC: 34.2 g/dL (ref 30.0–36.0)
MCV: 95.9 fL (ref 78.0–100.0)
PLATELETS: 246 10*3/uL (ref 150–400)
RBC: 4.82 MIL/uL (ref 4.22–5.81)
RDW: 12.2 % (ref 11.5–15.5)
WBC: 6.1 10*3/uL (ref 4.0–10.5)

## 2015-02-25 LAB — APTT: APTT: 32 s (ref 24–37)

## 2015-02-25 NOTE — Patient Instructions (Addendum)
Kenneth Singleton [facility]  02/25/2015   Your procedure is scheduled on:  03/06/2015    Report to [facility] Main  Entrance and follow signs to               [place] at       [place].  Call this number if you have problems the morning of surgery [phone]   Remember:  Do not eat food or drink liquids :After Midnight.     Take these medicines the morning of surgery with A SIP OF WATER:  Caprice Renshaw and bring with you, [place]                               You may not have any metal on your body including hair pins and              piercings  Do not wear jewelry, , lotions, powders or perfumes., deodorant.                          Men may shave face and neck.   Do not bring valuables to the hospital. [place].  Contacts, dentures or bridgework may not be worn into surgery.  Leave suitcase in the car. After surgery it may be brought to your room.         Special Instructions:coughing and deep breathing exercises, leg exercises               Please read over the following fact sheets you were given: _____________________________________________________________________             [facility] - Preparing for Surgery Before surgery, you can play an important role.  Because skin is not sterile, your skin needs to be as free of germs as possible.  You can reduce the number of germs on your skin by washing with CHG (chlorahexidine gluconate) soap before surgery.  CHG is an antiseptic cleaner which kills germs and bonds with the skin to continue killing germs even after washing. Please DO NOT use if you have an allergy to CHG or antibacterial soaps.  If your skin becomes reddened/irritated stop using the CHG and inform your nurse when you arrive at Short Stay. Do not shave (including legs and underarms) for at least 48 hours prior to the first CHG shower.  You may shave your face/neck. Please follow these instructions  carefully:  1.  Shower with CHG Soap the night before surgery and the  morning of Surgery.  2.  If you choose to wash your hair, wash your hair first as usual with your  normal  shampoo.  3.  After you shampoo, rinse your hair and body thoroughly to remove the  shampoo.                           4.  Use CHG as you would any other liquid soap.  You can apply chg directly  to the skin and wash                       Gently with a scrungie or clean washcloth.  5.  Apply the CHG  Soap to your body ONLY FROM THE NECK DOWN.   Do not use on face/ open                           Wound or open sores. Avoid contact with eyes, ears mouth and genitals (private parts).                       Wash face,  Genitals (private parts) with your normal soap.             6.  Wash thoroughly, paying special attention to the area where your surgery  will be performed.  7.  Thoroughly rinse your body with warm water from the neck down.  8.  DO NOT shower/wash with your normal soap after using and rinsing off  the CHG Soap.                9.  Pat yourself dry with a clean towel.            10.  Wear clean pajamas.            11.  Place clean sheets on your bed the night of your first shower and do not  sleep with pets. Day of Surgery : Do not apply any lotions/deodorants the morning of surgery.  Please wear clean clothes to the hospital/surgery center.  FAILURE TO FOLLOW THESE INSTRUCTIONS MAY RESULT IN THE CANCELLATION OF YOUR SURGERY PATIENT SIGNATURE_________________________________  NURSE SIGNATURE__________________________________  ________________________________________________________________________  WHAT IS A BLOOD TRANSFUSION? Blood Transfusion Information  A transfusion is the replacement of blood or some of its parts. Blood is made up of multiple cells which provide different functions.  Red blood cells carry oxygen and are used for blood loss replacement.  White blood cells fight against  infection.  Platelets control bleeding.  Plasma helps clot blood.  Other blood products are available for specialized needs, such as hemophilia or other clotting disorders. BEFORE THE TRANSFUSION  Who gives blood for transfusions?   Healthy volunteers who are fully evaluated to make sure their blood is safe. This is blood bank blood. Transfusion therapy is the safest it has ever been in the practice of medicine. Before blood is taken from a donor, a complete history is taken to make sure that person has no history of diseases nor engages in risky social behavior (examples are intravenous drug use or sexual activity with multiple partners). The donor's travel history is screened to minimize risk of transmitting infections, such as malaria. The donated blood is tested for signs of infectious diseases, such as HIV and hepatitis. The blood is then tested to be sure it is compatible with you in order to minimize the chance of a transfusion reaction. If you or a relative donates blood, this is often done in anticipation of surgery and is not appropriate for emergency situations. It takes many days to process the donated blood. RISKS AND COMPLICATIONS Although transfusion therapy is very safe and saves many lives, the main dangers of transfusion include:  1. Getting an infectious disease. 2. Developing a transfusion reaction. This is an allergic reaction to something in the blood you were given. Every precaution is taken to prevent this. The decision to have a blood transfusion has been considered carefully by your caregiver before blood is given. Blood is not given unless the benefits outweigh the risks. AFTER THE TRANSFUSION  Right after receiving a blood transfusion, you  will usually feel much better and more energetic. This is especially true if your red blood cells have gotten low (anemic). The transfusion raises the level of the red blood cells which carry oxygen, and this usually causes an energy  increase.  The nurse administering the transfusion will monitor you carefully for complications. HOME CARE INSTRUCTIONS  No special instructions are needed after a transfusion. You may find your energy is better. Speak with your caregiver about any limitations on activity for underlying diseases you may have. SEEK MEDICAL CARE IF:   Your condition is not improving after your transfusion.  You develop redness or irritation at the intravenous (IV) site. SEEK IMMEDIATE MEDICAL CARE IF:  Any of the following symptoms occur over the next 12 hours:  Shaking chills.  You have a temperature by mouth above 102 F (38.9 C), not controlled by medicine.  Chest, back, or muscle pain.  People around you feel you are not acting correctly or are confused.  Shortness of breath or difficulty breathing.  Dizziness and fainting.  You get a rash or develop hives.  You have a decrease in urine output.  Your urine turns a dark color or changes to pink, red, or brown. Any of the following symptoms occur over the next 10 days:  You have a temperature by mouth above 102 F (38.9 C), not controlled by medicine.  Shortness of breath.  Weakness after normal activity.  The white part of the eye turns yellow (jaundice).  You have a decrease in the amount of urine or are urinating less often.  Your urine turns a dark color or changes to pink, red, or brown. Document Released: 11/12/2000 Document Revised: 02/07/2012 Document Reviewed: 07/01/2008 Exit[employer] Patient Information 2014 [place].  _______________________________________________________________________  Incentive Spirometer  An incentive spirometer is a tool that can help keep your lungs clear and active. This tool measures how well you are filling your lungs with each breath. Taking long deep breaths may help reverse or decrease the chance of developing breathing (pulmonary) problems (especially infection) following:  A long  period of time when you are unable to move or be active. BEFORE THE PROCEDURE   If the spirometer includes an indicator to show your best effort, your nurse or respiratory therapist will set it to a desired goal.  If possible, sit up straight or lean slightly forward. Try not to slouch.  Hold the incentive spirometer in an upright position. INSTRUCTIONS FOR USE  3. Sit on the edge of your bed if possible, or sit up as far as you can in bed or on a chair. 4. Hold the incentive spirometer in an upright position. 5. Breathe out normally. 6. Place the mouthpiece in your mouth and seal your lips tightly around it. 7. Breathe in slowly and as deeply as possible, raising the piston or the ball toward the top of the column. 8. Hold your breath for 3-5 seconds or for as long as possible. Allow the piston or ball to fall to the bottom of the column. 9. Remove the mouthpiece from your mouth and breathe out normally. 10. Rest for a few seconds and repeat Steps 1 through 7 at least 10 times every 1-2 hours when you are awake. Take your time and take a few normal breaths between deep breaths. 11. The spirometer may include an indicator to show your best effort. Use the indicator as a goal to work toward during each repetition. 12. After each set of 10 deep breaths, practice  coughing to be sure your lungs are clear. If you have an incision (the cut made at the time of surgery), support your incision when coughing by placing a pillow or rolled up towels firmly against it. Once you are able to get out of bed, walk around indoors and cough well. You may stop using the incentive spirometer when instructed by your caregiver.  RISKS AND COMPLICATIONS  Take your time so you do not get dizzy or light-headed.  If you are in pain, you may need to take or ask for pain medication before doing incentive spirometry. It is harder to take a deep breath if you are having pain. AFTER USE  Rest and breathe slowly and  easily.  It can be helpful to keep track of a log of your progress. Your caregiver can provide you with a simple table to help with this. If you are using the spirometer at home, follow these instructions: [place] IF:   You are having difficultly using the spirometer.  You have trouble using the spirometer as often as instructed.  Your pain medication is not giving enough relief while using the spirometer.  You develop fever of 100.5 F (38.1 C) or higher. SEEK IMMEDIATE MEDICAL CARE IF:   You cough up bloody sputum that had not been present before.  You develop fever of 102 F (38.9 C) or greater.  You develop worsening pain at or near the incision site. MAKE SURE YOU:   Understand these instructions.  Will watch your condition.  Will get help right away if you are not doing well or get worse. Document Released: 03/28/2007 Document Revised: 02/07/2012 Document Reviewed: 05/29/2007 [facility] Patient Information 2014 [place], [place].   ________________________________________________________________________

## 2015-02-27 NOTE — Progress Notes (Signed)
Final EKG in EPIC done 02/25/2015.

## 2015-03-06 ENCOUNTER — Encounter (HOSPITAL_COMMUNITY): Payer: Self-pay | Admitting: *Deleted

## 2015-03-06 ENCOUNTER — Inpatient Hospital Stay (HOSPITAL_COMMUNITY): Payer: BLUE CROSS/BLUE SHIELD | Admitting: Certified Registered"

## 2015-03-06 ENCOUNTER — Ambulatory Visit (INDEPENDENT_AMBULATORY_CARE_PROVIDER_SITE_OTHER): Payer: BLUE CROSS/BLUE SHIELD | Admitting: Internal Medicine

## 2015-03-06 ENCOUNTER — Encounter (HOSPITAL_COMMUNITY)
Admission: RE | Disposition: A | Discharge status: Home / Self Care | Payer: Self-pay | Source: Ambulatory Visit | Attending: Orthopedic Surgery

## 2015-03-06 ENCOUNTER — Encounter: Payer: Self-pay | Admitting: Internal Medicine

## 2015-03-06 ENCOUNTER — Ambulatory Visit (HOSPITAL_COMMUNITY)
Admission: RE | Admit: 2015-03-06 | Discharge: 2015-03-06 | Disposition: A | Discharge status: Home / Self Care | Payer: BLUE CROSS/BLUE SHIELD | Source: Ambulatory Visit | Attending: Orthopedic Surgery | Admitting: Orthopedic Surgery

## 2015-03-06 VITALS — BP 130/76 | HR 94 | Temp 98.5°F | Resp 14 | Ht 68.0 in | Wt 211.0 lb

## 2015-03-06 DIAGNOSIS — E785 Hyperlipidemia, unspecified: Secondary | ICD-10-CM | POA: Diagnosis not present

## 2015-03-06 DIAGNOSIS — M549 Dorsalgia, unspecified: Secondary | ICD-10-CM

## 2015-03-06 DIAGNOSIS — Z87891 Personal history of nicotine dependence: Secondary | ICD-10-CM | POA: Insufficient documentation

## 2015-03-06 DIAGNOSIS — S70269A Insect bite (nonvenomous), unspecified hip, initial encounter: Secondary | ICD-10-CM | POA: Insufficient documentation

## 2015-03-06 DIAGNOSIS — S70261A Insect bite (nonvenomous), right hip, initial encounter: Secondary | ICD-10-CM | POA: Diagnosis not present

## 2015-03-06 DIAGNOSIS — M1612 Unilateral primary osteoarthritis, left hip: Secondary | ICD-10-CM | POA: Insufficient documentation

## 2015-03-06 DIAGNOSIS — J449 Chronic obstructive pulmonary disease, unspecified: Secondary | ICD-10-CM | POA: Insufficient documentation

## 2015-03-06 DIAGNOSIS — K219 Gastro-esophageal reflux disease without esophagitis: Secondary | ICD-10-CM | POA: Insufficient documentation

## 2015-03-06 DIAGNOSIS — M879 Osteonecrosis, unspecified: Secondary | ICD-10-CM | POA: Diagnosis not present

## 2015-03-06 DIAGNOSIS — Z79899 Other long term (current) drug therapy: Secondary | ICD-10-CM | POA: Diagnosis not present

## 2015-03-06 DIAGNOSIS — M25552 Pain in left hip: Secondary | ICD-10-CM | POA: Diagnosis not present

## 2015-03-06 DIAGNOSIS — W57XXXA Bitten or stung by nonvenomous insect and other nonvenomous arthropods, initial encounter: Secondary | ICD-10-CM

## 2015-03-06 DIAGNOSIS — G8929 Other chronic pain: Secondary | ICD-10-CM

## 2015-03-06 DIAGNOSIS — F1010 Alcohol abuse, uncomplicated: Secondary | ICD-10-CM | POA: Diagnosis not present

## 2015-03-06 DIAGNOSIS — I10 Essential (primary) hypertension: Secondary | ICD-10-CM | POA: Diagnosis not present

## 2015-03-06 DIAGNOSIS — Z5309 Procedure and treatment not carried out because of other contraindication: Secondary | ICD-10-CM | POA: Insufficient documentation

## 2015-03-06 LAB — TYPE AND SCREEN
ABO/RH(D): A NEG
Antibody Screen: NEGATIVE

## 2015-03-06 LAB — ABO/RH: ABO/RH(D): A NEG

## 2015-03-06 SURGERY — ARTHROPLASTY, HIP, TOTAL, ANTERIOR APPROACH
Anesthesia: General | Site: Hip | Laterality: Left

## 2015-03-06 MED ORDER — HYDROGEN PEROXIDE 3 % EX SOLN
CUTANEOUS | Status: AC
  Filled 2015-03-06: qty 473

## 2015-03-06 MED ORDER — METOCLOPRAMIDE HCL 5 MG/ML IJ SOLN: 5.0000 mg | Freq: Three times a day (TID) | INTRAMUSCULAR | Status: DC | PRN

## 2015-03-06 MED ORDER — KETOROLAC TROMETHAMINE 30 MG/ML IJ SOLN
INTRAMUSCULAR | Status: AC
  Filled 2015-03-06: qty 1

## 2015-03-06 MED ORDER — DOXYCYCLINE HYCLATE 100 MG PO CAPS: 100.0000 mg | ORAL_CAPSULE | Freq: Two times a day (BID) | ORAL | Status: DC

## 2015-03-06 MED ORDER — ONDANSETRON HCL 4 MG/2ML IJ SOLN
INTRAMUSCULAR | Status: DC | PRN
  Administered 2015-03-06: 4 mg via INTRAVENOUS

## 2015-03-06 MED ORDER — DEXAMETHASONE SODIUM PHOSPHATE 10 MG/ML IJ SOLN
INTRAMUSCULAR | Status: AC
  Filled 2015-03-06: qty 1

## 2015-03-06 MED ORDER — ONDANSETRON HCL 4 MG/2ML IJ SOLN
INTRAMUSCULAR | Status: AC
  Filled 2015-03-06: qty 2

## 2015-03-06 MED ORDER — TRANEXAMIC ACID 100 MG/ML IV SOLN
1000.0000 mg | INTRAVENOUS | Status: AC
  Administered 2015-03-06: 500 mg via INTRAVENOUS
  Filled 2015-03-06: qty 10

## 2015-03-06 MED ORDER — PROPOFOL 10 MG/ML IV BOLUS
INTRAVENOUS | Status: AC
  Filled 2015-03-06: qty 20

## 2015-03-06 MED ORDER — TRIAMCINOLONE ACETONIDE 0.5 % EX CREA: 1.0000 "application " | TOPICAL_CREAM | Freq: Three times a day (TID) | CUTANEOUS | Status: DC

## 2015-03-06 MED ORDER — FENTANYL CITRATE 0.05 MG/ML IJ SOLN
INTRAMUSCULAR | Status: AC
  Filled 2015-03-06: qty 2

## 2015-03-06 MED ORDER — ISOPROPYL ALCOHOL 70 % SOLN
Status: AC
  Filled 2015-03-06: qty 480

## 2015-03-06 MED ORDER — LACTATED RINGERS IV SOLN: INTRAVENOUS | Status: DC

## 2015-03-06 MED ORDER — CHLORHEXIDINE GLUCONATE 4 % EX LIQD: 60.0000 mL | Freq: Once | CUTANEOUS | Status: DC

## 2015-03-06 MED ORDER — ONDANSETRON HCL 4 MG PO TABS
4.0000 mg | ORAL_TABLET | Freq: Four times a day (QID) | ORAL | Status: DC | PRN
  Filled 2015-03-06: qty 1

## 2015-03-06 MED ORDER — CEFAZOLIN SODIUM-DEXTROSE 2-3 GM-% IV SOLR
INTRAVENOUS | Status: AC
  Filled 2015-03-06: qty 50

## 2015-03-06 MED ORDER — LIDOCAINE HCL (CARDIAC) 20 MG/ML IV SOLN
INTRAVENOUS | Status: AC
  Filled 2015-03-06: qty 5

## 2015-03-06 MED ORDER — SODIUM CHLORIDE 0.9 % IJ SOLN
INTRAMUSCULAR | Status: AC
  Filled 2015-03-06: qty 50

## 2015-03-06 MED ORDER — HYDROMORPHONE HCL 1 MG/ML IJ SOLN: 0.2500 mg | INTRAMUSCULAR | Status: DC | PRN

## 2015-03-06 MED ORDER — FENTANYL CITRATE 0.05 MG/ML IJ SOLN
INTRAMUSCULAR | Status: DC | PRN
Start: 2015-03-06 — End: 2015-03-06
  Administered 2015-03-06 (×2): 100 ug via INTRAVENOUS

## 2015-03-06 MED ORDER — NEOSTIGMINE METHYLSULFATE 10 MG/10ML IV SOLN
INTRAVENOUS | Status: DC | PRN
  Administered 2015-03-06: 5 mg via INTRAVENOUS

## 2015-03-06 MED ORDER — ROCURONIUM BROMIDE 100 MG/10ML IV SOLN
INTRAVENOUS | Status: DC | PRN
  Administered 2015-03-06: 40 mg via INTRAVENOUS

## 2015-03-06 MED ORDER — LIDOCAINE HCL (CARDIAC) 20 MG/ML IV SOLN
INTRAVENOUS | Status: DC | PRN
Start: 2015-03-06 — End: 2015-03-06
  Administered 2015-03-06: 100 mg via INTRAVENOUS

## 2015-03-06 MED ORDER — GABAPENTIN 100 MG PO CAPS: 100.0000 mg | ORAL_CAPSULE | Freq: Three times a day (TID) | ORAL | Status: DC

## 2015-03-06 MED ORDER — SODIUM CHLORIDE 0.9 % IV SOLN: INTRAVENOUS | Status: DC

## 2015-03-06 MED ORDER — METOCLOPRAMIDE HCL 5 MG PO TABS
5.0000 mg | ORAL_TABLET | Freq: Three times a day (TID) | ORAL | Status: DC | PRN
  Filled 2015-03-06: qty 2

## 2015-03-06 MED ORDER — MIDAZOLAM HCL 5 MG/5ML IJ SOLN
INTRAMUSCULAR | Status: DC | PRN
  Administered 2015-03-06: 2 mg via INTRAVENOUS

## 2015-03-06 MED ORDER — MIDAZOLAM HCL 2 MG/2ML IJ SOLN
INTRAMUSCULAR | Status: AC
  Filled 2015-03-06: qty 2

## 2015-03-06 MED ORDER — CELECOXIB 200 MG PO CAPS: 200.0000 mg | ORAL_CAPSULE | Freq: Every day | ORAL | Status: DC

## 2015-03-06 MED ORDER — GLYCOPYRROLATE 0.2 MG/ML IJ SOLN
INTRAMUSCULAR | Status: DC | PRN
  Administered 2015-03-06: 0.6 mg via INTRAVENOUS

## 2015-03-06 MED ORDER — SUCCINYLCHOLINE CHLORIDE 20 MG/ML IJ SOLN
INTRAMUSCULAR | Status: DC | PRN
  Administered 2015-03-06: 100 mg via INTRAVENOUS

## 2015-03-06 MED ORDER — CEFAZOLIN SODIUM-DEXTROSE 2-3 GM-% IV SOLR
2.0000 g | INTRAVENOUS | Status: AC
  Administered 2015-03-06: 2 g via INTRAVENOUS

## 2015-03-06 MED ORDER — LACTATED RINGERS IV SOLN
INTRAVENOUS | Status: DC | PRN
  Administered 2015-03-06 (×2): via INTRAVENOUS

## 2015-03-06 MED ORDER — PROPOFOL 10 MG/ML IV BOLUS
INTRAVENOUS | Status: DC | PRN
  Administered 2015-03-06: 200 mg via INTRAVENOUS

## 2015-03-06 MED ORDER — BUPIVACAINE-EPINEPHRINE (PF) 0.25% -1:200000 IJ SOLN
INTRAMUSCULAR | Status: AC
  Filled 2015-03-06: qty 30

## 2015-03-06 MED ORDER — ONDANSETRON HCL 4 MG/2ML IJ SOLN: 4.0000 mg | Freq: Four times a day (QID) | INTRAMUSCULAR | Status: DC | PRN

## 2015-03-06 MED ORDER — DEXAMETHASONE SODIUM PHOSPHATE 10 MG/ML IJ SOLN
INTRAMUSCULAR | Status: DC | PRN
  Administered 2015-03-06: 10 mg via INTRAVENOUS

## 2015-03-06 SURGICAL SUPPLY — 43 items
BAG DECANTER FOR FLEXI CONT (MISCELLANEOUS) IMPLANT
BAG ZIPLOCK 12X15 (MISCELLANEOUS) IMPLANT
CHLORAPREP W/TINT 26ML (MISCELLANEOUS) IMPLANT
COVER PERINEAL POST (MISCELLANEOUS) ×3 IMPLANT
DECANTER SPIKE VIAL GLASS SM (MISCELLANEOUS) ×3 IMPLANT
DRAPE C-ARM 42X120 X-RAY (DRAPES) ×3 IMPLANT
DRAPE STERI IOBAN 125X83 (DRAPES) ×3 IMPLANT
DRAPE U-SHAPE 47X51 STRL (DRAPES) ×9 IMPLANT
DRSG AQUACEL AG ADV 3.5X10 (GAUZE/BANDAGES/DRESSINGS) ×3 IMPLANT
ELECT BLADE TIP CTD 4 INCH (ELECTRODE) ×3 IMPLANT
ELECT PENCIL ROCKER SW 15FT (MISCELLANEOUS) ×3 IMPLANT
ELECT REM PT RETURN 15FT ADLT (MISCELLANEOUS) ×3 IMPLANT
FACESHIELD WRAPAROUND (MASK) ×6 IMPLANT
GAUZE SPONGE 4X4 12PLY STRL (GAUZE/BANDAGES/DRESSINGS) ×3 IMPLANT
GLOVE BIO SURGEON STRL SZ8.5 (GLOVE) ×6 IMPLANT
GLOVE BIOGEL PI IND STRL 8.5 (GLOVE) ×1 IMPLANT
GLOVE BIOGEL PI INDICATOR 8.5 (GLOVE) ×2
GOWN SPEC L3 XXLG W/TWL (GOWN DISPOSABLE) ×3 IMPLANT
HANDPIECE INTERPULSE COAX TIP (DISPOSABLE) ×2
HOLDER FOLEY CATH W/STRAP (MISCELLANEOUS) ×3 IMPLANT
HOOD PEEL AWAY FACE SHEILD DIS (HOOD) ×6 IMPLANT
KIT BASIN OR (CUSTOM PROCEDURE TRAY) ×3 IMPLANT
LIQUID BAND (GAUZE/BANDAGES/DRESSINGS) IMPLANT
NEEDLE SPNL 18GX3.5 QUINCKE PK (NEEDLE) ×3 IMPLANT
PACK TOTAL JOINT (CUSTOM PROCEDURE TRAY) ×3 IMPLANT
PEN SKIN MARKING BROAD (MISCELLANEOUS) ×3 IMPLANT
SAW OSC TIP CART 19.5X105X1.3 (SAW) ×3 IMPLANT
SEALER BIPOLAR AQUA 6.0 (INSTRUMENTS) ×3 IMPLANT
SET HNDPC FAN SPRY TIP SCT (DISPOSABLE) ×1 IMPLANT
SOL PREP POV-IOD 4OZ 10% (MISCELLANEOUS) ×3 IMPLANT
SUT ETHIBOND NAB CT1 #1 30IN (SUTURE) ×6 IMPLANT
SUT MNCRL AB 3-0 PS2 18 (SUTURE) ×3 IMPLANT
SUT MON AB 2-0 CT1 36 (SUTURE) ×6 IMPLANT
SUT VIC AB 1 CT1 36 (SUTURE) ×3 IMPLANT
SUT VIC AB 2-0 CT1 27 (SUTURE) ×2
SUT VIC AB 2-0 CT1 TAPERPNT 27 (SUTURE) ×1 IMPLANT
SUT VLOC 180 0 24IN GS25 (SUTURE) ×3 IMPLANT
SYR 50ML LL SCALE MARK (SYRINGE) ×3 IMPLANT
TOWEL OR 17X26 10 PK STRL BLUE (TOWEL DISPOSABLE) ×3 IMPLANT
TOWEL OR NON WOVEN STRL DISP B (DISPOSABLE) ×3 IMPLANT
TRAY FOLEY CATH 14FRSI W/METER (CATHETERS) IMPLANT
WATER STERILE IRR 1500ML POUR (IV SOLUTION) IMPLANT
YANKAUER SUCT BULB TIP 10FT TU (MISCELLANEOUS) ×3 IMPLANT

## 2015-03-06 NOTE — Anesthesia Postprocedure Evaluation (Signed)
  Anesthesia Post-op Note  Patient: Kenneth Singleton.  Procedure(s) Performed: Procedure(s) (LRB): LEFT TOTAL HIP ARTHROPLASTY ANTERIOR APPROACH (Left)  Patient Location: PACU  Anesthesia Type: General  Level of Consciousness: awake and alert   Airway and Oxygen Therapy: Patient Spontanous Breathing  Post-op Pain: mild  Post-op Assessment: Post-op Vital signs reviewed, Patient's Cardiovascular Status Stable, Respiratory Function Stable, Patent Airway and No signs of Nausea or vomiting  Last Vitals:  Filed Vitals:   03/06/15 0936  BP: 135/68  Pulse: 77  Temp: 36.5 C  Resp: 14    Post-op Vital Signs: stable   Complications: No apparent anesthesia complications

## 2015-03-06 NOTE — Patient Instructions (Signed)
Tick Bite Information Ticks are insects that attach themselves to the skin and draw blood for food. There are various types of ticks. Common types include wood ticks and deer ticks. Most ticks live in shrubs and grassy areas. Ticks can climb onto your body when you make contact with leaves or grass where the tick is waiting. The most common places on the body for ticks to attach themselves are the scalp, neck, armpits, waist, and groin. Most tick bites are harmless, but sometimes ticks carry germs that cause diseases. These germs can be spread to a person during the tick's feeding process. The chance of a disease spreading through a tick bite depends on:   The type of tick.  Time of year.   How long the tick is attached.   Geographic location.  HOW CAN YOU PREVENT TICK BITES? Take these steps to help prevent tick bites when you are outdoors:  Wear protective clothing. Long sleeves and long pants are best.   Wear white clothes so you can see ticks more easily.  Tuck your pant legs into your socks.   If walking on a trail, stay in the middle of the trail to avoid brushing against bushes.  Avoid walking through areas with long grass.  Put insect repellent on all exposed skin and along boot tops, pant legs, and sleeve cuffs.   Check clothing, hair, and skin repeatedly and before going inside.   Brush off any ticks that are not attached.  Take a shower or bath as soon as possible after being outdoors.  WHAT IS THE PROPER WAY TO REMOVE A TICK? Ticks should be removed as soon as possible to help prevent diseases caused by tick bites. 1. If latex gloves are available, put them on before trying to remove a tick.  2. Using fine-point tweezers, grasp the tick as close to the skin as possible. You may also use curved forceps or a tick removal tool. Grasp the tick as close to its head as possible. Avoid grasping the tick on its body. 3. Pull gently with steady upward pressure until  the tick lets go. Do not twist the tick or jerk it suddenly. This may break off the tick's head or mouth parts. 4. Do not squeeze or crush the tick's body. This could force disease-carrying fluids from the tick into your body.  5. After the tick is removed, wash the bite area and your hands with soap and water or other disinfectant such as alcohol. 6. Apply a small amount of antiseptic cream or ointment to the bite site.  7. Wash and disinfect any instruments that were used.  Do not try to remove a tick by applying a hot match, petroleum jelly, or fingernail polish to the tick. These methods do not work and may increase the chances of disease being spread from the tick bite.  WHEN SHOULD YOU SEEK MEDICAL CARE? Contact your health care provider if you are unable to remove a tick from your skin or if a part of the tick breaks off and is stuck in the skin.  After a tick bite, you need to be aware of signs and symptoms that could be related to diseases spread by ticks. Contact your health care provider if you develop any of the following in the days or weeks after the tick bite:  Unexplained fever.  Rash. A circular rash that appears days or weeks after the tick bite may indicate the possibility of Lyme disease. The rash may resemble  a target with a bull's-eye and may occur at a different part of your body than the tick bite.  Redness and swelling in the area of the tick bite.   Tender, swollen lymph glands.   Diarrhea.   Weight loss.   Cough.   Fatigue.   Muscle, joint, or bone pain.   Abdominal pain.   Headache.   Lethargy or a change in your level of consciousness.  Difficulty walking or moving your legs.   Numbness in the legs.   Paralysis.  Shortness of breath.   Confusion.   Repeated vomiting.  Document Released: 11/12/2000 Document Revised: 09/05/2013 Document Reviewed: 04/25/2013 [facility] Patient Information 2015 [place], [place]. This information is  not intended to replace advice given to you by your health care provider. Make sure you discuss any questions you have with your health care provider.

## 2015-03-06 NOTE — OR Nursing (Signed)
07April 2016. Case cancelled by MD due to patient having redden area to the operative site. Upon speaking with wife, Patient had a tick in the spot. Newt Minion, rn

## 2015-03-06 NOTE — Progress Notes (Signed)
Patient's wife  Instructed that surgeons's office  Will call and reschedule surgery date and that patient is not to be out in the sun while taking Doxycycline- voices understanding- patient heard instructions as well

## 2015-03-06 NOTE — Interval H&P Note (Signed)
History and Physical Interval Note:  03/06/2015 7:17 AM  Kenneth Offer Sr.  has presented today for surgery, with the diagnosis of LEFT HIP OA  The various methods of treatment have been discussed with the patient and family. After consideration of risks, benefits and other options for treatment, the patient has consented to  Procedure(s): LEFT TOTAL HIP ARTHROPLASTY ANTERIOR APPROACH (Left) as a surgical intervention .  The patient's history has been reviewed, patient examined, no change in status, stable for surgery.  I have reviewed the patient's chart and labs.  Questions were answered to the patient's satisfaction.     Lakendra Helling, Horald Pollen

## 2015-03-06 NOTE — Discharge Instructions (Signed)

## 2015-03-06 NOTE — Anesthesia Preprocedure Evaluation (Addendum)
Anesthesia Evaluation  Patient identified by MRN, date of birth, ID band Patient awake    Reviewed: Allergy & Precautions, H&P , NPO status , Patient's Chart, lab work & pertinent test results  Airway Mallampati: II  TM Distance: >3 FB Neck ROM: full    Dental no notable dental hx. (+) Teeth Intact, Dental Advisory Given   Pulmonary COPD COPD inhaler, former smoker,  Mild COPD breath sounds clear to auscultation  Pulmonary exam normal       Cardiovascular hypertension, Pt. on medications Rhythm:regular Rate:Normal     Neuro/Psych PSYCHIATRIC DISORDERS negative neurological ROS  negative psych ROS   GI/Hepatic negative GI ROS, GERD-  Medicated,(+)     substance abuse  alcohol use, Hx/o Elevated LFT's   Endo/Other  negative endocrine ROSObesity  Renal/GU negative Renal ROS  negative genitourinary   Musculoskeletal  (+) Arthritis -, Osteoarthritis,  Left Hip AVN   Abdominal   Peds  Hematology negative hematology ROS (+)   Anesthesia Other Findings   Reproductive/Obstetrics negative OB ROS                          Anesthesia Physical Anesthesia Plan  ASA: III  Anesthesia Plan: General   Post-op Pain Management:    Induction: Intravenous  Airway Management Planned: Oral ETT  Additional Equipment:   Intra-op Plan:   Post-operative Plan: Extubation in OR  Informed Consent: I have reviewed the patients History and Physical, chart, labs and discussed the procedure including the risks, benefits and alternatives for the proposed anesthesia with the patient or authorized representative who has indicated his/her understanding and acceptance.   Dental advisory given  Plan Discussed with: CRNA, Anesthesiologist and Surgeon  Anesthesia Plan Comments:        Anesthesia Quick Evaluation

## 2015-03-06 NOTE — Assessment & Plan Note (Signed)
Continue doxycycline Try topical steroids for symptom relief Will check a CBC and lyme titer

## 2015-03-06 NOTE — Assessment & Plan Note (Signed)
Will stop the lyrica at his request Switch to neurontin

## 2015-03-06 NOTE — Transfer of Care (Signed)
Immediate Anesthesia Transfer of Care Note  Patient: Kenneth Singleton [facility] Sr.  Procedure(s) Performed: Procedure(s): LEFT TOTAL HIP ARTHROPLASTY ANTERIOR APPROACH (Left)  Patient Location: PACU  Anesthesia Type:General  Level of Consciousness: sedated  Airway & Oxygen Therapy: Patient Spontanous Breathing and Patient connected to face mask oxygen  Post-op Assessment: Report given to RN and Post -op Vital signs reviewed and stable  Post vital signs: Reviewed and stable  Last Vitals: There were no vitals filed for this visit.  Complications: No apparent anesthesia complications

## 2015-03-06 NOTE — Progress Notes (Signed)
Pre visit review using our clinic review tool, if applicable. No additional management support is needed unless otherwise documented below in the visit note. 

## 2015-03-06 NOTE — Progress Notes (Signed)
   Subjective:    Patient ID: Kenneth Offer Sr., male    DOB: February 09, 1956, 59 y.o.   MRN: [id number]  HPI Comments: Tick bite over left hip for 3 days, feels red/swollen/itchy/painful, started doxycycline earlier today. He feels like lyrica causes blurred vision, wants to try something else for low back pain.      Review of Systems  Constitutional: Negative.  Negative for fever, chills, diaphoresis, appetite change and fatigue.  HENT: Negative.  Negative for sore throat and trouble swallowing.   Eyes: Negative.   Respiratory: Negative.  Negative for cough, choking, chest tightness, shortness of breath and stridor.   Cardiovascular: Negative.  Negative for chest pain, palpitations and leg swelling.  Gastrointestinal: Negative.  Negative for nausea, abdominal pain and diarrhea.  Endocrine: Negative.   Genitourinary: Negative.   Musculoskeletal: Positive for back pain and arthralgias. Negative for myalgias, joint swelling, gait problem, neck pain and neck stiffness.  Skin: Positive for color change, rash and wound. Negative for pallor.  Allergic/Immunologic: Negative.   Neurological: Negative.   Hematological: Negative.  Negative for adenopathy. Does not bruise/bleed easily.  Psychiatric/Behavioral: Negative.        Objective:   Physical Exam  Constitutional: He is oriented to person, place, and time. He appears well-developed and well-nourished.  Non-toxic appearance. He does not have a sickly appearance. He does not appear ill. No distress.  HENT:  Head: Normocephalic and atraumatic.  Mouth/Throat: Oropharynx is clear and moist. No oropharyngeal exudate.  Eyes: Conjunctivae are normal. Right eye exhibits no discharge. Left eye exhibits no discharge. No scleral icterus.  Neck: Normal range of motion. Neck supple. No JVD present. No tracheal deviation present. No thyromegaly present.  Cardiovascular: Normal rate, regular rhythm, normal heart sounds and intact distal pulses.  Exam reveals  no gallop and no friction rub.   No murmur heard. Pulmonary/Chest: Effort normal and breath sounds normal. No stridor. No respiratory distress. He has no wheezes. He has no rales. He exhibits no tenderness.  Abdominal: Soft. Bowel sounds are normal. He exhibits no distension and no mass. There is no tenderness. There is no rebound and no guarding.  Musculoskeletal: Normal range of motion. He exhibits no edema or tenderness.       Legs: Lymphadenopathy:    He has no cervical adenopathy.  Neurological: He is oriented to person, place, and time.  Skin: Skin is warm and dry. No rash noted. He is not diaphoretic. There is erythema. No pallor.  Vitals reviewed.   Lab Results  Component Value Date   WBC 6.1 02/25/2015   HGB 15.8 02/25/2015   HCT 46.2 02/25/2015   PLT 246 02/25/2015   GLUCOSE 96 02/25/2015   CHOL 264* 09/26/2013   TRIG * 09/26/2013    517.0 Triglyceride is over 400; calculations on Lipids are invalid.   HDL 59.30 09/26/2013   LDLDIRECT 135.2 09/26/2013   LDLCALC 99 02/13/2010   ALT 38 02/25/2015   AST 37 02/25/2015   NA 138 02/25/2015   K 4.4 02/25/2015   CL 99 02/25/2015   CREATININE 1.00 02/25/2015   BUN 19 02/25/2015   CO2 27 02/25/2015   TSH 1.76 09/26/2013   PSA 0.33 09/26/2013   INR 0.96 02/25/2015   HGBA1C 5.2 09/26/2013        Assessment & Plan:

## 2015-03-06 NOTE — Progress Notes (Signed)
Upon transferring patient to HANA table and removing gown, he was found to have streaking erythema that originates from an indurated area on the left buttock. Spoke with wife who stated he got a tick bite. I felt it is unsafe to place an incision on erythema and recommended cancelling his surgery. She agreed. We will treat with PO doxycycline and reschedule.

## 2015-03-06 NOTE — H&P (View-Only) (Signed)
TOTAL HIP ADMISSION H&P  Patient is admitted for left total hip arthroplasty.  Subjective:  Chief Complaint: left hip pain  HPI: Kenneth Singleton, 59 y.o. male, has a history of pain and functional disability in the left hip(s) due to arthritis and patient has failed non-surgical conservative treatments for greater than 12 weeks to include NSAID's and/or analgesics, flexibility and strengthening excercises, use of assistive devices, weight reduction as appropriate and activity modification.  Onset of symptoms was gradual starting 8 years ago with rapidlly worsening course since that time.The patient noted no past surgery on the left hip(s).  Patient currently rates pain in the left hip at 10 out of 10 with activity. Patient has night pain, worsening of pain with activity and weight bearing, pain that interfers with activities of daily living and pain with passive range of motion. Patient has evidence of subchondral cysts, subchondral sclerosis, periarticular osteophytes and joint space narrowing by imaging studies. This condition presents safety issues increasing the risk of falls. There is no current active infection.  Patient Active Problem List   Diagnosis Date Noted  . Back pain, chronic 09/26/2013  . COPD bronchitis 03/12/2013  . Nonspecific elevation of levels of transaminase or lactic acid dehydrogenase (LDH) 02/05/2013  . Chest tightness 02/05/2013  . Other abnormal glucose 08/21/2012  . Routine general medical examination at a health care facility 07/02/2011  . Eczema 07/02/2011  . [place] ALCOHOL DEPEND EPISODIC DRUNKENNESS 02/13/2010  . AVASCULAR NECROSIS, FEMORAL HEAD 02/13/2010  . HYPERLIPIDEMIA 01/31/2009  . HYPERTENSION 01/31/2009   Past Medical History  Diagnosis Date  . Hyperlipidemia   . Hypertension   . Elevated LFTs   . Alcohol abuse     Past Surgical History  Procedure Laterality Date  . Vasectomy       (Not in a hospital admission) Allergies  Allergen  Reactions  . Atorvastatin     REACTION: muscle aches    History  Substance Use Topics  . Smoking status: Former Smoker -- 30 years    Quit date: 07/02/2003  . Smokeless tobacco: Never Used     Comment: Regular exercise- No  . Alcohol Use: 7.2 oz/week    12 Cans of beer per week    Family History  Problem Relation Age of Onset  . Alcohol abuse Other   . Arthritis Other   . Diabetes Other   . Hyperlipidemia Other   . Hypertension Other   . Cancer Neg Hx   . Early death Neg Hx   . Stroke Neg Hx   . Hypertension Father   . Heart disease Father   . Hypertension Brother   . Hypertension Sister   . Lung cancer Sister      Review of Systems  Unable to perform ROS Constitutional: Negative.   HENT: Positive for hearing loss and tinnitus. Negative for congestion, ear discharge, ear pain, nosebleeds and sore throat.   Eyes: Negative.   Respiratory: Negative.  Negative for cough and stridor.   Cardiovascular: Negative.  Negative for chest pain.  Gastrointestinal: Negative.   Genitourinary: Negative.   Musculoskeletal: Negative.        Left hip pain, back pain  Skin: Negative.   Neurological: Negative.  Negative for headaches.  Endo/Heme/Allergies: Negative.   Psychiatric/Behavioral: Negative.     Objective:  Physical Exam  Vitals reviewed. Constitutional: He is oriented to person, place, and time. He appears well-developed and well-nourished.  HENT:  Head: Normocephalic and atraumatic.  Eyes: Pupils are equal, round, and  reactive to light.  Neck: Normal range of motion. Neck supple.  Cardiovascular: Normal rate, regular rhythm, normal heart sounds and intact distal pulses.   Respiratory: Effort normal and breath sounds normal.  GI: Soft. Bowel sounds are normal.  Genitourinary:  deferred  Musculoskeletal:       Left hip: He exhibits decreased range of motion and crepitus.  Neurological: He is alert and oriented to person, place, and time. He has normal reflexes.   Skin: Skin is warm and dry.  Psychiatric: He has a normal mood and affect. His behavior is normal. Judgment and thought content normal.    Vital signs in last 24 hours: @VSRANGES @  Labs:   Estimated body mass index is 21.33 kg/(m^2) as calculated from the following:   Height as of 06/26/14: 5' 7"  (1.702 m).   Weight as of 06/26/14: 61.78 kg (136 lb 3.2 oz).   Imaging Review Plain radiographs demonstrate severe degenerative joint disease of the left hip(s). The bone quality appears to be adequate for age and reported activity level.  Assessment/Plan:  End stage arthritis, left hip(s)  The patient history, physical examination, clinical judgement of the provider and imaging studies are consistent with end stage degenerative joint disease of the left hip(s) and total hip arthroplasty is deemed medically necessary. The treatment options including medical management, injection therapy, arthroscopy and arthroplasty were discussed at length. The risks and benefits of total hip arthroplasty were presented and reviewed. The risks due to aseptic loosening, infection, stiffness, dislocation/subluxation,  thromboembolic complications and other imponderables were discussed.  The patient acknowledged the explanation, agreed to proceed with the plan and consent was signed. Patient is being admitted for inpatient treatment for surgery, pain control, PT, OT, prophylactic antibiotics, VTE prophylaxis, progressive ambulation and ADL's and discharge planning.The patient is planning to be discharged home with home health services

## 2015-03-10 ENCOUNTER — Encounter: Payer: Self-pay | Admitting: *Deleted

## 2015-03-11 ENCOUNTER — Ambulatory Visit: Payer: Self-pay | Admitting: Orthopedic Surgery

## 2015-03-11 MED ORDER — SODIUM CHLORIDE 0.9 % IV SOLN: 4.0000 mg | Freq: Once | INTRAVENOUS | Status: DC

## 2015-03-11 MED ORDER — ACETAMINOPHEN 10 MG/ML IV SOLN: 1000.0000 mg | Freq: Once | INTRAVENOUS | Status: AC

## 2015-03-11 MED ORDER — DEXAMETHASONE SODIUM PHOSPHATE 4 MG/ML IJ SOLN: 4.0000 mg | Freq: Once | INTRAMUSCULAR | Status: DC

## 2015-03-14 ENCOUNTER — Encounter: Payer: Self-pay | Admitting: Internal Medicine

## 2015-03-25 NOTE — Patient Instructions (Addendum)
Kenneth Singleton [facility]  03/25/2015   Your procedure is scheduled on: 04/03/2015    Report to [facility] Main  Entrance and follow signs to               [place] at   12 noonAM.  Call this number if you have problems the morning of surgery [phone]   Remember:  Clear liquids until 0730am then nothing by mouth.     CLEAR LIQUID DIET   Foods Allowed                                                                     Foods Excluded  Coffee and tea, regular and decaf                             liquids that you cannot  Plain Jell-O in any flavor                                             see through such as: Fruit ices (not with fruit pulp)                                     milk, soups, orange juice  Iced Popsicles                                    All solid food Carbonated beverages, regular and diet                                    Cranberry, grape and apple juices Sports drinks like Gatorade Lightly seasoned clear broth or consume(fat free) Sugar, honey syrup  Sample Menu Breakfast                                Lunch                                     Supper Cranberry juice                    Beef broth                            Chicken broth Jell-O                                     Grape juice                           Apple juice  Coffee or tea                        Jell-O                                      Popsicle                                                Coffee or tea                        Coffee or tea  _____________________________________________________________________    Do not eat food or drink liquids :After Midnight.     Take these medicines the morning of surgery with A SIP OF WATER:   Prilosec , Tudorza Inhaler and bring                                You may not have any metal on your body including hair pins and              piercings  Do not wear jewelry,  lotions, powders or perfumes., deodorant               Men may shave face and neck.   Do not bring valuables to the hospital. [name].  Contacts, dentures or bridgework may not be worn into surgery.  Leave suitcase in the car. After surgery it may be brought to your room.         Special Instructions: coughing and deep breathing exercises, leg exercises               Please read over the following fact sheets you were given: _____________________________________________________________________             [facility] - Preparing for Surgery Before surgery, you can play an important role.  Because skin is not sterile, your skin needs to be as free of germs as possible.  You can reduce the number of germs on your skin by washing with CHG (chlorahexidine gluconate) soap before surgery.  CHG is an antiseptic cleaner which kills germs and bonds with the skin to continue killing germs even after washing. Please DO NOT use if you have an allergy to CHG or antibacterial soaps.  If your skin becomes reddened/irritated stop using the CHG and inform your nurse when you arrive at Short Stay. Do not shave (including legs and underarms) for at least 48 hours prior to the first CHG shower.  You may shave your face/neck. Please follow these instructions carefully:  1.  Shower with CHG Soap the night before surgery and the  morning of Surgery.  2.  If you choose to wash your hair, wash your hair first as usual with your  normal  shampoo.  3.  After you shampoo, rinse your hair and body thoroughly to remove the  shampoo.                           4.  Use CHG as  you would any other liquid soap.  You can apply chg directly  to the skin and wash                       Gently with a scrungie or clean washcloth.  5.  Apply the CHG Soap to your body ONLY FROM THE NECK DOWN.   Do not use on face/ open                           Wound or open sores. Avoid contact with eyes, ears mouth and genitals (private parts).                        Wash face,  Genitals (private parts) with your normal soap.             6.  Wash thoroughly, paying special attention to the area where your surgery  will be performed.  7.  Thoroughly rinse your body with warm water from the neck down.  8.  DO NOT shower/wash with your normal soap after using and rinsing off  the CHG Soap.                9.  Pat yourself dry with a clean towel.            10.  Wear clean pajamas.            11.  Place clean sheets on your bed the night of your first shower and do not  sleep with pets. Day of Surgery : Do not apply any lotions/deodorants the morning of surgery.  Please wear clean clothes to the hospital/surgery center.  FAILURE TO FOLLOW THESE INSTRUCTIONS MAY RESULT IN THE CANCELLATION OF YOUR SURGERY PATIENT SIGNATURE_________________________________  NURSE SIGNATURE__________________________________  ________________________________________________________________________  WHAT IS A BLOOD TRANSFUSION? Blood Transfusion Information  A transfusion is the replacement of blood or some of its parts. Blood is made up of multiple cells which provide different functions.  Red blood cells carry oxygen and are used for blood loss replacement.  White blood cells fight against infection.  Platelets control bleeding.  Plasma helps clot blood.  Other blood products are available for specialized needs, such as hemophilia or other clotting disorders. BEFORE THE TRANSFUSION  Who gives blood for transfusions?   Healthy volunteers who are fully evaluated to make sure their blood is safe. This is blood bank blood. Transfusion therapy is the safest it has ever been in the practice of medicine. Before blood is taken from a donor, a complete history is taken to make sure that person has no history of diseases nor engages in risky social behavior (examples are intravenous drug use or sexual activity with multiple partners). The donor's travel history is  screened to minimize risk of transmitting infections, such as malaria. The donated blood is tested for signs of infectious diseases, such as HIV and hepatitis. The blood is then tested to be sure it is compatible with you in order to minimize the chance of a transfusion reaction. If you or a relative donates blood, this is often done in anticipation of surgery and is not appropriate for emergency situations. It takes many days to process the donated blood. RISKS AND COMPLICATIONS Although transfusion therapy is very safe and saves many lives, the main dangers of transfusion include:  1. Getting an infectious disease. 2. Developing a transfusion reaction. This is an allergic reaction  to something in the blood you were given. Every precaution is taken to prevent this. The decision to have a blood transfusion has been considered carefully by your caregiver before blood is given. Blood is not given unless the benefits outweigh the risks. AFTER THE TRANSFUSION  Right after receiving a blood transfusion, you will usually feel much better and more energetic. This is especially true if your red blood cells have gotten low (anemic). The transfusion raises the level of the red blood cells which carry oxygen, and this usually causes an energy increase.  The nurse administering the transfusion will monitor you carefully for complications. HOME CARE INSTRUCTIONS  No special instructions are needed after a transfusion. You may find your energy is better. Speak with your caregiver about any limitations on activity for underlying diseases you may have. SEEK MEDICAL CARE IF:   Your condition is not improving after your transfusion.  You develop redness or irritation at the intravenous (IV) site. SEEK IMMEDIATE MEDICAL CARE IF:  Any of the following symptoms occur over the next 12 hours:  Shaking chills.  You have a temperature by mouth above 102 F (38.9 C), not controlled by medicine.  Chest, back, or  muscle pain.  People around you feel you are not acting correctly or are confused.  Shortness of breath or difficulty breathing.  Dizziness and fainting.  You get a rash or develop hives.  You have a decrease in urine output.  Your urine turns a dark color or changes to pink, red, or brown. Any of the following symptoms occur over the next 10 days:  You have a temperature by mouth above 102 F (38.9 C), not controlled by medicine.  Shortness of breath.  Weakness after normal activity.  The white part of the eye turns yellow (jaundice).  You have a decrease in the amount of urine or are urinating less often.  Your urine turns a dark color or changes to pink, red, or brown. Document Released: 11/12/2000 Document Revised: 02/07/2012 Document Reviewed: 07/01/2008 ExitCare Patient Information 2014 [place].  _______________________________________________________________________  Incentive Spirometer  An incentive spirometer is a tool that can help keep your lungs clear and active. This tool measures how well you are filling your lungs with each breath. Taking long deep breaths may help reverse or decrease the chance of developing breathing (pulmonary) problems (especially infection) following:  A long period of time when you are unable to move or be active. BEFORE THE PROCEDURE   If the spirometer includes an indicator to show your best effort, your nurse or respiratory therapist will set it to a desired goal.  If possible, sit up straight or lean slightly forward. Try not to slouch.  Hold the incentive spirometer in an upright position. INSTRUCTIONS FOR USE  3. Sit on the edge of your bed if possible, or sit up as far as you can in bed or on a chair. 4. Hold the incentive spirometer in an upright position. 5. Breathe out normally. 6. Place the mouthpiece in your mouth and seal your lips tightly around it. 7. Breathe in slowly and as deeply as possible, raising the  piston or the ball toward the top of the column. 8. Hold your breath for 3-5 seconds or for as long as possible. Allow the piston or ball to fall to the bottom of the column. 9. Remove the mouthpiece from your mouth and breathe out normally. 10. Rest for a few seconds and repeat Steps 1 through 7 at least  10 times every 1-2 hours when you are awake. Take your time and take a few normal breaths between deep breaths. 11. The spirometer may include an indicator to show your best effort. Use the indicator as a goal to work toward during each repetition. 12. After each set of 10 deep breaths, practice coughing to be sure your lungs are clear. If you have an incision (the cut made at the time of surgery), support your incision when coughing by placing a pillow or rolled up towels firmly against it. Once you are able to get out of bed, walk around indoors and cough well. You may stop using the incentive spirometer when instructed by your caregiver.  RISKS AND COMPLICATIONS  Take your time so you do not get dizzy or light-headed.  If you are in pain, you may need to take or ask for pain medication before doing incentive spirometry. It is harder to take a deep breath if you are having pain. AFTER USE  Rest and breathe slowly and easily.  It can be helpful to keep track of a log of your progress. Your caregiver can provide you with a simple table to help with this. If you are using the spirometer at home, follow these instructions: [place] IF:   You are having difficultly using the spirometer.  You have trouble using the spirometer as often as instructed.  Your pain medication is not giving enough relief while using the spirometer.  You develop fever of 100.5 F (38.1 C) or higher. SEEK IMMEDIATE MEDICAL CARE IF:   You cough up bloody sputum that had not been present before.  You develop fever of 102 F (38.9 C) or greater.  You develop worsening pain at or near the incision  site. MAKE SURE YOU:   Understand these instructions.  Will watch your condition.  Will get help right away if you are not doing well or get worse. Document Released: 03/28/2007 Document Revised: 02/07/2012 Document Reviewed: 05/29/2007 [employer] Patient Information 2014 [place], [place].   ________________________________________________________________________

## 2015-03-26 ENCOUNTER — Telehealth: Payer: Self-pay | Admitting: Internal Medicine

## 2015-03-26 ENCOUNTER — Other Ambulatory Visit: Payer: Self-pay | Admitting: Internal Medicine

## 2015-03-26 ENCOUNTER — Encounter (HOSPITAL_COMMUNITY): Payer: Self-pay

## 2015-03-26 ENCOUNTER — Encounter (HOSPITAL_COMMUNITY)
Admission: RE | Admit: 2015-03-26 | Discharge: 2015-03-26 | Disposition: A | Discharge status: Home / Self Care | Payer: BLUE CROSS/BLUE SHIELD | Source: Ambulatory Visit | Attending: Orthopedic Surgery | Admitting: Orthopedic Surgery

## 2015-03-26 DIAGNOSIS — Z01812 Encounter for preprocedural laboratory examination: Secondary | ICD-10-CM | POA: Diagnosis not present

## 2015-03-26 DIAGNOSIS — M1612 Unilateral primary osteoarthritis, left hip: Secondary | ICD-10-CM | POA: Diagnosis not present

## 2015-03-26 DIAGNOSIS — G4730 Sleep apnea, unspecified: Secondary | ICD-10-CM

## 2015-03-26 LAB — CBC
HCT: 43.8 % (ref 39.0–52.0)
Hemoglobin: 15 g/dL (ref 13.0–17.0)
MCH: 32.5 pg (ref 26.0–34.0)
MCHC: 34.2 g/dL (ref 30.0–36.0)
MCV: 94.8 fL (ref 78.0–100.0)
PLATELETS: 228 10*3/uL (ref 150–400)
RBC: 4.62 MIL/uL (ref 4.22–5.81)
RDW: 12.3 % (ref 11.5–15.5)
WBC: 6.1 10*3/uL (ref 4.0–10.5)

## 2015-03-26 LAB — COMPREHENSIVE METABOLIC PANEL
ALBUMIN: 4.5 g/dL (ref 3.5–5.2)
ALT: 44 U/L (ref 0–53)
ANION GAP: 7 (ref 5–15)
AST: 39 U/L — AB (ref 0–37)
Alkaline Phosphatase: 74 U/L (ref 39–117)
BILIRUBIN TOTAL: 0.8 mg/dL (ref 0.3–1.2)
BUN: 19 mg/dL (ref 6–23)
CHLORIDE: 105 mmol/L (ref 96–112)
CO2: 25 mmol/L (ref 19–32)
Calcium: 9.3 mg/dL (ref 8.4–10.5)
Creatinine, Ser: 1 mg/dL (ref 0.50–1.35)
GFR calc Af Amer: >90 mL/min (ref >90)
GFR calc non Af Amer: 81 mL/min — ABNORMAL LOW (ref >90)
GLUCOSE: 106 mg/dL — AB (ref 70–99)
Potassium: 4.2 mmol/L (ref 3.5–5.1)
Sodium: 137 mmol/L (ref 135–145)
TOTAL PROTEIN: 7.6 g/dL (ref 6.0–8.3)

## 2015-03-26 LAB — URINALYSIS, ROUTINE W REFLEX MICROSCOPIC
Bilirubin Urine: NEGATIVE
Glucose, UA: NEGATIVE mg/dL
Hgb urine dipstick: NEGATIVE
Ketones, ur: NEGATIVE mg/dL
Leukocytes, UA: NEGATIVE
Nitrite: NEGATIVE
Protein, ur: NEGATIVE mg/dL
SPECIFIC GRAVITY, URINE: 1.023 (ref 1.005–1.030)
Urobilinogen, UA: 0.2 mg/dL (ref 0.0–1.0)
pH: 5.5 (ref 5.0–8.0)

## 2015-03-26 LAB — APTT: aPTT: 29 seconds (ref 24–37)

## 2015-03-26 LAB — PROTIME-INR
INR: 0.94 (ref 0.00–1.49)
Prothrombin Time: 12.7 seconds (ref 11.6–15.2)

## 2015-03-26 LAB — SURGICAL PCR SCREEN
MRSA, PCR: NEGATIVE
STAPHYLOCOCCUS AUREUS: NEGATIVE

## 2015-03-26 NOTE — Progress Notes (Signed)
Patient scored a 5 on the STOP BANG Tool during a preop visit.  This patient is considered High Risk for Obstructive Sleep Apnea using this tool .  FYI.

## 2015-03-26 NOTE — Progress Notes (Signed)
EKG- 02/25/15 EPIC  Stress Test 2014 EPIC  Patient surgery cancelled on 03/06/2015 due to tick bite.  Patient had round doxycycline which he has completed.  Patient was seen by Dr T. Jones-03/06/2015  Note in [place].  Patient was placed on the Dosycycline and topical cream . Patient states at time of preop appointment no further problems with tick bite.  No signs of infection per patient.

## 2015-03-26 NOTE — Telephone Encounter (Signed)
Received phone call from [facility] @ [facility]. She stated she called the patient regarding this referral and the patient was not aware he needed to see a pulmonary specialist. [name] stated she attempted to explain to the patient that this was something that came up during his pre-op visit, however patient stated it must be a misunderstanding. Can someone call the patient and explain to him why he needs this? Also, [facility] does not have any openings until 04/15/15.

## 2015-03-27 NOTE — Telephone Encounter (Signed)
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

## 2015-03-28 ENCOUNTER — Ambulatory Visit: Payer: Self-pay | Admitting: Orthopedic Surgery

## 2015-03-28 NOTE — H&P (Signed)
TOTAL HIP ADMISSION H&P  Patient is admitted for left total hip arthroplasty.  Subjective:  Chief Complaint: left hip pain  HPI: Kenneth Singleton, 59 y.o. male, has a history of pain and functional disability in the left hip(s) due to arthritis and patient has failed non-surgical conservative treatments for greater than 12 weeks to include NSAID's and/or analgesics, flexibility and strengthening excercises, use of assistive devices, weight reduction as appropriate and activity modification. Onset of symptoms was gradual starting 8 years ago with rapidlly worsening course since that time.The patient noted no past surgery on the left hip(s). Patient currently rates pain in the left hip at 10 out of 10 with activity. Patient has night pain, worsening of pain with activity and weight bearing, pain that interfers with activities of daily living and pain with passive range of motion. Patient has evidence of subchondral cysts, subchondral sclerosis, periarticular osteophytes and joint space narrowing by imaging studies. This condition presents safety issues increasing the risk of falls.There is no current active infection.  Patient Active Problem List   Diagnosis Date Noted  . Back pain, chronic 09/26/2013  . COPD bronchitis 03/12/2013  . Nonspecific elevation of levels of transaminase or lactic acid dehydrogenase (LDH) 02/05/2013  . Chest tightness 02/05/2013  . Other abnormal glucose 08/21/2012  . Routine general medical examination at a health care facility 07/02/2011  . Eczema 07/02/2011  . [place] ALCOHOL DEPEND EPISODIC DRUNKENNESS 02/13/2010  . AVASCULAR NECROSIS, FEMORAL HEAD 02/13/2010  . HYPERLIPIDEMIA 01/31/2009  . HYPERTENSION 01/31/2009   Past Medical History  Diagnosis Date  . Hyperlipidemia   . Hypertension   . Elevated LFTs   . Alcohol abuse     Past Surgical History  Procedure Laterality Date  . Vasectomy       (Not in a hospital admission) Allergies  Allergen Reactions  . Atorvastatin     REACTION: muscle aches    History  Substance Use Topics  . Smoking status: Former Smoker -- 30 years    Quit date: 07/02/2003  . Smokeless tobacco: Never Used     Comment: Regular exercise- No  . Alcohol Use: 7.2 oz/week    12 Cans of beer per week    Family History  Problem Relation Age of Onset  . Alcohol abuse Other   . Arthritis Other   . Diabetes Other   . Hyperlipidemia Other   . Hypertension Other   . Cancer Neg Hx   . Early death Neg Hx   . Stroke Neg Hx   . Hypertension Father   . Heart disease Father   . Hypertension Brother   . Hypertension Sister   . Lung cancer Sister      Review of Systems  Unable to perform ROS Constitutional: Negative.  HENT: Positive for hearing loss and tinnitus. Negative for congestion, ear discharge, ear pain, nosebleeds and sore throat.  Eyes: Negative.  Respiratory: Negative. Negative for cough and stridor.  Cardiovascular: Negative. Negative for chest pain.  Gastrointestinal: Negative.  Genitourinary: Negative.  Musculoskeletal: Negative.   Left hip pain, back pain  Skin: Negative.  Neurological: Negative. Negative for headaches.  Endo/Heme/Allergies: Negative.  Psychiatric/Behavioral: Negative.    Objective:  Physical Exam  Vitals reviewed. Constitutional: He is oriented to person, place, and time. He appears well-developed and well-nourished.  HENT:  Head: Normocephalic and atraumatic.  Eyes: Pupils are equal, round, and reactive to light.  Neck: Normal range of motion. Neck supple.  Cardiovascular: Normal rate, regular rhythm, normal heart sounds and  intact distal pulses.  Respiratory: Effort normal and breath sounds normal.  GI: Soft. Bowel sounds are normal.  Genitourinary:  deferred  Musculoskeletal:   Left hip:  He exhibits decreased range of motion and crepitus.  Neurological: He is alert and oriented to person, place, and time. He has normal reflexes.  Skin: Skin is warm and dry.  Psychiatric: He has a normal mood and affect. His behavior is normal. Judgment and thought content normal.    Vital signs in last 24 hours: @VSRANGES @  Labs:   Estimated body mass index is 21.33 kg/(m^2) as calculated from the following:  Height as of 06/26/14: 5' 7"  (1.702 m).  Weight as of 06/26/14: 61.78 kg (136 lb 3.2 oz).   Imaging Review Plain radiographs demonstrate severe degenerative joint disease of the left hip(s). The bone quality appears to be adequate for age and reported activity level.  Assessment/Plan:  End stage arthritis, left hip(s)  The patient history, physical examination, clinical judgement of the provider and imaging studies are consistent with end stage degenerative joint disease of the left hip(s) and total hip arthroplasty is deemed medically necessary. The treatment options including medical management, injection therapy, arthroscopy and arthroplasty were discussed at length. The risks and benefits of total hip arthroplasty were presented and reviewed. The risks due to aseptic loosening, infection, stiffness, dislocation/subluxation, thromboembolic complications and other imponderables were discussed. The patient acknowledged the explanation, agreed to proceed with the plan and consent was signed. Patient is being admitted for inpatient treatment for surgery, pain control, PT, OT, prophylactic antibiotics, VTE prophylaxis, progressive ambulation and ADL's and discharge planning.The patient is planning to be discharged home with home health services

## 2015-03-31 ENCOUNTER — Ambulatory Visit: Payer: Self-pay | Admitting: Orthopedic Surgery

## 2015-04-03 ENCOUNTER — Inpatient Hospital Stay (HOSPITAL_COMMUNITY): Payer: BLUE CROSS/BLUE SHIELD | Admitting: Certified Registered Nurse Anesthetist

## 2015-04-03 ENCOUNTER — Inpatient Hospital Stay (HOSPITAL_COMMUNITY): Payer: BLUE CROSS/BLUE SHIELD

## 2015-04-03 ENCOUNTER — Encounter (HOSPITAL_COMMUNITY)
Admission: RE | Disposition: A | Discharge status: Home / Self Care | Payer: Self-pay | Source: Ambulatory Visit | Attending: Orthopedic Surgery

## 2015-04-03 ENCOUNTER — Inpatient Hospital Stay (HOSPITAL_COMMUNITY)
Admission: RE | Admit: 2015-04-03 | Discharge: 2015-04-04 | DRG: 470 | Disposition: A | Discharge status: Home / Self Care | Payer: BLUE CROSS/BLUE SHIELD | Source: Ambulatory Visit | Attending: Orthopedic Surgery | Admitting: Orthopedic Surgery

## 2015-04-03 ENCOUNTER — Encounter (HOSPITAL_COMMUNITY): Payer: Self-pay | Admitting: *Deleted

## 2015-04-03 DIAGNOSIS — Z419 Encounter for procedure for purposes other than remedying health state, unspecified: Secondary | ICD-10-CM

## 2015-04-03 DIAGNOSIS — M87852 Other osteonecrosis, left femur: Secondary | ICD-10-CM | POA: Diagnosis present

## 2015-04-03 DIAGNOSIS — Z888 Allergy status to other drugs, medicaments and biological substances status: Secondary | ICD-10-CM

## 2015-04-03 DIAGNOSIS — J449 Chronic obstructive pulmonary disease, unspecified: Secondary | ICD-10-CM | POA: Diagnosis present

## 2015-04-03 DIAGNOSIS — I10 Essential (primary) hypertension: Secondary | ICD-10-CM | POA: Diagnosis present

## 2015-04-03 DIAGNOSIS — K219 Gastro-esophageal reflux disease without esophagitis: Secondary | ICD-10-CM | POA: Diagnosis present

## 2015-04-03 DIAGNOSIS — Z87891 Personal history of nicotine dependence: Secondary | ICD-10-CM

## 2015-04-03 DIAGNOSIS — Z86010 Personal history of colonic polyps: Secondary | ICD-10-CM

## 2015-04-03 DIAGNOSIS — M1612 Unilateral primary osteoarthritis, left hip: Principal | ICD-10-CM | POA: Diagnosis present

## 2015-04-03 DIAGNOSIS — M25552 Pain in left hip: Secondary | ICD-10-CM | POA: Diagnosis present

## 2015-04-03 DIAGNOSIS — Z9852 Vasectomy status: Secondary | ICD-10-CM | POA: Diagnosis not present

## 2015-04-03 DIAGNOSIS — E785 Hyperlipidemia, unspecified: Secondary | ICD-10-CM | POA: Diagnosis present

## 2015-04-03 DIAGNOSIS — M1990 Unspecified osteoarthritis, unspecified site: Secondary | ICD-10-CM | POA: Diagnosis present

## 2015-04-03 DIAGNOSIS — Z09 Encounter for follow-up examination after completed treatment for conditions other than malignant neoplasm: Secondary | ICD-10-CM

## 2015-04-03 HISTORY — PX: TOTAL HIP ARTHROPLASTY: SHX124

## 2015-04-03 LAB — TYPE AND SCREEN
ABO/RH(D): A NEG
ANTIBODY SCREEN: NEGATIVE

## 2015-04-03 SURGERY — ARTHROPLASTY, HIP, TOTAL, ANTERIOR APPROACH
Anesthesia: General | Site: Hip | Laterality: Left

## 2015-04-03 MED ORDER — SUFENTANIL CITRATE 50 MCG/ML IV SOLN
INTRAVENOUS | Status: AC
  Filled 2015-04-03: qty 1

## 2015-04-03 MED ORDER — NEOSTIGMINE METHYLSULFATE 10 MG/10ML IV SOLN
INTRAVENOUS | Status: DC | PRN
  Administered 2015-04-03: 3 mg via INTRAVENOUS

## 2015-04-03 MED ORDER — FENTANYL CITRATE (PF) 100 MCG/2ML IJ SOLN
INTRAMUSCULAR | Status: AC
  Filled 2015-04-03: qty 2

## 2015-04-03 MED ORDER — LORAZEPAM 1 MG PO TABS: 0.0000 mg | ORAL_TABLET | Freq: Four times a day (QID) | ORAL | Status: DC

## 2015-04-03 MED ORDER — HYDROMORPHONE HCL 1 MG/ML IJ SOLN
INTRAMUSCULAR | Status: AC
  Filled 2015-04-03: qty 1

## 2015-04-03 MED ORDER — MIDAZOLAM HCL 2 MG/2ML IJ SOLN
INTRAMUSCULAR | Status: AC
  Filled 2015-04-03: qty 2

## 2015-04-03 MED ORDER — TIOTROPIUM BROMIDE MONOHYDRATE 18 MCG IN CAPS
18.0000 ug | ORAL_CAPSULE | Freq: Every day | RESPIRATORY_TRACT | Status: DC
  Administered 2015-04-04: 18 ug via RESPIRATORY_TRACT
  Filled 2015-04-03: qty 5

## 2015-04-03 MED ORDER — SODIUM CHLORIDE 0.9 % IJ SOLN
INTRAMUSCULAR | Status: AC
  Filled 2015-04-03: qty 10

## 2015-04-03 MED ORDER — KETAMINE HCL 10 MG/ML IJ SOLN
INTRAMUSCULAR | Status: AC
  Filled 2015-04-03: qty 1

## 2015-04-03 MED ORDER — KETAMINE HCL 10 MG/ML IJ SOLN
INTRAMUSCULAR | Status: DC | PRN
  Administered 2015-04-03: 30 mg via INTRAVENOUS

## 2015-04-03 MED ORDER — BUPIVACAINE-EPINEPHRINE 0.25% -1:200000 IJ SOLN
INTRAMUSCULAR | Status: DC | PRN
  Administered 2015-04-03: 30 mL

## 2015-04-03 MED ORDER — PROPOFOL 10 MG/ML IV BOLUS
INTRAVENOUS | Status: AC
  Filled 2015-04-03: qty 20

## 2015-04-03 MED ORDER — HYDROMORPHONE HCL 1 MG/ML IJ SOLN
0.2500 mg | INTRAMUSCULAR | Status: DC | PRN
  Administered 2015-04-03 (×2): 0.5 mg via INTRAVENOUS

## 2015-04-03 MED ORDER — METOCLOPRAMIDE HCL 10 MG PO TABS: 5.0000 mg | ORAL_TABLET | Freq: Three times a day (TID) | ORAL | Status: DC | PRN

## 2015-04-03 MED ORDER — DOCUSATE SODIUM 100 MG PO CAPS
100.0000 mg | ORAL_CAPSULE | Freq: Two times a day (BID) | ORAL | Status: DC
  Administered 2015-04-03 – 2015-04-04 (×2): 100 mg via ORAL

## 2015-04-03 MED ORDER — PHENOL 1.4 % MT LIQD: 1.0000 | OROMUCOSAL | Status: DC | PRN

## 2015-04-03 MED ORDER — HYDROMORPHONE HCL 2 MG/ML IJ SOLN
INTRAMUSCULAR | Status: AC
  Filled 2015-04-03: qty 1

## 2015-04-03 MED ORDER — KETOROLAC TROMETHAMINE 30 MG/ML IJ SOLN
INTRAMUSCULAR | Status: AC
  Filled 2015-04-03: qty 1

## 2015-04-03 MED ORDER — CHLORHEXIDINE GLUCONATE 4 % EX LIQD: 60.0000 mL | Freq: Once | CUTANEOUS | Status: DC

## 2015-04-03 MED ORDER — SODIUM CHLORIDE 0.9 % IJ SOLN
INTRAMUSCULAR | Status: AC
  Filled 2015-04-03: qty 50

## 2015-04-03 MED ORDER — LABETALOL HCL 5 MG/ML IV SOLN
INTRAVENOUS | Status: DC | PRN
  Administered 2015-04-03: 2.5 mg via INTRAVENOUS

## 2015-04-03 MED ORDER — ISOPROPYL ALCOHOL 70 % SOLN
Status: DC | PRN
  Administered 2015-04-03: 1 via TOPICAL

## 2015-04-03 MED ORDER — VITAMIN C 500 MG PO TABS
1000.0000 mg | ORAL_TABLET | Freq: Two times a day (BID) | ORAL | Status: DC
  Administered 2015-04-03 – 2015-04-04 (×2): 1000 mg via ORAL
  Filled 2015-04-03 (×3): qty 2

## 2015-04-03 MED ORDER — HYDROGEN PEROXIDE 3 % EX SOLN
CUTANEOUS | Status: AC
Start: 2015-04-03 — End: 2015-04-03
  Filled 2015-04-03: qty 473

## 2015-04-03 MED ORDER — VITAMIN B-6 100 MG PO TABS
100.0000 mg | ORAL_TABLET | Freq: Every day | ORAL | Status: DC
  Administered 2015-04-04: 100 mg via ORAL
  Filled 2015-04-03: qty 1

## 2015-04-03 MED ORDER — KETOROLAC TROMETHAMINE 15 MG/ML IJ SOLN
15.0000 mg | Freq: Four times a day (QID) | INTRAMUSCULAR | Status: DC
  Administered 2015-04-03 – 2015-04-04 (×3): 15 mg via INTRAVENOUS
  Filled 2015-04-03 (×4): qty 1

## 2015-04-03 MED ORDER — LIDOCAINE HCL (CARDIAC) 20 MG/ML IV SOLN
INTRAVENOUS | Status: DC | PRN
  Administered 2015-04-03: 75 mg via INTRAVENOUS
  Administered 2015-04-03: 25 mg via INTRATRACHEAL

## 2015-04-03 MED ORDER — THIAMINE HCL 100 MG/ML IJ SOLN
100.0000 mg | Freq: Every day | INTRAMUSCULAR | Status: DC
  Filled 2015-04-03 (×2): qty 1

## 2015-04-03 MED ORDER — SUFENTANIL CITRATE 50 MCG/ML IV SOLN
INTRAVENOUS | Status: DC | PRN
  Administered 2015-04-03: 10 ug via INTRAVENOUS
  Administered 2015-04-03 (×2): 15 ug via INTRAVENOUS
  Administered 2015-04-03: 10 ug via INTRAVENOUS
  Administered 2015-04-03: 15 ug via INTRAVENOUS
  Administered 2015-04-03: 10 ug via INTRAVENOUS
  Administered 2015-04-03: 15 ug via INTRAVENOUS
  Administered 2015-04-03 (×2): 10 ug via INTRAVENOUS

## 2015-04-03 MED ORDER — ACETAMINOPHEN 650 MG RE SUPP: 650.0000 mg | Freq: Four times a day (QID) | RECTAL | Status: DC | PRN

## 2015-04-03 MED ORDER — MIDAZOLAM HCL 5 MG/5ML IJ SOLN
INTRAMUSCULAR | Status: DC | PRN
  Administered 2015-04-03 (×2): 2 mg via INTRAVENOUS

## 2015-04-03 MED ORDER — LACTATED RINGERS IV SOLN
INTRAVENOUS | Status: DC | PRN
  Administered 2015-04-03 (×4): via INTRAVENOUS

## 2015-04-03 MED ORDER — WATER FOR IRRIGATION, STERILE IR SOLN
Status: DC | PRN
  Administered 2015-04-03: 1500 mL

## 2015-04-03 MED ORDER — SODIUM CHLORIDE 0.9 % IR SOLN
Status: DC | PRN
  Administered 2015-04-03: 3000 mL

## 2015-04-03 MED ORDER — ONDANSETRON HCL 4 MG PO TABS: 4.0000 mg | ORAL_TABLET | Freq: Four times a day (QID) | ORAL | Status: DC | PRN

## 2015-04-03 MED ORDER — FENTANYL CITRATE (PF) 100 MCG/2ML IJ SOLN
INTRAMUSCULAR | Status: DC | PRN
  Administered 2015-04-03: 100 ug via INTRAVENOUS

## 2015-04-03 MED ORDER — DEXTROSE 5 % IV SOLN
500.0000 mg | Freq: Once | INTRAVENOUS | Status: DC
  Administered 2015-04-03: 500 mg via INTRAVENOUS
  Filled 2015-04-03: qty 5

## 2015-04-03 MED ORDER — DEXAMETHASONE SODIUM PHOSPHATE 10 MG/ML IJ SOLN
INTRAMUSCULAR | Status: DC | PRN
  Administered 2015-04-03: 10 mg via INTRAVENOUS

## 2015-04-03 MED ORDER — PRAVASTATIN SODIUM 80 MG PO TABS
80.0000 mg | ORAL_TABLET | Freq: Every day | ORAL | Status: DC
  Filled 2015-04-03: qty 1

## 2015-04-03 MED ORDER — HYDROCODONE-ACETAMINOPHEN 5-325 MG PO TABS
1.0000 | ORAL_TABLET | ORAL | Status: DC | PRN
  Administered 2015-04-03 (×2): 1 via ORAL
  Administered 2015-04-04 (×3): 2 via ORAL
  Filled 2015-04-03 (×2): qty 2
  Filled 2015-04-03: qty 1
  Filled 2015-04-03: qty 2
  Filled 2015-04-03: qty 1

## 2015-04-03 MED ORDER — GABAPENTIN 100 MG PO CAPS
100.0000 mg | ORAL_CAPSULE | Freq: Three times a day (TID) | ORAL | Status: DC
  Administered 2015-04-03 – 2015-04-04 (×2): 100 mg via ORAL
  Filled 2015-04-03 (×4): qty 1

## 2015-04-03 MED ORDER — KETOROLAC TROMETHAMINE 30 MG/ML IJ SOLN
INTRAMUSCULAR | Status: DC | PRN
  Administered 2015-04-03: 30 mg via INTRAVENOUS

## 2015-04-03 MED ORDER — HYDROCHLOROTHIAZIDE 12.5 MG PO CAPS
12.5000 mg | ORAL_CAPSULE | Freq: Every day | ORAL | Status: DC
  Administered 2015-04-04: 12.5 mg via ORAL
  Filled 2015-04-03: qty 1

## 2015-04-03 MED ORDER — MENTHOL 3 MG MT LOZG
1.0000 | LOZENGE | OROMUCOSAL | Status: DC | PRN
  Administered 2015-04-04: 3 mg via ORAL
  Filled 2015-04-03: qty 9

## 2015-04-03 MED ORDER — LACTATED RINGERS IV SOLN
INTRAVENOUS | Status: DC
  Administered 2015-04-03: 1000 mL via INTRAVENOUS

## 2015-04-03 MED ORDER — LOSARTAN POTASSIUM-HCTZ 100-12.5 MG PO TABS: 1.0000 | ORAL_TABLET | Freq: Every day | ORAL | Status: DC

## 2015-04-03 MED ORDER — SUCCINYLCHOLINE CHLORIDE 20 MG/ML IJ SOLN
INTRAMUSCULAR | Status: DC | PRN
  Administered 2015-04-03: 120 mg via INTRAVENOUS

## 2015-04-03 MED ORDER — ACETAMINOPHEN 325 MG PO TABS
650.0000 mg | ORAL_TABLET | Freq: Four times a day (QID) | ORAL | Status: DC | PRN
Start: 2015-04-03 — End: 2015-04-04

## 2015-04-03 MED ORDER — ONDANSETRON HCL 4 MG/2ML IJ SOLN
INTRAMUSCULAR | Status: DC | PRN
  Administered 2015-04-03: 4 mg via INTRAVENOUS

## 2015-04-03 MED ORDER — PROPOFOL 10 MG/ML IV BOLUS
INTRAVENOUS | Status: DC | PRN
Start: 2015-04-03 — End: 2015-04-03
  Administered 2015-04-03: 50 mg via INTRAVENOUS
  Administered 2015-04-03: 200 mg via INTRAVENOUS
  Administered 2015-04-03 (×2): 50 mg via INTRAVENOUS

## 2015-04-03 MED ORDER — SODIUM CHLORIDE 0.9 % IV SOLN: INTRAVENOUS | Status: DC

## 2015-04-03 MED ORDER — PANTOPRAZOLE SODIUM 40 MG PO TBEC
40.0000 mg | DELAYED_RELEASE_TABLET | Freq: Every day | ORAL | Status: DC
  Administered 2015-04-04: 40 mg via ORAL
  Filled 2015-04-03: qty 1

## 2015-04-03 MED ORDER — HYDROMORPHONE HCL 1 MG/ML IJ SOLN
0.5000 mg | INTRAMUSCULAR | Status: DC | PRN
  Administered 2015-04-03 – 2015-04-04 (×2): 0.5 mg via INTRAVENOUS
  Filled 2015-04-03 (×2): qty 1

## 2015-04-03 MED ORDER — SENNA 8.6 MG PO TABS
2.0000 | ORAL_TABLET | Freq: Every day | ORAL | Status: DC
  Administered 2015-04-03: 17.2 mg via ORAL

## 2015-04-03 MED ORDER — CEFAZOLIN SODIUM-DEXTROSE 2-3 GM-% IV SOLR
INTRAVENOUS | Status: AC
  Filled 2015-04-03: qty 50

## 2015-04-03 MED ORDER — METOCLOPRAMIDE HCL 5 MG/ML IJ SOLN
5.0000 mg | Freq: Three times a day (TID) | INTRAMUSCULAR | Status: DC | PRN
Start: 2015-04-03 — End: 2015-04-04

## 2015-04-03 MED ORDER — TRANEXAMIC ACID 1000 MG/10ML IV SOLN
1000.0000 mg | INTRAVENOUS | Status: AC
  Administered 2015-04-03: 1000 mg via INTRAVENOUS
  Filled 2015-04-03: qty 10

## 2015-04-03 MED ORDER — SODIUM CHLORIDE 0.9 % IV SOLN
INTRAVENOUS | Status: DC
  Administered 2015-04-03 – 2015-04-04 (×2): via INTRAVENOUS

## 2015-04-03 MED ORDER — ONDANSETRON HCL 4 MG/2ML IJ SOLN: 4.0000 mg | Freq: Four times a day (QID) | INTRAMUSCULAR | Status: DC | PRN

## 2015-04-03 MED ORDER — DEXAMETHASONE SODIUM PHOSPHATE 10 MG/ML IJ SOLN
10.0000 mg | Freq: Once | INTRAMUSCULAR | Status: AC
  Administered 2015-04-04: 10 mg via INTRAVENOUS
  Filled 2015-04-03: qty 1

## 2015-04-03 MED ORDER — ROCURONIUM BROMIDE 100 MG/10ML IV SOLN
INTRAVENOUS | Status: DC | PRN
  Administered 2015-04-03: 5 mg via INTRAVENOUS
  Administered 2015-04-03: 45 mg via INTRAVENOUS

## 2015-04-03 MED ORDER — FOLIC ACID 1 MG PO TABS
1.0000 mg | ORAL_TABLET | Freq: Every day | ORAL | Status: DC
  Administered 2015-04-03 – 2015-04-04 (×2): 1 mg via ORAL
  Filled 2015-04-03 (×2): qty 1

## 2015-04-03 MED ORDER — SODIUM CHLORIDE 0.9 % IJ SOLN
INTRAMUSCULAR | Status: DC | PRN
  Administered 2015-04-03: 30 mL via INTRAVENOUS

## 2015-04-03 MED ORDER — VITAMIN B-1 100 MG PO TABS
100.0000 mg | ORAL_TABLET | Freq: Every day | ORAL | Status: DC
  Administered 2015-04-03 – 2015-04-04 (×2): 100 mg via ORAL
  Filled 2015-04-03 (×2): qty 1

## 2015-04-03 MED ORDER — LORAZEPAM 1 MG PO TABS: 0.0000 mg | ORAL_TABLET | Freq: Two times a day (BID) | ORAL | Status: DC

## 2015-04-03 MED ORDER — METHOCARBAMOL 500 MG PO TABS
500.0000 mg | ORAL_TABLET | Freq: Four times a day (QID) | ORAL | Status: DC | PRN
  Administered 2015-04-04 (×2): 500 mg via ORAL
  Filled 2015-04-03 (×2): qty 1

## 2015-04-03 MED ORDER — BUPIVACAINE-EPINEPHRINE (PF) 0.25% -1:200000 IJ SOLN
INTRAMUSCULAR | Status: AC
  Filled 2015-04-03: qty 30

## 2015-04-03 MED ORDER — CEFAZOLIN SODIUM-DEXTROSE 2-3 GM-% IV SOLR
2.0000 g | INTRAVENOUS | Status: AC
  Administered 2015-04-03: 2 g via INTRAVENOUS

## 2015-04-03 MED ORDER — ASPIRIN EC 325 MG PO TBEC
325.0000 mg | DELAYED_RELEASE_TABLET | Freq: Every day | ORAL | Status: DC
Start: 2015-04-04 — End: 2015-04-04
  Administered 2015-04-04: 325 mg via ORAL
  Filled 2015-04-03 (×2): qty 1

## 2015-04-03 MED ORDER — ISOPROPYL ALCOHOL 70 % SOLN
Status: AC
  Filled 2015-04-03: qty 480

## 2015-04-03 MED ORDER — CEFAZOLIN SODIUM-DEXTROSE 2-3 GM-% IV SOLR
2.0000 g | Freq: Four times a day (QID) | INTRAVENOUS | Status: AC
  Administered 2015-04-03 – 2015-04-04 (×2): 2 g via INTRAVENOUS
  Filled 2015-04-03 (×2): qty 50

## 2015-04-03 MED ORDER — LOSARTAN POTASSIUM 50 MG PO TABS
100.0000 mg | ORAL_TABLET | Freq: Every day | ORAL | Status: DC
  Administered 2015-04-04: 100 mg via ORAL
  Filled 2015-04-03: qty 2

## 2015-04-03 MED ORDER — HYDROMORPHONE HCL 1 MG/ML IJ SOLN
INTRAMUSCULAR | Status: DC | PRN
  Administered 2015-04-03: 2 mg via INTRAVENOUS

## 2015-04-03 MED ORDER — GLYCOPYRROLATE 0.2 MG/ML IJ SOLN
INTRAMUSCULAR | Status: DC | PRN
  Administered 2015-04-03: 0.4 mg via INTRAVENOUS

## 2015-04-03 MED ORDER — LORAZEPAM 1 MG PO TABS
1.0000 mg | ORAL_TABLET | Freq: Four times a day (QID) | ORAL | Status: DC | PRN
  Administered 2015-04-04: 1 mg via ORAL
  Filled 2015-04-03: qty 1

## 2015-04-03 MED ORDER — LORAZEPAM 2 MG/ML IJ SOLN: 1.0000 mg | Freq: Four times a day (QID) | INTRAMUSCULAR | Status: DC | PRN

## 2015-04-03 SURGICAL SUPPLY — 45 items
BAG DECANTER FOR FLEXI CONT (MISCELLANEOUS) IMPLANT
BAG ZIPLOCK 12X15 (MISCELLANEOUS) IMPLANT
CAPT HIP TOTAL 2 ×3 IMPLANT
CHLORAPREP W/TINT 26ML (MISCELLANEOUS) ×3 IMPLANT
COVER PERINEAL POST (MISCELLANEOUS) ×3 IMPLANT
DECANTER SPIKE VIAL GLASS SM (MISCELLANEOUS) ×3 IMPLANT
DRAPE C-ARM 42X120 X-RAY (DRAPES) ×3 IMPLANT
DRAPE STERI IOBAN 125X83 (DRAPES) ×3 IMPLANT
DRAPE U-SHAPE 47X51 STRL (DRAPES) ×9 IMPLANT
DRSG AQUACEL AG ADV 3.5X10 (GAUZE/BANDAGES/DRESSINGS) ×3 IMPLANT
ELECT BLADE TIP CTD 4 INCH (ELECTRODE) ×3 IMPLANT
ELECT PENCIL ROCKER SW 15FT (MISCELLANEOUS) ×3 IMPLANT
ELECT REM PT RETURN 15FT ADLT (MISCELLANEOUS) ×3 IMPLANT
FACESHIELD WRAPAROUND (MASK) ×6 IMPLANT
GAUZE SPONGE 4X4 12PLY STRL (GAUZE/BANDAGES/DRESSINGS) ×3 IMPLANT
GLOVE BIO SURGEON STRL SZ8.5 (GLOVE) ×6 IMPLANT
GLOVE BIOGEL PI IND STRL 8.5 (GLOVE) ×1 IMPLANT
GLOVE BIOGEL PI INDICATOR 8.5 (GLOVE) ×2
GOWN SPEC L3 XXLG W/TWL (GOWN DISPOSABLE) ×3 IMPLANT
HANDPIECE INTERPULSE COAX TIP (DISPOSABLE) ×2
HOLDER FOLEY CATH W/STRAP (MISCELLANEOUS) ×3 IMPLANT
HOOD PEEL AWAY FACE SHEILD DIS (HOOD) ×6 IMPLANT
KIT BASIN OR (CUSTOM PROCEDURE TRAY) ×3 IMPLANT
LIQUID BAND (GAUZE/BANDAGES/DRESSINGS) ×3 IMPLANT
NEEDLE SPNL 18GX3.5 QUINCKE PK (NEEDLE) ×3 IMPLANT
PACK TOTAL JOINT (CUSTOM PROCEDURE TRAY) ×3 IMPLANT
PEN SKIN MARKING BROAD (MISCELLANEOUS) ×3 IMPLANT
SAW OSC TIP CART 19.5X105X1.3 (SAW) ×3 IMPLANT
SEALER BIPOLAR AQUA 6.0 (INSTRUMENTS) ×3 IMPLANT
SET HNDPC FAN SPRY TIP SCT (DISPOSABLE) ×1 IMPLANT
SOL PREP POV-IOD 4OZ 10% (MISCELLANEOUS) ×3 IMPLANT
SUT ETHIBOND NAB CT1 #1 30IN (SUTURE) ×6 IMPLANT
SUT MNCRL AB 3-0 PS2 18 (SUTURE) ×3 IMPLANT
SUT MON AB 2-0 CT1 36 (SUTURE) ×6 IMPLANT
SUT VIC AB 1 CT1 36 (SUTURE) ×3 IMPLANT
SUT VIC AB 2-0 CT1 27 (SUTURE) ×2
SUT VIC AB 2-0 CT1 TAPERPNT 27 (SUTURE) ×1 IMPLANT
SUT VLOC 180 0 24IN GS25 (SUTURE) ×3 IMPLANT
SYR 50ML LL SCALE MARK (SYRINGE) ×3 IMPLANT
TOWEL OR 17X26 10 PK STRL BLUE (TOWEL DISPOSABLE) ×3 IMPLANT
TOWEL OR NON WOVEN STRL DISP B (DISPOSABLE) ×3 IMPLANT
TRAY FOLEY CATH 16FRSI W/METER (SET/KITS/TRAYS/PACK) ×3 IMPLANT
TRAY FOLEY W/METER SILVER 14FR (SET/KITS/TRAYS/PACK) IMPLANT
WATER STERILE IRR 1500ML POUR (IV SOLUTION) ×3 IMPLANT
YANKAUER SUCT BULB TIP 10FT TU (MISCELLANEOUS) ×3 IMPLANT

## 2015-04-03 NOTE — Discharge Instructions (Signed)
°Dr. Maureen Duesing °Joint Replacement Specialist °[facility] °[address] °[place], [place] [address] °[phone] ° ° °TOTAL HIP REPLACEMENT POSTOPERATIVE DIRECTIONS ° ° ° °Hip Rehabilitation, Guidelines Following Surgery  ° °WEIGHT BEARING °Weight bearing as tolerated with assist device (walker, cane, etc) as directed, use it as long as suggested by your surgeon or therapist, typically at least 4-6 weeks. ° °The results of a hip operation are greatly improved after range of motion and muscle strengthening exercises. Follow all safety measures which are given to protect your hip. If any of these exercises cause increased pain or swelling in your joint, decrease the amount until you are comfortable again. Then slowly increase the exercises. Call your caregiver if you have problems or questions.  ° °HOME CARE INSTRUCTIONS  °Most of the following instructions are designed to prevent the dislocation of your new hip.  °Remove items at home which could result in a fall. This includes throw rugs or furniture in walking pathways.  °Continue medications as instructed at time of discharge. °· You may have some home medications which will be placed on hold until you complete the course of blood thinner medication. °· You may start showering once you are discharged home. Do not remove your dressing. °Do not put on socks or shoes without following the instructions of your caregivers.   °Sit on chairs with arms. Use the chair arms to help push yourself up when arising.  °Arrange for the use of a toilet seat elevator so you are not sitting low.  °· Walk with walker as instructed.  °You may resume a sexual relationship in one month or when given the OK by your caregiver.  °Use walker as long as suggested by your caregivers.  °You may put full weight on your legs and walk as much as is comfortable. °Avoid periods of inactivity such as sitting longer than an hour when not asleep. This helps prevent  blood clots.  °You may return to work once you are cleared by your surgeon.  °Do not drive a car for 6 weeks or until released by your surgeon.  °Do not drive while taking narcotics.  °Wear elastic stockings for two weeks following surgery during the day but you may remove then at night.  °Make sure you keep all of your appointments after your operation with all of your doctors and caregivers. You should call the office at the above phone number and make an appointment for approximately two weeks after the date of your surgery. °Please pick up a stool softener and laxative for home use as long as you are requiring pain medications. °· ICE to the affected hip every three hours for 30 minutes at a time and then as needed for pain and swelling. Continue to use ice on the hip for pain and swelling from surgery. You may notice swelling that will progress down to the foot and ankle.  This is normal after surgery.  Elevate the leg when you are not up walking on it.   °It is important for you to complete the blood thinner medication as prescribed by your doctor. °· Continue to use the breathing machine which will help keep your temperature down.  It is common for your temperature to cycle up and down following surgery, especially at night when you are not up moving around and exerting yourself.  The breathing machine keeps your lungs expanded and your temperature down. ° °RANGE OF MOTION AND STRENGTHENING EXERCISES  °These exercises are   designed to help you keep full movement of your hip joint. Follow your caregiver's or physical therapist's instructions. Perform all exercises about fifteen times, three times per day or as directed. Exercise both hips, even if you have had only one joint replacement. These exercises can be done on a training (exercise) mat, on the floor, on a table or on a bed. Use whatever works the best and is most comfortable for you. Use music or television while you are exercising so that the exercises  are a pleasant break in your day. This will make your life better with the exercises acting as a break in routine you can look forward to.  °Lying on your back, slowly slide your foot toward your buttocks, raising your knee up off the floor. Then slowly slide your foot back down until your leg is straight again.  °Lying on your back spread your legs as far apart as you can without causing discomfort.  °Lying on your side, raise your upper leg and foot straight up from the floor as far as is comfortable. Slowly lower the leg and repeat.  °Lying on your back, tighten up the muscle in the front of your thigh (quadriceps muscles). You can do this by keeping your leg straight and trying to raise your heel off the floor. This helps strengthen the largest muscle supporting your knee.  °Lying on your back, tighten up the muscles of your buttocks both with the legs straight and with the knee bent at a comfortable angle while keeping your heel on the floor.  ° °SKILLED REHAB INSTRUCTIONS: °If the patient is transferred to a skilled rehab facility following release from the hospital, a list of the current medications will be sent to the facility for the patient to continue.  When discharged from the skilled rehab facility, please have the facility set up the patient's Home Health Physical Therapy prior to being released. Also, the skilled facility will be responsible for providing the patient with their medications at time of release from the facility to include their pain medication and their blood thinner medication. If the patient is still at the rehab facility at time of the two week follow up appointment, the skilled rehab facility will also need to assist the patient in arranging follow up appointment in our office and any transportation needs. ° °MAKE SURE YOU:  °Understand these instructions.  °Will watch your condition.  °Will get help right away if you are not doing well or get worse. ° °Pick up stool softner and  laxative for home use following surgery while on pain medications. °Do not remove your dressing. °The dressing is waterproof--it is OK to take showers. °Continue to use ice for pain and swelling after surgery. °Do not use any lotions or creams on the incision until instructed by your surgeon. °Total Hip Protocol. ° ° °

## 2015-04-03 NOTE — Transfer of Care (Signed)
Immediate Anesthesia Transfer of Care Note  Patient: Kenneth Singleton [facility]  Procedure(s) Performed: Procedure(s): LEFT TOTAL HIP ARTHROPLASTY ANTERIOR APPROACH (Left)  Patient Location: PACU  Anesthesia Type:General  Level of Consciousness: awake, alert , oriented and patient cooperative  Airway & Oxygen Therapy: Patient Spontanous Breathing and Patient connected to face mask oxygen  Post-op Assessment: Report given to RN, Post -op Vital signs reviewed and stable and Patient moving all extremities X 4  Post vital signs: stable  Last Vitals:  Filed Vitals:   04/03/15 1150  BP: 136/72  Pulse: 86  Temp: 37.4 C  Resp: 18    Complications: No apparent anesthesia complications

## 2015-04-03 NOTE — Op Note (Signed)
OPERATIVE REPORT  SURGEON: Rod Can, MD   ASSISTANT: Jennette Bill, PA-C.  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   PROCEDURE: Left total hip arthroplasty, anterior approach.   IMPLANTS: DePuy Tri Lock stem, size 4, standard offset. DePuy Pinnacle Cup, size 56 mm. DePuy Altrx liner, size 36 by 56 mm, neutral. DePuy Biolox ceramic head ball, size 36 + 5 mm.  ANESTHESIA:  Spinal  ESTIMATED BLOOD LOSS: 700 mL.  ANTIBIOTICS: 2g ancef.  DRAINS: None.  COMPLICATIONS: None.   CONDITION: PACU - hemodynamically stable.Marland Kitchen   BRIEF CLINICAL NOTE: Kenneth Singleton [facility] is a 59 y.o. male with a long-standing history of Left hip arthritis. After failing conservative management, the patient was indicated for total hip arthroplasty. The risks, benefits, and alternatives to the procedure were explained, and the patient elected to proceed.  PROCEDURE IN DETAIL: Surgical site was marked by myself. Spinal anesthesia was obtained in the pre-op holding area. Once inside the operative room, a foley catheter was inserted. The patient was then positioned on the Hana table. All bony prominences were well padded. The hip was prepped and draped in the normal sterile surgical fashion. A time-out was called verifying side and site of surgery. The patient received IV antibiotics within 60 minutes of beginning the procedure.  The direct anterior approach to the hip was performed through the Hueter interval. Lateral femoral circumflex vessels were treated with the Auqumantys. The anterior capsule was exposed and an inverted T capsulotomy was made.The femoral neck cut was made to the level of the templated cut. A corkscrew was placed into the head and the head was removed. The femoral head was found to have delaminated cartilage and eburnated bone. The head was passed to the back table  and was measured.  Acetabular exposure was achieved, and the pulvinar and labrum were excised. Sequental reaming of the acetabulum was then performed up to a size 55 mm reamer. A 56 mm cup was then opened and impacted into place at approximately 40 degrees of abduction and 20 degrees of anteversion. The final polyethylene liner was impacted into place and acetabular osteophytes were removed.   I then gained femoral exposure taking care to protect the abductors and greater trochanter. This was performed using standard external rotation, extension, and adduction. The capsule was peeled off the inner aspect of the greater trochanter, taking care to preserve the short external rotators. A cookie cutter was used to enter the femoral canal, and then the femoral canal finder was placed. Sequential broaching was performed up to a size 4. Calcar planer was used on the femoral neck remnant. I paced a standard offset neck and a trial head ball. The hip was reduced. Leg lengths and offset were checked fluoroscopically. The hip was dislocated and trial components were removed. The final implants were placed, and the hip was reduced.  Fluoroscopy was used to confirm component position and leg lengths. At 90 degrees of external rotation and full extension, the hip was stable to an anterior directed force.  The wound was copiously irrigated with a dilute betadine solution followed by normal saline. Marcaine solution was injected into the periarticular soft tissue. The wound was closed in layers using #1 Vicryl and V-Loc for the fascia, 2-0 Vicryl for the subcutaneous fat, 2-0 Monocryl for the deep dermal layer, 3-0 running Monocryl subcuticular stitch, and Dermabond for the skin. Once the glue was fully dried, an Aquacell  Ag dressing was applied. The patient was transported to the recovery room in stable condition. Sponge, needle, and instrument counts were correct at the end of the case x2. The patient  tolerated the procedure well and there were no known complications.  Please note that a surgical assistant was a medical necessity for this procedure to perform it in a safe and expeditious manner. Assistant was necessary to provide appropriate retraction of vital neurovascular structures, to prevent femoral fracture, and to allow for anatomic placement of the prosthesis.

## 2015-04-03 NOTE — Anesthesia Procedure Notes (Signed)
Procedure Name: Intubation Performed by: Enrigue Catena E Pre-anesthesia Checklist: Patient identified, Emergency Drugs available, Suction available and Patient being monitored Patient Re-evaluated:Patient Re-evaluated prior to inductionOxygen Delivery Method: Circle System Utilized Preoxygenation: Pre-oxygenation with 100% oxygen Intubation Type: IV induction Ventilation: Mask ventilation without difficulty Laryngoscope Size: Mac and 4 Grade View: Grade II Tube type: Oral Tube size: 8.0 mm Number of attempts: 1 Airway Equipment and Method: Stylet and Oral airway Placement Confirmation: ETT inserted through vocal cords under direct vision,  positive ETCO2 and breath sounds checked- equal and bilateral Secured at: 21 cm Tube secured with: Tape Dental Injury: Teeth and Oropharynx as per pre-operative assessment

## 2015-04-03 NOTE — H&P (Signed)
TOTAL HIP ADMISSION H&P  Patient is admitted for left total hip arthroplasty.  Subjective:  Chief Complaint: left hip pain  HPI: Kenneth Singleton, 59 y.o. male, has a history of pain and functional disability in the left hip(s) due to arthritis and patient has failed non-surgical conservative treatments for greater than 12 weeks to include NSAID's and/or analgesics, flexibility and strengthening excercises, use of assistive devices, weight reduction as appropriate and activity modification. Onset of symptoms was gradual starting 8 years ago with rapidlly worsening course since that time.The patient noted no past surgery on the left hip(s). Patient currently rates pain in the left hip at 10 out of 10 with activity. Patient has night pain, worsening of pain with activity and weight bearing, pain that interfers with activities of daily living and pain with passive range of motion. Patient has evidence of subchondral cysts, subchondral sclerosis, periarticular osteophytes and joint space narrowing by imaging studies. This condition presents safety issues increasing the risk of falls.There is no current active infection.  Patient Active Problem List   Diagnosis Date Noted  . Back pain, chronic 09/26/2013  . COPD bronchitis 03/12/2013  . Nonspecific elevation of levels of transaminase or lactic acid dehydrogenase (LDH) 02/05/2013  . Chest tightness 02/05/2013  . Other abnormal glucose 08/21/2012  . Routine general medical examination at a health care facility 07/02/2011  . Eczema 07/02/2011  . [place] ALCOHOL DEPEND EPISODIC DRUNKENNESS 02/13/2010  . AVASCULAR NECROSIS, FEMORAL HEAD 02/13/2010  . HYPERLIPIDEMIA 01/31/2009  . HYPERTENSION 01/31/2009   Past Medical History  Diagnosis Date  . Hyperlipidemia   . Hypertension   . Elevated LFTs   . Alcohol abuse    Past Surgical History   Procedure Laterality Date  . Vasectomy      (Not in a hospital admission) Allergies  Allergen Reactions  . Atorvastatin     REACTION: muscle aches   History  Substance Use Topics  . Smoking status: Former Smoker -- 30 years    Quit date: 07/02/2003  . Smokeless tobacco: Never Used     Comment: Regular exercise- No  . Alcohol Use: 7.2 oz/week    12 Cans of beer per week   Family History  Problem Relation Age of Onset  . Alcohol abuse Other   . Arthritis Other   . Diabetes Other   . Hyperlipidemia Other   . Hypertension Other   . Cancer Neg Hx   . Early death Neg Hx   . Stroke Neg Hx   . Hypertension Father   . Heart disease Father   . Hypertension Brother   . Hypertension Sister   . Lung cancer Sister     Review of Systems  Unable to perform ROS Constitutional: Negative.  HENT: Positive for hearing loss and tinnitus. Negative for congestion, ear discharge, ear pain, nosebleeds and sore throat.  Eyes: Negative.  Respiratory: Negative. Negative for cough and stridor.  Cardiovascular: Negative. Negative for chest pain.  Gastrointestinal: Negative.  Genitourinary: Negative.  Musculoskeletal: Negative.   Left hip pain, back pain  Skin: Negative.  Neurological: Negative. Negative for headaches.  Endo/Heme/Allergies: Negative.  Psychiatric/Behavioral: Negative.    Objective:  Physical Exam  Vitals reviewed. Constitutional: He is oriented to person, place, and time. He appears well-developed and well-nourished.  HENT:  Head: Normocephalic and atraumatic.  Eyes: Pupils are equal, round, and reactive to light.  Neck: Normal range of motion. Neck supple.  Cardiovascular: Normal rate, regular rhythm, normal heart sounds and intact distal  pulses.  Respiratory: Effort normal and breath sounds normal.  GI: Soft. Bowel sounds are normal.  Genitourinary:  deferred  Musculoskeletal:   Left hip: He exhibits decreased range of motion and crepitus.  Neurological: He is alert and oriented to person, place, and time. He has normal reflexes.  Skin: Skin is warm and dry.  Psychiatric: He has a normal mood and affect. His behavior is normal. Judgment and thought content normal.    Vital signs in last 24 hours: @VSRANGES @  Labs:   Estimated body mass index is 21.33 kg/(m^2) as calculated from the following:  Height as of 06/26/14: 5' 7"  (1.702 m).  Weight as of 06/26/14: 61.78 kg (136 lb 3.2 oz).   Imaging Review Plain radiographs demonstrate severe degenerative joint disease of the left hip(s). The bone quality appears to be adequate for age and reported activity level.  Assessment/Plan:  End stage arthritis, left hip(s)  The patient history, physical examination, clinical judgement of the provider and imaging studies are consistent with end stage degenerative joint disease of the left hip(s) and total hip arthroplasty is deemed medically necessary. The treatment options including medical management, injection therapy, arthroscopy and arthroplasty were discussed at length. The risks and benefits of total hip arthroplasty were presented and reviewed. The risks due to aseptic loosening, infection, stiffness, dislocation/subluxation, thromboembolic complications and other imponderables were discussed. The patient acknowledged the explanation, agreed to proceed with the plan and consent was signed. Patient is being admitted for inpatient treatment for surgery, pain control, PT, OT, prophylactic antibiotics, VTE prophylaxis, progressive ambulation and ADL's and discharge planning.The patient is planning to be discharged home with home health services

## 2015-04-03 NOTE — Anesthesia Preprocedure Evaluation (Signed)
Anesthesia Evaluation  Patient identified by MRN, date of birth, ID band Patient awake    Reviewed: Allergy & Precautions, NPO status , Patient's Chart, lab work & pertinent test results  Airway Mallampati: II  TM Distance: >3 FB Neck ROM: Full    Dental no notable dental hx.    Pulmonary sleep apnea , COPD COPD inhaler, former smoker,  breath sounds clear to auscultation  Pulmonary exam normal       Cardiovascular hypertension, Pt. on medications Normal cardiovascular examRhythm:Regular Rate:Normal     Neuro/Psych PSYCHIATRIC DISORDERS negative neurological ROS     GI/Hepatic GERD-  ,Nonspecific Elevated LFTs  H/O alcohol use   Endo/Other  negative endocrine ROS  Renal/GU negative Renal ROS  negative genitourinary   Musculoskeletal  (+) Arthritis -,   Abdominal (+) + obese,   Peds negative pediatric ROS (+)  Hematology negative hematology ROS (+)   Anesthesia Other Findings   Reproductive/Obstetrics negative OB ROS                             Anesthesia Physical Anesthesia Plan  ASA: II  Anesthesia Plan: General   Post-op Pain Management:    Induction: Intravenous  Airway Management Planned: Oral ETT  Additional Equipment:   Intra-op Plan:   Post-operative Plan: Extubation in OR  Informed Consent: I have reviewed the patients History and Physical, chart, labs and discussed the procedure including the risks, benefits and alternatives for the proposed anesthesia with the patient or authorized representative who has indicated his/her understanding and acceptance.   Dental advisory given  Plan Discussed with: CRNA  Anesthesia Plan Comments:         Anesthesia Quick Evaluation

## 2015-04-03 NOTE — Anesthesia Postprocedure Evaluation (Signed)
  Anesthesia Post-op Note  Patient: Kenneth Divelbiss [facility] Sr.  Procedure(s) Performed: Procedure(s) (LRB): LEFT TOTAL HIP ARTHROPLASTY ANTERIOR APPROACH (Left)  Patient Location: PACU  Anesthesia Type: General  Level of Consciousness: awake and alert   Airway and Oxygen Therapy: Patient Spontanous Breathing  Post-op Pain: mild  Post-op Assessment: Post-op Vital signs reviewed, Patient's Cardiovascular Status Stable, Respiratory Function Stable, Patent Airway and No signs of Nausea or vomiting  Last Vitals:  Filed Vitals:   04/03/15 1942  BP: 141/78  Pulse: 97  Temp: 36.7 C  Resp: 14    Post-op Vital Signs: stable   Complications: No apparent anesthesia complications

## 2015-04-04 ENCOUNTER — Encounter (HOSPITAL_COMMUNITY): Payer: Self-pay | Admitting: Orthopedic Surgery

## 2015-04-04 LAB — BASIC METABOLIC PANEL
Anion gap: 9 (ref 5–15)
BUN: 14 mg/dL (ref 6–20)
CHLORIDE: 102 mmol/L (ref 101–111)
CO2: 23 mmol/L (ref 22–32)
Calcium: 8.1 mg/dL — ABNORMAL LOW (ref 8.9–10.3)
Creatinine, Ser: 1.08 mg/dL (ref 0.61–1.24)
GFR calc non Af Amer: >60 mL/min (ref >60)
GLUCOSE: 182 mg/dL — AB (ref 70–99)
Potassium: 4.2 mmol/L (ref 3.5–5.1)
Sodium: 134 mmol/L — ABNORMAL LOW (ref 135–145)

## 2015-04-04 LAB — CBC
HCT: 32.9 % — ABNORMAL LOW (ref 39.0–52.0)
Hemoglobin: 11.2 g/dL — ABNORMAL LOW (ref 13.0–17.0)
MCH: 32.7 pg (ref 26.0–34.0)
MCHC: 34 g/dL (ref 30.0–36.0)
MCV: 95.9 fL (ref 78.0–100.0)
PLATELETS: 229 10*3/uL (ref 150–400)
RBC: 3.43 MIL/uL — AB (ref 4.22–5.81)
RDW: 12.5 % (ref 11.5–15.5)
WBC: 11.4 10*3/uL — AB (ref 4.0–10.5)

## 2015-04-04 LAB — POCT I-STAT 4, (NA,K, GLUC, HGB,HCT)
GLUCOSE: 115 mg/dL — AB (ref 70–99)
HCT: 35 % — ABNORMAL LOW (ref 39.0–52.0)
HEMOGLOBIN: 11.9 g/dL — AB (ref 13.0–17.0)
POTASSIUM: 4.1 mmol/L (ref 3.5–5.1)
Sodium: 135 mmol/L (ref 135–145)

## 2015-04-04 MED ORDER — METHOCARBAMOL 500 MG PO TABS: 500.0000 mg | ORAL_TABLET | Freq: Four times a day (QID) | ORAL | Status: DC | PRN

## 2015-04-04 MED ORDER — ONDANSETRON HCL 4 MG PO TABS: 4.0000 mg | ORAL_TABLET | Freq: Four times a day (QID) | ORAL | Status: DC | PRN

## 2015-04-04 MED ORDER — ASPIRIN 325 MG PO TBEC: 325.0000 mg | DELAYED_RELEASE_TABLET | Freq: Two times a day (BID) | ORAL | Status: DC

## 2015-04-04 MED ORDER — HYDROCODONE-ACETAMINOPHEN 5-325 MG PO TABS: 1.0000 | ORAL_TABLET | ORAL | Status: DC | PRN

## 2015-04-04 NOTE — Evaluation (Signed)
Occupational Therapy Evaluation Patient Details Name: Kenneth Kari [facility] Sr. MRN: [id number] DOB: 02-09-1956 Today's Date: 04/04/2015    History of Present Illness L THR   Clinical Impression   Pt was admitted for the above surgery, (DA approach).  All education was completed.  No further OT is needed at this time.      Follow Up Recommendations  No OT follow up    Equipment Recommendations  3 in 1 bedside comode    Recommendations for Other Services       Precautions / Restrictions Precautions Precautions: Fall Restrictions Weight Bearing Restrictions: No Other Position/Activity Restrictions: WBAT      Mobility Bed Mobility            General bed mobility comments: pt oob  Transfers Overall transfer level: Needs assistance Equipment used: Rolling walker (2 wheeled) Transfers: Sit to/from Stand Sit to Stand: Min guard         General transfer comment: cues for LE management and use of UEs to self assist    Balance                                            ADL Overall ADL's : Needs assistance/impaired             Lower Body Bathing: Minimal assistance;Sit to/from stand       Lower Body Dressing: Minimal assistance;Sit to/from stand   Toilet Transfer: Ambulation;Comfort height toilet;Minimal assistance;Min guard (min A comfort height; min guard 3:1)       Tub/ Shower Transfer: Walk-in shower;Min guard;Ambulation     General ADL Comments: pt is able to perform UB adls with set up.  Educated on safety and working within pain tolerance.  Wife will assist as needed.  Shower stall may or may not accommodate 3:1 for shower:  there is a built in seat (small) that he probably can't access     Vision     Perception     Praxis      Pertinent Vitals/Pain Pain Assessment: 0-10 Pain Score: 4  Pain Location: L hip Pain Descriptors / Indicators: Aching Pain Intervention(s): Limited activity within patient's tolerance;Monitored  during session;Premedicated before session;Repositioned     Hand Dominance     Extremity/Trunk Assessment Upper Extremity Assessment Upper Extremity Assessment: Overall WFL for tasks assessed      Cervical / Trunk Assessment Cervical / Trunk Assessment: Normal   Communication Communication Communication: No difficulties   Cognition Arousal/Alertness: Awake/alert Behavior During Therapy: WFL for tasks assessed/performed Overall Cognitive Status: Within Functional Limits for tasks assessed                     General Comments       Exercises      Shoulder Instructions      Home Living Family/patient expects to be discharged to:: Private residence Living Arrangements: Spouse/significant other Available Help at Discharge: Family Type of Home: House Home Access: Stairs to enter Technical brewer of Steps: 3 Entrance Stairs-Rails: None Home Layout: One level     Bathroom Shower/Tub: Walk-in shower Shower/tub characteristics: Architectural technologist: Standard     Home Equipment: Cane - single point          Prior Functioning/Environment Level of Independence: Independent             OT Diagnosis: Generalized weakness;Acute pain   OT  Problem List:     OT Treatment/Interventions:      OT Goals(Current goals can be found in the care plan section) Acute Rehab OT Goals Patient Stated Goal: Resume previous lifestyle with decreased pain  OT Frequency:     Barriers to D/C:            Co-evaluation              End of Session    Activity Tolerance: Patient tolerated treatment well Patient left: in chair;with call bell/phone within reach;with family/visitor present   Time: 6203-5597 OT Time Calculation (min): 14 min Charges:  OT General Charges $OT Visit: 1 Procedure OT Evaluation $Initial OT Evaluation Tier I: 1 Procedure G-Codes:    Zahari Fazzino 04/24/2015, 9:58 AM Lesle Chris, OTR/L [phone] 24-Apr-2015

## 2015-04-04 NOTE — Progress Notes (Signed)
   Subjective:  Patient reports pain as mild.  Denies N/V/SOB/CP. Wants to go home.  Objective:   VITALS:   Filed Vitals:   04/03/15 2252 04/04/15 0200 04/04/15 0400 04/04/15 0600  BP: 113/60 112/60 127/62 117/58  Pulse: 114 111 109 104  Temp: 98.4 F (36.9 C) 99.9 F (37.7 C)  99 F (37.2 C)  TempSrc: Oral Oral  Oral  Resp: 20 20  20   Height:      Weight:      SpO2: 93% 92%  95%    ABD soft Sensation intact distally Intact pulses distally Dorsiflexion/Plantar flexion intact Incision: dressing C/D/I Compartment soft   Lab Results  Component Value Date   WBC 11.4* 04/04/2015   HGB 11.2* 04/04/2015   HCT 32.9* 04/04/2015   MCV 95.9 04/04/2015   PLT 229 04/04/2015   BMET    Component Value Date/Time   NA 134* 04/04/2015 0440   K 4.2 04/04/2015 0440   CL 102 04/04/2015 0440   CO2 23 04/04/2015 0440   GLUCOSE 182* 04/04/2015 0440   BUN 14 04/04/2015 0440   CREATININE 1.08 04/04/2015 0440   CALCIUM 8.1* 04/04/2015 0440   GFRNONAA >60 04/04/2015 0440   GFRAA >60 04/04/2015 0440     Assessment/Plan: 1 Day Post-Op   Active Problems:   Osteoarthritis of left hip   WBAT with walker DVT ppx: ASA 357m PO BID, SCDs, TEDs Advance diet Up with therapy D/C IV fluids Discharge home with home health DC home today after clears PT   Daryll Spisak, BHorald Pollen5/04/2015, 7:26 AM   BRod Can MD Cell [phone]

## 2015-04-04 NOTE — Progress Notes (Signed)
Physical Therapy Treatment Patient Details Name: Kenneth Singleton [name] MRN: [id number] DOB: 1956-01-02 Today's Date: 04/04/2015    History of Present Illness L THR    PT Comments    Pt progressing well and eager for dc.  Reviewed stairs and car transfers with pt and spouse.  Follow Up Recommendations  Home health PT     Equipment Recommendations  Rolling walker with 5" wheels    Recommendations for Other Services OT consult     Precautions / Restrictions Precautions Precautions: Fall Restrictions Weight Bearing Restrictions: No Other Position/Activity Restrictions: WBAT    Mobility  Bed Mobility Overal bed mobility: Needs Assistance Bed Mobility: Supine to Sit     Supine to sit: Min guard     General bed mobility comments: cues to self assist with R LE  Transfers Overall transfer level: Needs assistance Equipment used: Rolling walker (2 wheeled) Transfers: Sit to/from Stand Sit to Stand: Supervision         General transfer comment: cues for LE management and use of UEs to self assist  Ambulation/Gait Ambulation/Gait assistance: Min guard;Supervision Ambulation Distance (Feet): 180 Feet Assistive device: Rolling walker (2 wheeled) Gait Pattern/deviations: Step-to pattern;Step-through pattern;Decreased step length - right;Decreased step length - left;Shuffle;Trunk flexed     General Gait Details: cues for posture, ER on L, position from RW and initial sequence   Stairs Stairs: Yes Stairs assistance: Min assist Stair Management: No rails;Step to pattern;Backwards;With walker Number of Stairs: 4 General stair comments: 2 steps twice with wife assisting on 2nd attempt.  CUes for sequence and foot/RW placement  Wheelchair Mobility    Modified Rankin (Stroke Patients Only)       Balance                                    Cognition Arousal/Alertness: Awake/alert Behavior During Therapy: WFL for tasks assessed/performed Overall Cognitive  Status: Within Functional Limits for tasks assessed                      Exercises      General Comments        Pertinent Vitals/Pain Pain Assessment: 0-10 Pain Score: 3  Pain Location: L hip Pain Descriptors / Indicators: Aching;Sore Pain Intervention(s): Limited activity within patient's tolerance;Monitored during session;Premedicated before session    Home Living                      Prior Function            PT Goals (current goals can now be found in the care plan section) Acute Rehab PT Goals Patient Stated Goal: Resume previous lifestyle with decreased pain PT Goal Formulation: With patient Time For Goal Achievement: 04/11/15 Potential to Achieve Goals: Good Progress towards PT goals: Progressing toward goals    Frequency  7X/week    PT Plan Current plan remains appropriate    Co-evaluation             End of Session Equipment Utilized During Treatment: Gait belt Activity Tolerance: Patient tolerated treatment well Patient left: in bed;with call bell/phone within reach;with family/visitor present     Time: 1223-1246 PT Time Calculation (min) (ACUTE ONLY): 23 min  Charges:  $Gait Training: 8-22 mins $Therapeutic Activity: 8-22 mins                    G Codes:  Kenneth Singleton 04/04/2015, 1:33 PM

## 2015-04-04 NOTE — Discharge Summary (Signed)
Physician Discharge Summary  Patient ID: Kenneth Singleton [facility] Sr. MRN: [id number] DOB/AGE: 02/08/1956 59 y.o.  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 Hyperlipidemia   . Hypertension   . Elevated LFTs   . Alcohol abuse   . GERD (gastroesophageal reflux disease)   . Arthritis   . Colon polyps 05/03/2008    diminutive (hyperplastis)    Surgeries: Procedure(s): LEFT TOTAL HIP ARTHROPLASTY ANTERIOR APPROACH on 04/03/2015   Consultants (if any):    Discharged Condition: Improved  Hospital Course: ANTWOIN LACKEY Sr. is an 59 y.o. male who was admitted 04/03/2015 with a diagnosis of <principal problem not specified> and went to the operating room on 04/03/2015 and underwent the above named procedures.    He was given perioperative antibiotics:  Anti-infectives    Start     Dose/Rate Route Frequency Ordered Stop   04/03/15 2200  ceFAZolin (ANCEF) IVPB 2 g/50 mL premix     2 g 100 mL/hr over 30 Minutes Intravenous Every 6 hours 04/03/15 1948 04/04/15 0442   04/03/15 1157  ceFAZolin (ANCEF) IVPB 2 g/50 mL premix     2 g 100 mL/hr over 30 Minutes Intravenous On call to O.R. 04/03/15 1157 04/03/15 1504    .  He was given sequential compression devices, early ambulation, and ASA 318m PO BID for DVT prophylaxis.  He benefited maximally from the hospital stay and there were no complications.    Recent vital signs:  Filed Vitals:   04/04/15 0600  BP: 117/58  Pulse: 104  Temp: 99 F (37.2 C)  Resp: 20    Recent laboratory studies:  Lab Results  Component Value Date   HGB 11.2* 04/04/2015   HGB 15.0 03/26/2015   HGB 15.8 02/25/2015   Lab Results  Component Value Date   WBC 11.4* 04/04/2015   PLT 229 04/04/2015   Lab Results  Component Value Date   INR 0.94 03/26/2015   Lab Results  Component Value  Date   NA 134* 04/04/2015   K 4.2 04/04/2015   CL 102 04/04/2015   CO2 23 04/04/2015   BUN 14 04/04/2015   CREATININE 1.08 04/04/2015   GLUCOSE 182* 04/04/2015    Discharge Medications:     Medication List    STOP taking these medications        doxycycline 100 MG capsule  Commonly known as:  VIBRAMYCIN     traMADol 50 MG tablet  Commonly known as:  ULTRAM      TAKE these medications        aspirin 325 MG EC tablet  Take 1 tablet (325 mg total) by mouth 2 (two) times daily after a meal.     celecoxib 200 MG capsule  Commonly known as:  CELEBREX  Take 1 capsule (200 mg total) by mouth daily.     gabapentin 100 MG capsule  Commonly known as:  NEURONTIN  Take 1 capsule (100 mg total) by mouth 3 (three) times daily.     HYDROcodone-acetaminophen 5-325 MG per tablet  Commonly known as:  NORCO/VICODIN  Take 1-2 tablets by mouth every 4 (four) hours as needed (breakthrough pain).     losartan-hydrochlorothiazide 100-12.5 MG per tablet  Commonly known as:  HYZAAR  Take 1 tablet by mouth daily.     methocarbamol 500 MG tablet  Commonly known as:  ROBAXIN  Take 1 tablet (500 mg total) by mouth every 6 (six) hours as needed for muscle spasms.     omeprazole 20 MG capsule  Commonly known as:  PRILOSEC  Take 20 mg by mouth daily.     ondansetron 4 MG tablet  Commonly known as:  ZOFRAN  Take 1 tablet (4 mg total) by mouth every 6 (six) hours as needed for nausea.     pravastatin 80 MG tablet  Commonly known as:  PRAVACHOL  TAKE 1 TABLET (80 MG TOTAL) BY MOUTH EVERY EVENING.     pyridOXINE 100 MG tablet  Commonly known as:  VITAMIN B-6  Take 100 mg by mouth daily.     triamcinolone cream 0.5 %  Commonly known as:  KENALOG  Apply 1 application topically 3 (three) times daily.     TUDORZA PRESSAIR 400 MCG/ACT Aepb  Generic drug:  Aclidinium Bromide  INHALE 1 PUFF INTO THE LUNGS 2 TIMES DAILY     vitamin C 1000 MG tablet  Take 1,000 mg by mouth 2 (two) times  daily.        Diagnostic Studies: Dg Pelvis Portable  04/03/2015   CLINICAL DATA:  Postop left hip arthroplasty  EXAM: PORTABLE PELVIS 1-2 VIEWS  COMPARISON:  None.  FINDINGS: Left hip total arthroplasty. The femoral component and acetabular component appear well seated. No evidence of fracture.  IMPRESSION: No complication following left hip total arthroplasty.   Electronically Signed   By: Suzy Bouchard M.D.   On: 04/03/2015 19:11   Dg C-arm 61-120 Min-no Report  04/03/2015   CLINICAL DATA: surgery   C-ARM 61-120 MINUTES  Fluoroscopy was utilized by the requesting physician.  No radiographic  interpretation.    Dg Hip Unilat With Pelvis 1v Left  04/03/2015   CLINICAL DATA:  Left hip surgery  EXAM: LEFT HIP (WITH PELVIS) 1 VIEW  COMPARISON:  None.  FINDINGS: Intraoperative imaging demonstrates left total hip arthroplasty hardware in apparent good position. No fracture or other immediate complication is evident.  IMPRESSION: Left total hip arthroplasty   Electronically Signed   By: Andreas Newport M.D.   On: 04/03/2015 22:40    Disposition: 01-Home or Self Care      Discharge Instructions    Call MD / Call 911    Complete by:  As directed   If you experience chest pain or shortness of breath, CALL 911 and be transported to the hospital emergency room.  If you develope a fever above 101 F, pus (white drainage) or increased drainage or redness at the wound, or calf pain, call your surgeon's office.     Constipation Prevention    Complete by:  As directed   Drink plenty of fluids.  Prune juice may be helpful.  You may use a stool softener, such as Colace (over the counter) 100 mg twice a day.  Use MiraLax (over the counter) for constipation as needed.     Diet - low sodium heart healthy    Complete by:  As directed      Driving restrictions    Complete by:  As directed   No driving until cleared by MD     Increase activity slowly as tolerated    Complete by:  As directed      Lifting  restrictions    Complete by:  As directed   No lifting for 6 weeks     TED hose    Complete by:  As directed   Use stockings (TED hose) for 2 weeks on both leg(s).  You may remove them at night for sleeping.           Follow-up Information    Follow up with Alice Vitelli, Horald Pollen, MD. Schedule an appointment as soon as possible for a visit in 2 weeks.   Specialty:  Orthopedic Surgery   Why:  For wound re-check   Contact information:   [address] [address] [phone]        Signed: Elie Goody 04/04/2015, 7:34 AM

## 2015-04-04 NOTE — Evaluation (Signed)
Physical Therapy Evaluation Patient Details Name: Kenneth Singleton [facility] Sr. MRN: [id number] DOB: 1956/03/11 Today's Date: 04/04/2015   History of Present Illness  L THR  Clinical Impression  Pt s/p L THR presents with decreased L LE strength/ROM and post op pain limiting functional mobility.  Pt should progress well to dc home with family assist and HHPT follow up.    Follow Up Recommendations Home health PT    Equipment Recommendations  Rolling walker with 5" wheels    Recommendations for Other Services OT consult     Precautions / Restrictions Precautions Precautions: Fall Restrictions Weight Bearing Restrictions: No Other Position/Activity Restrictions: WBAT      Mobility  Bed Mobility Overal bed mobility: Needs Assistance Bed Mobility: Supine to Sit     Supine to sit: Min assist     General bed mobility comments: cues for sequence and min assist to manage L LE  Transfers Overall transfer level: Needs assistance Equipment used: Rolling walker (2 wheeled) Transfers: Sit to/from Stand Sit to Stand: Min assist         General transfer comment: cues for LE management and use of UEs to self assist  Ambulation/Gait Ambulation/Gait assistance: Min assist;Min guard Ambulation Distance (Feet): 280 Feet Assistive device: Rolling walker (2 wheeled) Gait Pattern/deviations: Step-to pattern;Step-through pattern;Decreased step length - right;Decreased step length - left;Shuffle;Trunk flexed     General Gait Details: cues for posture, position from RW and initial sequence  Stairs            Wheelchair Mobility    Modified Rankin (Stroke Patients Only)       Balance                                             Pertinent Vitals/Pain Pain Assessment: 0-10 Pain Score: 4  Pain Location: L hip Pain Descriptors / Indicators: Aching;Sore Pain Intervention(s): Limited activity within patient's tolerance;Monitored during session;Premedicated before  session    [place] expects to be discharged to:: Private residence Living Arrangements: Spouse/significant other Available Help at Discharge: Family Type of Home: House Home Access: Stairs to enter Entrance Stairs-Rails: None Technical brewer of Steps: 3 Home Layout: One level Home Equipment: Cane - single point      Prior Function Level of Independence: Independent               Hand Dominance        Extremity/Trunk Assessment   Upper Extremity Assessment: Overall WFL for tasks assessed           Lower Extremity Assessment: LLE deficits/detail   LLE Deficits / Details: hip strength 3-/5 with AAROM at hip to 90 flex and 20 abd  Cervical / Trunk Assessment: Normal  Communication   Communication: No difficulties  Cognition Arousal/Alertness: Awake/alert Behavior During Therapy: WFL for tasks assessed/performed Overall Cognitive Status: Within Functional Limits for tasks assessed                      General Comments      Exercises Total Joint Exercises Ankle Circles/Pumps: AROM;Both;15 reps;Supine Quad Sets: AROM;Both;10 reps;Supine Heel Slides: AAROM;Left;15 reps;Supine Hip ABduction/ADduction: AAROM;Left;15 reps;Supine      Assessment/Plan    PT Assessment Patient needs continued PT services  PT Diagnosis Difficulty walking   PT Problem List Decreased strength;Decreased range of motion;Decreased activity tolerance;Decreased mobility;Decreased knowledge of use of  DME;Pain  PT Treatment Interventions DME instruction;Gait training;Stair training;Functional mobility training;Therapeutic activities;Therapeutic exercise;Patient/family education   PT Goals (Current goals can be found in the Care Plan section) Acute Rehab PT Goals Patient Stated Goal: Resume previous lifestyle with decreased pain PT Goal Formulation: With patient Time For Goal Achievement: 04/11/15 Potential to Achieve Goals: Good    Frequency  7X/week   Barriers to discharge        Co-evaluation               End of Session Equipment Utilized During Treatment: Gait belt Activity Tolerance: Patient tolerated treatment well Patient left: in chair;with call bell/phone within reach;with family/visitor present Nurse Communication: Mobility status         Time: 0812-0840 PT Time Calculation (min) (ACUTE ONLY): 28 min   Charges:   PT Evaluation $Initial PT Evaluation Tier I: 1 Procedure PT Treatments $Therapeutic Exercise: 8-22 mins   PT G Codes:        Kenneth Singleton 2015/04/08, 9:27 AM

## 2015-04-06 ENCOUNTER — Other Ambulatory Visit: Payer: Self-pay | Admitting: Orthopedic Surgery

## 2015-04-06 MED ORDER — OXYCODONE HCL 5 MG PO TABS: 5.0000 mg | ORAL_TABLET | ORAL | Status: DC | PRN

## 2015-06-11 ENCOUNTER — Other Ambulatory Visit: Payer: Self-pay | Admitting: Internal Medicine

## 2015-06-11 ENCOUNTER — Telehealth: Payer: Self-pay | Admitting: Internal Medicine

## 2015-06-11 ENCOUNTER — Other Ambulatory Visit: Payer: Self-pay

## 2015-06-11 DIAGNOSIS — M1612 Unilateral primary osteoarthritis, left hip: Secondary | ICD-10-CM

## 2015-06-11 MED ORDER — ACLIDINIUM BROMIDE 400 MCG/ACT IN AEPB: INHALATION_SPRAY | RESPIRATORY_TRACT | Status: DC

## 2015-06-11 MED ORDER — TRAMADOL HCL 50 MG PO TABS: 50.0000 mg | ORAL_TABLET | Freq: Two times a day (BID) | ORAL | Status: DC | PRN

## 2015-06-11 MED ORDER — TRAMADOL HCL 50 MG PO TABS: 50.0000 mg | ORAL_TABLET | Freq: Three times a day (TID) | ORAL | Status: AC | PRN

## 2015-06-11 NOTE — Telephone Encounter (Signed)
Requesting refill on tramadol and tudorza

## 2015-06-12 ENCOUNTER — Other Ambulatory Visit: Payer: Self-pay | Admitting: Internal Medicine

## 2015-07-11 ENCOUNTER — Other Ambulatory Visit: Payer: Self-pay

## 2015-07-11 MED ORDER — PRAVASTATIN SODIUM 80 MG PO TABS: ORAL_TABLET | ORAL | Status: DC

## 2015-08-15 ENCOUNTER — Other Ambulatory Visit: Payer: Self-pay

## 2015-08-15 DIAGNOSIS — G8929 Other chronic pain: Secondary | ICD-10-CM

## 2015-08-15 DIAGNOSIS — M549 Dorsalgia, unspecified: Principal | ICD-10-CM

## 2015-08-15 MED ORDER — GABAPENTIN 100 MG PO CAPS: 100.0000 mg | ORAL_CAPSULE | Freq: Three times a day (TID) | ORAL | Status: DC

## 2015-08-15 NOTE — Telephone Encounter (Signed)
Ok to rf? 

## 2015-09-08 ENCOUNTER — Other Ambulatory Visit: Payer: Self-pay | Admitting: *Deleted

## 2015-09-08 DIAGNOSIS — I10 Essential (primary) hypertension: Secondary | ICD-10-CM

## 2015-09-08 MED ORDER — LOSARTAN POTASSIUM-HCTZ 100-12.5 MG PO TABS: 1.0000 | ORAL_TABLET | Freq: Every day | ORAL | Status: DC

## 2015-09-08 NOTE — Telephone Encounter (Signed)
Left msg on triage pt requesting refill on his Losartan/HCTZ 90 day. Sent electronically...Kenneth Singleton

## 2015-10-21 IMAGING — CR DG PORTABLE PELVIS
1 series · 2 of 2 positions shown · non-contrast
Comparison: None.

CLINICAL DATA: Postop left hip arthroplasty

EXAM:
PORTABLE PELVIS 1-2 VIEWS

[Series 1: AP · U · 2 of 2 slices shown]
[im 1/2]
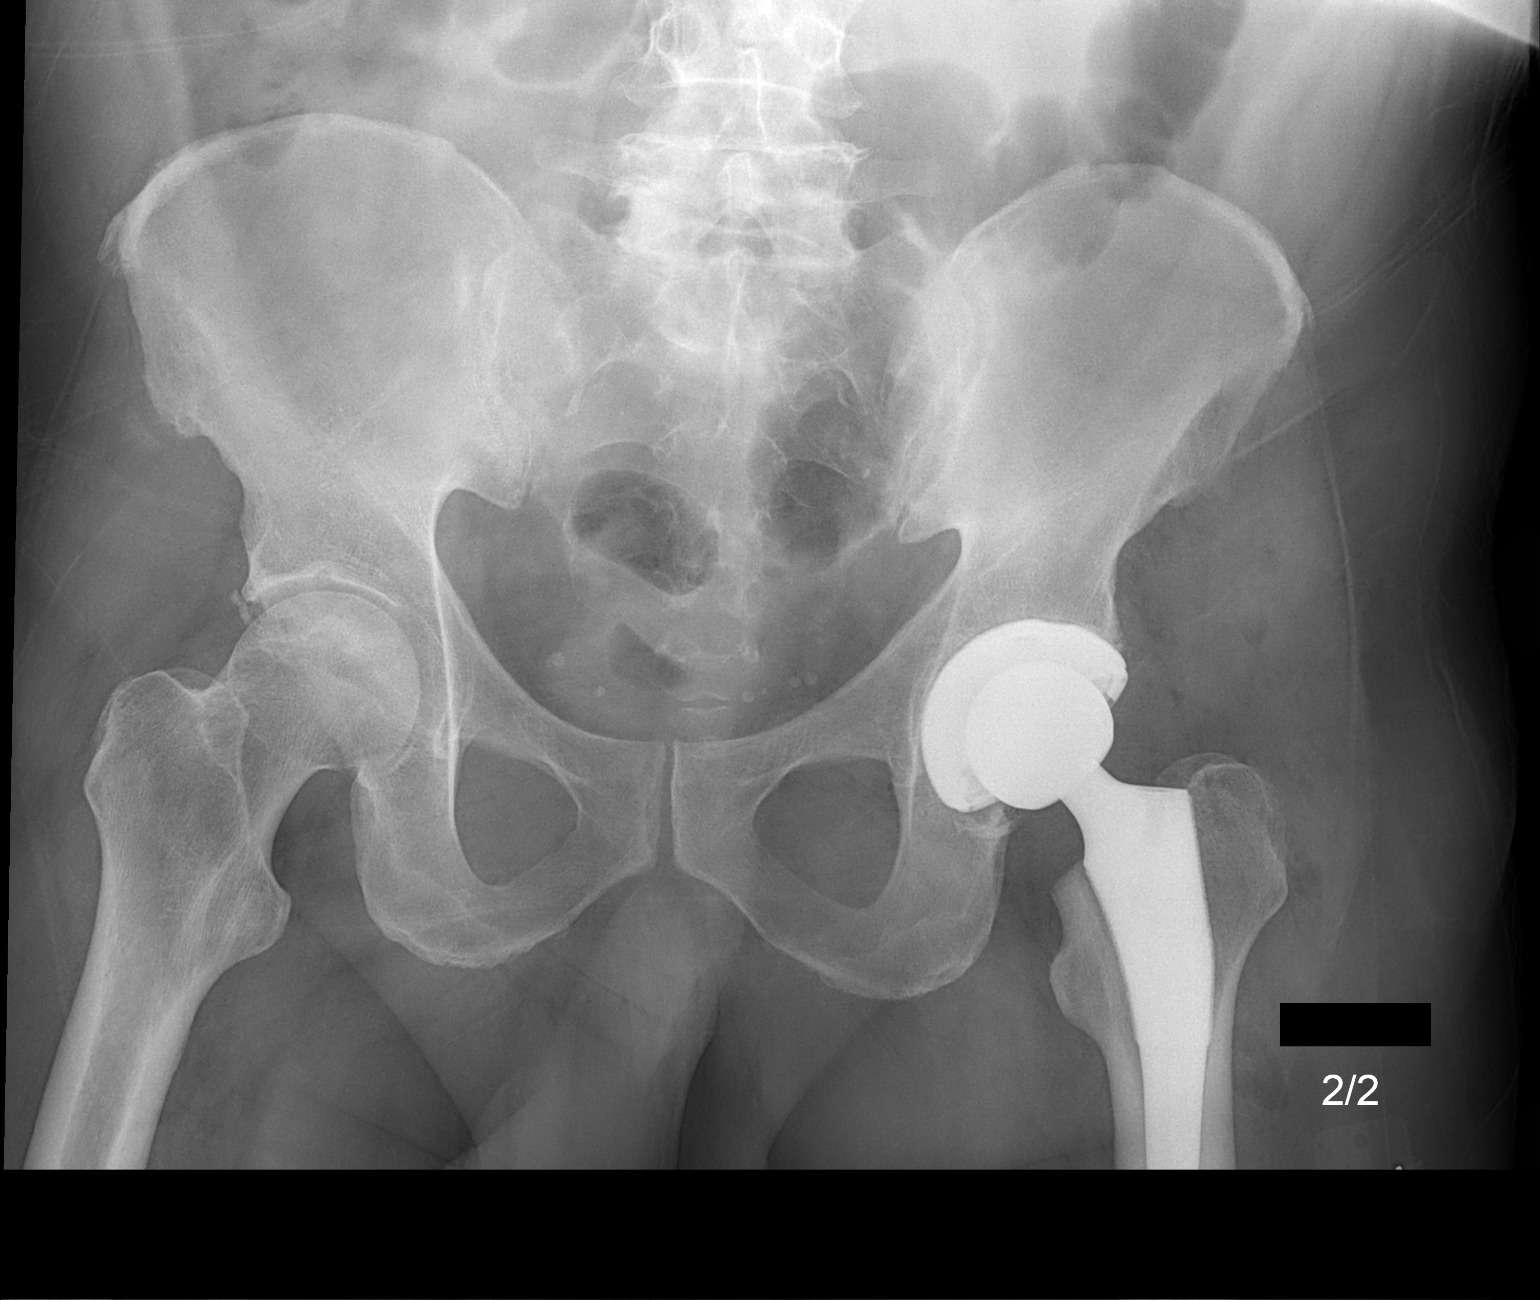
[im 2/2]
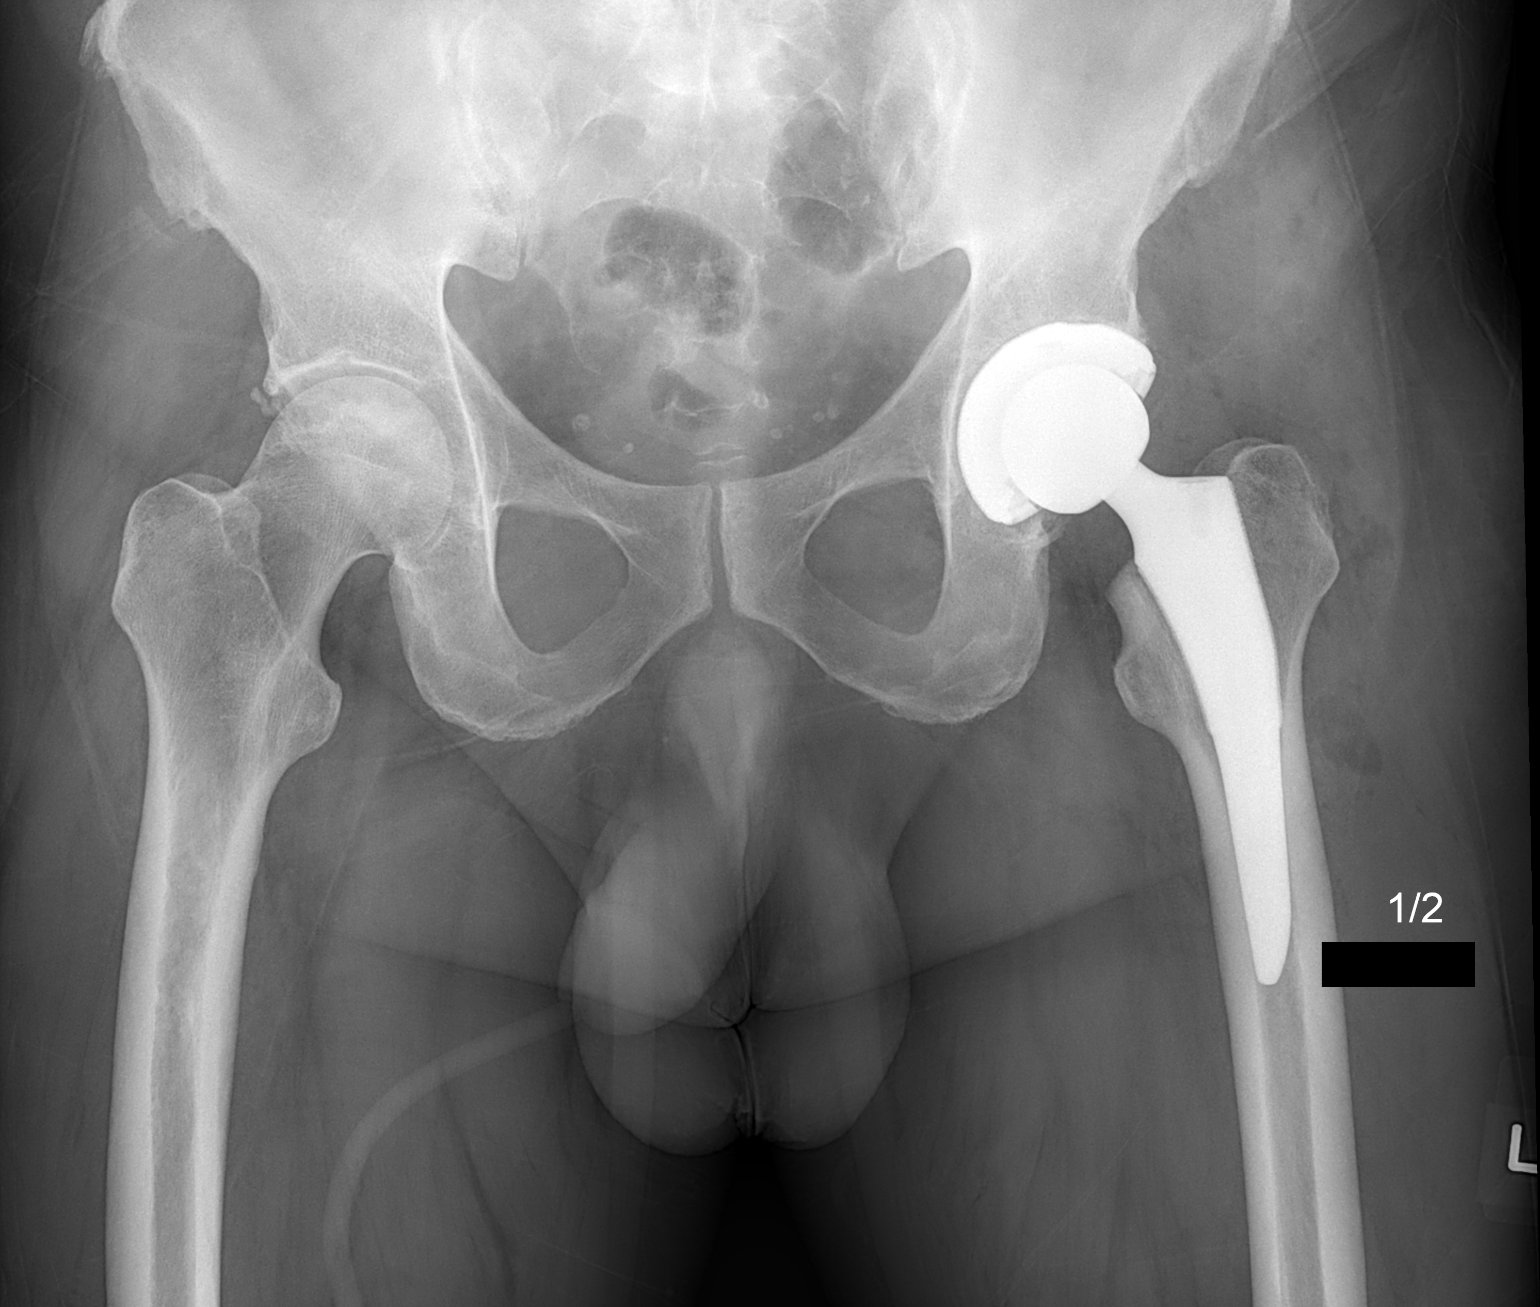

[2 of 2 positions shown; findings below may reference images not displayed]

FINDINGS: Left hip total arthroplasty. The femoral component and acetabular
component appear well seated. No evidence of fracture.
IMPRESSION: No complication following left hip total arthroplasty.

## 2015-11-03 ENCOUNTER — Other Ambulatory Visit: Payer: Self-pay

## 2015-11-05 ENCOUNTER — Telehealth: Payer: Self-pay

## 2015-11-05 DIAGNOSIS — M87059 Idiopathic aseptic necrosis of unspecified femur: Secondary | ICD-10-CM

## 2015-11-05 DIAGNOSIS — M549 Dorsalgia, unspecified: Principal | ICD-10-CM

## 2015-11-05 DIAGNOSIS — G8929 Other chronic pain: Secondary | ICD-10-CM

## 2015-11-05 NOTE — Telephone Encounter (Signed)
Patients pharmacy sent is RX refill for tramadol HCL 50 MG tablet. Patient has not been on medication since May. Please advise.

## 2015-11-08 MED ORDER — TRAMADOL HCL 50 MG PO TABS: 50.0000 mg | ORAL_TABLET | Freq: Three times a day (TID) | ORAL | Status: DC | PRN

## 2015-11-08 NOTE — Addendum Note (Signed)
Addended by: Janith Lima on: 11/08/2015 12:03 PM   Modules accepted: Orders

## 2015-11-08 NOTE — Telephone Encounter (Signed)
Rx written.

## 2015-11-09 ENCOUNTER — Other Ambulatory Visit: Payer: Self-pay | Admitting: Internal Medicine

## 2015-11-10 NOTE — Telephone Encounter (Signed)
faxed

## 2015-11-21 ENCOUNTER — Other Ambulatory Visit: Payer: Self-pay | Admitting: Internal Medicine

## 2015-11-21 NOTE — Telephone Encounter (Signed)
He needs an appt

## 2015-12-18 ENCOUNTER — Other Ambulatory Visit: Payer: Self-pay | Admitting: Internal Medicine

## 2016-01-02 ENCOUNTER — Other Ambulatory Visit: Payer: Self-pay | Admitting: Internal Medicine

## 2016-02-01 ENCOUNTER — Other Ambulatory Visit: Payer: Self-pay | Admitting: Internal Medicine

## 2016-03-04 ENCOUNTER — Other Ambulatory Visit: Payer: Self-pay | Admitting: Internal Medicine

## 2016-03-04 DIAGNOSIS — M549 Dorsalgia, unspecified: Principal | ICD-10-CM

## 2016-03-04 DIAGNOSIS — G8929 Other chronic pain: Secondary | ICD-10-CM

## 2016-03-05 NOTE — Telephone Encounter (Signed)
Patient has made appt for the 17th.  Patient would like to know if enough medication can be sent to pharmacy to get him through to that time.

## 2016-03-05 NOTE — Telephone Encounter (Signed)
Pt made aware twice before any fills can be given. Please get pt on schedule for physical. Last seen 03/06/15 labs are due.

## 2016-03-06 NOTE — Telephone Encounter (Signed)
Please advise 

## 2016-03-15 ENCOUNTER — Encounter: Payer: Self-pay | Admitting: Internal Medicine

## 2016-03-15 ENCOUNTER — Other Ambulatory Visit (INDEPENDENT_AMBULATORY_CARE_PROVIDER_SITE_OTHER): Payer: BLUE CROSS/BLUE SHIELD

## 2016-03-15 ENCOUNTER — Ambulatory Visit (INDEPENDENT_AMBULATORY_CARE_PROVIDER_SITE_OTHER): Payer: BLUE CROSS/BLUE SHIELD | Admitting: Internal Medicine

## 2016-03-15 VITALS — BP 110/80 | HR 90 | Temp 98.6°F | Resp 16 | Ht 66.0 in | Wt 201.0 lb

## 2016-03-15 DIAGNOSIS — D539 Nutritional anemia, unspecified: Secondary | ICD-10-CM

## 2016-03-15 DIAGNOSIS — E781 Pure hyperglyceridemia: Secondary | ICD-10-CM

## 2016-03-15 DIAGNOSIS — R740 Nonspecific elevation of levels of transaminase and lactic acid dehydrogenase [LDH]: Secondary | ICD-10-CM

## 2016-03-15 DIAGNOSIS — R7401 Elevation of levels of liver transaminase levels: Secondary | ICD-10-CM

## 2016-03-15 DIAGNOSIS — I10 Essential (primary) hypertension: Secondary | ICD-10-CM

## 2016-03-15 DIAGNOSIS — R7309 Other abnormal glucose: Secondary | ICD-10-CM

## 2016-03-15 DIAGNOSIS — Z0000 Encounter for general adult medical examination without abnormal findings: Secondary | ICD-10-CM

## 2016-03-15 DIAGNOSIS — G8929 Other chronic pain: Secondary | ICD-10-CM

## 2016-03-15 DIAGNOSIS — M150 Primary generalized (osteo)arthritis: Secondary | ICD-10-CM

## 2016-03-15 DIAGNOSIS — R7402 Elevation of levels of lactic acid dehydrogenase (LDH): Secondary | ICD-10-CM

## 2016-03-15 DIAGNOSIS — R7989 Other specified abnormal findings of blood chemistry: Secondary | ICD-10-CM

## 2016-03-15 DIAGNOSIS — M159 Polyosteoarthritis, unspecified: Secondary | ICD-10-CM

## 2016-03-15 DIAGNOSIS — Z23 Encounter for immunization: Secondary | ICD-10-CM | POA: Diagnosis not present

## 2016-03-15 DIAGNOSIS — M549 Dorsalgia, unspecified: Secondary | ICD-10-CM

## 2016-03-15 LAB — IBC PANEL
Iron: 83 ug/dL (ref 42–165)
Saturation Ratios: 21.7 % (ref 20.0–50.0)
Transferrin: 273 mg/dL (ref 212.0–360.0)

## 2016-03-15 LAB — COMPREHENSIVE METABOLIC PANEL
ALK PHOS: 67 U/L (ref 39–117)
ALT: 32 U/L (ref 0–53)
AST: 30 U/L (ref 0–37)
Albumin: 4.5 g/dL (ref 3.5–5.2)
BUN: 13 mg/dL (ref 6–23)
CHLORIDE: 100 meq/L (ref 96–112)
CO2: 28 meq/L (ref 19–32)
Calcium: 9.8 mg/dL (ref 8.4–10.5)
Creatinine, Ser: 0.99 mg/dL (ref 0.40–1.50)
GFR: 82.21 mL/min (ref >60.00)
GLUCOSE: 99 mg/dL (ref 70–99)
POTASSIUM: 4.1 meq/L (ref 3.5–5.1)
SODIUM: 137 meq/L (ref 135–145)
TOTAL PROTEIN: 7.5 g/dL (ref 6.0–8.3)
Total Bilirubin: 0.7 mg/dL (ref 0.2–1.2)

## 2016-03-15 LAB — CBC WITH DIFFERENTIAL/PLATELET
BASOS PCT: 0.3 % (ref 0.0–3.0)
Basophils Absolute: 0 10*3/uL (ref 0.0–0.1)
EOS PCT: 2.1 % (ref 0.0–5.0)
Eosinophils Absolute: 0.2 10*3/uL (ref 0.0–0.7)
HCT: 45.1 % (ref 39.0–52.0)
Hemoglobin: 15.6 g/dL (ref 13.0–17.0)
LYMPHS ABS: 1.1 10*3/uL (ref 0.7–4.0)
Lymphocytes Relative: 13.7 % (ref 12.0–46.0)
MCHC: 34.7 g/dL (ref 30.0–36.0)
MCV: 94.6 fl (ref 78.0–100.0)
MONO ABS: 0.6 10*3/uL (ref 0.1–1.0)
MONOS PCT: 7 % (ref 3.0–12.0)
NEUTROS ABS: 6.1 10*3/uL (ref 1.4–7.7)
NEUTROS PCT: 76.9 % (ref 43.0–77.0)
Platelets: 253 10*3/uL (ref 150.0–400.0)
RBC: 4.77 Mil/uL (ref 4.22–5.81)
RDW: 12.6 % (ref 11.5–15.5)
WBC: 8 10*3/uL (ref 4.0–10.5)

## 2016-03-15 LAB — FERRITIN: FERRITIN: 366.9 ng/mL — AB (ref 22.0–322.0)

## 2016-03-15 LAB — LIPID PANEL
CHOLESTEROL: 219 mg/dL — AB (ref 0–200)
HDL: 59 mg/dL (ref >39.00)
NonHDL: 159.52
Total CHOL/HDL Ratio: 4
Triglycerides: 387 mg/dL — ABNORMAL HIGH (ref 0.0–149.0)
VLDL: 77.4 mg/dL — AB (ref 0.0–40.0)

## 2016-03-15 LAB — TSH: TSH: 2.28 u[IU]/mL (ref 0.35–4.50)

## 2016-03-15 LAB — HEPATITIS A ANTIBODY, TOTAL: HEP A TOTAL AB: NONREACTIVE

## 2016-03-15 LAB — HEPATITIS B CORE ANTIBODY, TOTAL: Hep B Core Total Ab: NONREACTIVE

## 2016-03-15 LAB — HEMOGLOBIN A1C: HEMOGLOBIN A1C: 5.2 % (ref 4.6–6.5)

## 2016-03-15 LAB — LDL CHOLESTEROL, DIRECT: LDL DIRECT: 107 mg/dL

## 2016-03-15 LAB — VITAMIN B12: VITAMIN B 12: 542 pg/mL (ref 211–911)

## 2016-03-15 LAB — HEPATITIS B SURFACE ANTIBODY,QUALITATIVE: Hep B S Ab: NEGATIVE

## 2016-03-15 LAB — FOLATE: Folate: >23.7 ng/mL (ref >5.9)

## 2016-03-15 MED ORDER — LOSARTAN POTASSIUM-HCTZ 100-12.5 MG PO TABS: 1.0000 | ORAL_TABLET | Freq: Every day | ORAL | Status: DC

## 2016-03-15 MED ORDER — CELECOXIB 200 MG PO CAPS: 200.0000 mg | ORAL_CAPSULE | Freq: Every day | ORAL | Status: DC

## 2016-03-15 NOTE — Progress Notes (Signed)
Subjective:  Patient ID: Kenneth Offer Sr., male    DOB: May 30, 1956  Age: 60 y.o. MRN: [id number]  CC: Hyperlipidemia; Hypertension; Anemia; Annual Exam; and Osteoarthritis   HPI Kenneth Tebbetts [facility] Sr. presents for a CPX - his BP has been well controlled on an ARB/HCTZ combination, he denies Headache, blurred vision, chest pain, shortness of breath, dyspnea on exertion, palpitations, or fatigue.  He has chronic,unchanged back and joint pain and takes Celebrex and tramadol for symptom relief.   Outpatient Prescriptions Prior to Visit  Medication Sig Dispense Refill  . Aclidinium Bromide (TUDORZA PRESSAIR) 400 MCG/ACT AEPB INHALE 1 PUFF INTO THE LUNGS 2 TIMES DAILY 1 each 11  . aspirin EC 325 MG EC tablet Take 1 tablet (325 mg total) by mouth 2 (two) times daily after a meal. 60 tablet 0  . gabapentin (NEURONTIN) 100 MG capsule TAKE ONE CAPSULE BY MOUTH 3 TIMES A DAY 90 capsule 3  . omeprazole (PRILOSEC) 20 MG capsule Take 20 mg by mouth daily.    . pravastatin (PRAVACHOL) 80 MG tablet TAKE 1 TABLET (80 MG TOTAL) BY MOUTH EVERY EVENING. 90 tablet 2  . traMADol (ULTRAM) 50 MG tablet Take 1 tablet (50 mg total) by mouth every 8 (eight) hours as needed. 90 tablet 3  . celecoxib (CELEBREX) 200 MG capsule Take 1 capsule (200 mg total) by mouth daily. 90 capsule 3  . losartan-hydrochlorothiazide (HYZAAR) 100-12.5 MG tablet Take 1 tablet by mouth daily. Yearly physical w/labs due in April must see md for refills 30 tablet 0  . Ascorbic Acid (VITAMIN C) 1000 MG tablet Take 1,000 mg by mouth 2 (two) times daily. Reported on 03/15/2016    . pyridOXINE (VITAMIN B-6) 100 MG tablet Take 100 mg by mouth daily. Reported on 03/15/2016    . triamcinolone cream (KENALOG) 0.5 % Apply 1 application topically 3 (three) times daily. (Patient not taking: Reported on 03/15/2016) 30 g 1   No facility-administered medications prior to visit.    ROS Review of Systems  Constitutional: Negative.  Negative for fever, chills,  diaphoresis, appetite change and fatigue.  HENT: Negative.  Negative for sore throat and trouble swallowing.   Eyes: Negative.  Negative for photophobia and visual disturbance.  Respiratory: Negative.  Negative for cough, choking, chest tightness, shortness of breath and stridor.   Cardiovascular: Negative.  Negative for chest pain, palpitations and leg swelling.  Gastrointestinal: Negative.  Negative for nausea, vomiting, abdominal pain, diarrhea, constipation and blood in stool.  Endocrine: Negative.   Genitourinary: Negative.  Negative for dysuria, urgency, frequency, hematuria, decreased urine volume and difficulty urinating.  Musculoskeletal: Positive for back pain and arthralgias. Negative for myalgias, joint swelling and neck pain.  Skin: Negative.   Allergic/Immunologic: Negative.   Neurological: Negative.   Hematological: Negative.  Negative for adenopathy. Does not bruise/bleed easily.  Psychiatric/Behavioral: Negative.     Objective:  BP 110/80 mmHg  Pulse 90  Temp(Src) 98.6 F (37 C) (Oral)  Resp 16  Ht 5' 6"  (1.676 m)  Wt 201 lb (91.173 kg)  BMI 32.46 kg/m2  SpO2 96%  BP Readings from Last 3 Encounters:  03/15/16 110/80  04/04/15 122/69  03/26/15 137/80    Wt Readings from Last 3 Encounters:  03/15/16 201 lb (91.173 kg)  04/03/15 206 lb (93.441 kg)  03/26/15 206 lb (93.441 kg)    Physical Exam  Constitutional: He is oriented to person, place, and time. He appears well-developed and well-nourished. No distress.  HENT:  Head:  Normocephalic and atraumatic.  Mouth/Throat: Oropharynx is clear and moist. No oropharyngeal exudate.  Eyes: Conjunctivae are normal. Right eye exhibits no discharge. Left eye exhibits no discharge. No scleral icterus.  Neck: Normal range of motion. Neck supple. No JVD present. No tracheal deviation present. No thyromegaly present.  Cardiovascular: Normal rate, regular rhythm, normal heart sounds and intact distal pulses.  Exam reveals no  gallop and no friction rub.   No murmur heard. Pulses:      Carotid pulses are 1+ on the right side, and 1+ on the left side.      Radial pulses are 1+ on the right side, and 1+ on the left side.       Femoral pulses are 1+ on the right side, and 1+ on the left side.      Popliteal pulses are 1+ on the right side, and 1+ on the left side.       Dorsalis pedis pulses are 1+ on the right side, and 1+ on the left side.       Posterior tibial pulses are 1+ on the right side, and 1+ on the left side.  EKG ---  Sinus  Rhythm  WITHIN NORMAL LIMITS   Pulmonary/Chest: Effort normal and breath sounds normal. No stridor. No respiratory distress. He has no wheezes. He has no rales. He exhibits no tenderness.  Abdominal: Soft. Bowel sounds are normal. He exhibits no distension and no mass. There is no tenderness. There is no rebound and no guarding. Hernia confirmed negative in the right inguinal area and confirmed negative in the left inguinal area.  Genitourinary: Testes normal and penis normal. Rectal exam shows internal hemorrhoid. Rectal exam shows no external hemorrhoid, no fissure, no mass and no tenderness. Guaiac negative stool. Prostate is enlarged (1+ smooth symm BPH). Prostate is not tender. Right testis shows no mass, no swelling and no tenderness. Right testis is descended. Left testis shows no mass, no swelling and no tenderness. Left testis is descended. Circumcised. No penile erythema or penile tenderness. No discharge found.  Musculoskeletal: Normal range of motion. He exhibits no edema or tenderness.  Lymphadenopathy:    He has no cervical adenopathy.       Right: No inguinal adenopathy present.       Left: No inguinal adenopathy present.  Neurological: He is oriented to person, place, and time.  Skin: Skin is warm and dry. No rash noted. He is not diaphoretic. No erythema. No pallor.  Vitals reviewed.   Lab Results  Component Value Date   WBC 8.0 03/15/2016   HGB 15.6 03/15/2016     HCT 45.1 03/15/2016   PLT 253.0 03/15/2016   GLUCOSE 99 03/15/2016   CHOL 219* 03/15/2016   TRIG 387.0* 03/15/2016   HDL 59.00 03/15/2016   LDLDIRECT 107.0 03/15/2016   LDLCALC 99 02/13/2010   ALT 32 03/15/2016   AST 30 03/15/2016   NA 137 03/15/2016   K 4.1 03/15/2016   CL 100 03/15/2016   CREATININE 0.99 03/15/2016   BUN 13 03/15/2016   CO2 28 03/15/2016   TSH 2.28 03/15/2016   PSA 0.33 09/26/2013   INR 0.94 03/26/2015   HGBA1C 5.2 03/15/2016    No results found.  Assessment & Plan:   Kenneth Singleton was seen today for hyperlipidemia, hypertension, anemia, annual exam and osteoarthritis.  Diagnoses and all orders for this visit:  Essential hypertension - his EKG is negative for left ventricular hypertrophy, his blood pressure is well-controlled, his labs do not  reveal any secondary causes for hypertension or end organ damage. -     TSH; Future -     Comprehensive metabolic panel; Future -     losartan-hydrochlorothiazide (HYZAAR) 100-12.5 MG tablet; Take 1 tablet by mouth daily. Yearly physical w/labs due in April must see md for refills -     EKG 12-Lead  Other abnormal glucose- improvement noted -     Hemoglobin A1c; Future -     Comprehensive metabolic panel; Future  Deficiency anemia- his anemia has resolved and his vitamin levels are normal -     CBC with Differential/Platelet; Future -     IBC panel; Future -     Vitamin B12; Future -     Ferritin; Future -     Folate; Future  Nonspecific elevation of levels of transaminase or lactic acid dehydrogenase (LDH)- his liver enzymes are normal, he has no antibodies for hepatitis A and B so I've asked him to come in soon to start the vaccine process for hepatitis A and B. -     Comprehensive metabolic panel; Future -     Hepatitis A antibody, total; Future -     Hepatitis B core antibody, total; Future -     Hepatitis B surface antibody; Future  Hypertriglyceridemia, essential- improvement noted, he will continue to work  on his lifestyle modifications, at this time this does not need to be treated with a supplement. -     Lipid panel; Future  Back pain, chronic -     celecoxib (CELEBREX) 200 MG capsule; Take 1 capsule (200 mg total) by mouth daily.  Routine general medical examination at a health care facility- exam done, labs ordered and reviewed, vaccines reviewed and updated, his colonoscopy is up-to-date, patient education material was given.  Primary osteoarthritis involving multiple joints -     celecoxib (CELEBREX) 200 MG capsule; Take 1 capsule (200 mg total) by mouth daily.  Need for 23-polyvalent pneumococcal polysaccharide vaccine -     Pneumococcal polysaccharide vaccine 23-valent greater than or equal to 2yo subcutaneous/IM   I have discontinued Kenneth Singleton triamcinolone cream. I am also having him maintain his vitamin C, pyridOXINE, omeprazole, aspirin, Aclidinium Bromide, pravastatin, traMADol, gabapentin, losartan-hydrochlorothiazide, and celecoxib.  Meds ordered this encounter  Medications  . losartan-hydrochlorothiazide (HYZAAR) 100-12.5 MG tablet    Sig: Take 1 tablet by mouth daily. Yearly physical w/labs due in April must see md for refills    Dispense:  90 tablet    Refill:  1  . celecoxib (CELEBREX) 200 MG capsule    Sig: Take 1 capsule (200 mg total) by mouth daily.    Dispense:  90 capsule    Refill:  3     Follow-up: Return in about 6 months (around 09/14/2016).  Scarlette Calico, MD

## 2016-03-15 NOTE — Telephone Encounter (Signed)
Pt appt today.

## 2016-03-15 NOTE — Patient Instructions (Signed)

## 2016-03-16 ENCOUNTER — Encounter: Payer: Self-pay | Admitting: Internal Medicine

## 2016-04-01 ENCOUNTER — Other Ambulatory Visit: Payer: Self-pay | Admitting: Internal Medicine

## 2016-06-08 ENCOUNTER — Other Ambulatory Visit: Payer: Self-pay | Admitting: Internal Medicine

## 2016-06-09 NOTE — Telephone Encounter (Signed)
Script faxed to [facility].../lmb

## 2016-06-09 NOTE — Telephone Encounter (Signed)
MD is out of the office pls advise...Johny Chess

## 2016-07-01 ENCOUNTER — Other Ambulatory Visit: Payer: Self-pay | Admitting: Internal Medicine

## 2016-07-08 ENCOUNTER — Other Ambulatory Visit: Payer: Self-pay | Admitting: Internal Medicine

## 2016-09-02 ENCOUNTER — Other Ambulatory Visit: Payer: Self-pay | Admitting: Internal Medicine

## 2016-09-02 DIAGNOSIS — I10 Essential (primary) hypertension: Secondary | ICD-10-CM

## 2016-09-29 ENCOUNTER — Other Ambulatory Visit: Payer: Self-pay | Admitting: Internal Medicine

## 2016-11-03 ENCOUNTER — Other Ambulatory Visit: Payer: Self-pay | Admitting: Internal Medicine

## 2016-11-17 ENCOUNTER — Other Ambulatory Visit: Payer: Self-pay | Admitting: Internal Medicine

## 2016-11-17 DIAGNOSIS — I10 Essential (primary) hypertension: Secondary | ICD-10-CM

## 2016-11-17 NOTE — Telephone Encounter (Signed)
Pt needs an appointment

## 2016-12-01 ENCOUNTER — Other Ambulatory Visit: Payer: Self-pay | Admitting: Internal Medicine

## 2016-12-23 ENCOUNTER — Other Ambulatory Visit: Payer: Self-pay | Admitting: Internal Medicine

## 2016-12-23 DIAGNOSIS — I10 Essential (primary) hypertension: Secondary | ICD-10-CM

## 2017-01-21 ENCOUNTER — Other Ambulatory Visit: Payer: Self-pay | Admitting: Internal Medicine

## 2017-01-21 ENCOUNTER — Telehealth: Payer: Self-pay | Admitting: Internal Medicine

## 2017-01-21 DIAGNOSIS — I10 Essential (primary) hypertension: Secondary | ICD-10-CM

## 2017-01-21 MED ORDER — LOSARTAN POTASSIUM-HCTZ 100-12.5 MG PO TABS: ORAL_TABLET | ORAL | 0 refills | Status: DC

## 2017-01-21 NOTE — Telephone Encounter (Signed)
done

## 2017-01-21 NOTE — Telephone Encounter (Signed)
Pt called in and needs a refill on his bp meds until he can get here on the 12th

## 2017-01-22 NOTE — Telephone Encounter (Signed)
Left detailed message on mobile.

## 2017-02-07 ENCOUNTER — Encounter: Payer: BLUE CROSS/BLUE SHIELD | Admitting: Internal Medicine

## 2017-02-14 ENCOUNTER — Other Ambulatory Visit (INDEPENDENT_AMBULATORY_CARE_PROVIDER_SITE_OTHER): Payer: BLUE CROSS/BLUE SHIELD

## 2017-02-14 ENCOUNTER — Encounter: Payer: Self-pay | Admitting: Internal Medicine

## 2017-02-14 ENCOUNTER — Ambulatory Visit (INDEPENDENT_AMBULATORY_CARE_PROVIDER_SITE_OTHER): Payer: BLUE CROSS/BLUE SHIELD | Admitting: Internal Medicine

## 2017-02-14 VITALS — BP 130/80 | HR 81 | Temp 98.3°F | Resp 16 | Ht 66.0 in | Wt 199.8 lb

## 2017-02-14 DIAGNOSIS — I10 Essential (primary) hypertension: Secondary | ICD-10-CM

## 2017-02-14 DIAGNOSIS — R7309 Other abnormal glucose: Secondary | ICD-10-CM

## 2017-02-14 DIAGNOSIS — Z0000 Encounter for general adult medical examination without abnormal findings: Secondary | ICD-10-CM | POA: Diagnosis not present

## 2017-02-14 DIAGNOSIS — E781 Pure hyperglyceridemia: Secondary | ICD-10-CM

## 2017-02-14 DIAGNOSIS — Z1211 Encounter for screening for malignant neoplasm of colon: Secondary | ICD-10-CM | POA: Insufficient documentation

## 2017-02-14 DIAGNOSIS — E785 Hyperlipidemia, unspecified: Secondary | ICD-10-CM

## 2017-02-14 DIAGNOSIS — Z23 Encounter for immunization: Secondary | ICD-10-CM | POA: Diagnosis not present

## 2017-02-14 LAB — HIV ANTIBODY (ROUTINE TESTING W REFLEX): HIV 1&2 Ab, 4th Generation: NONREACTIVE

## 2017-02-14 LAB — COMPREHENSIVE METABOLIC PANEL
ALBUMIN: 4.7 g/dL (ref 3.5–5.2)
ALT: 28 U/L (ref 0–53)
AST: 28 U/L (ref 0–37)
Alkaline Phosphatase: 65 U/L (ref 39–117)
BILIRUBIN TOTAL: 0.6 mg/dL (ref 0.2–1.2)
BUN: 17 mg/dL (ref 6–23)
CALCIUM: 10 mg/dL (ref 8.4–10.5)
CO2: 26 mEq/L (ref 19–32)
CREATININE: 0.88 mg/dL (ref 0.40–1.50)
Chloride: 103 mEq/L (ref 96–112)
GFR: 93.88 mL/min (ref >60.00)
Glucose, Bld: 99 mg/dL (ref 70–99)
Potassium: 4.7 mEq/L (ref 3.5–5.1)
Sodium: 138 mEq/L (ref 135–145)
TOTAL PROTEIN: 7.1 g/dL (ref 6.0–8.3)

## 2017-02-14 LAB — LIPID PANEL
CHOLESTEROL: 220 mg/dL — AB (ref 0–200)
HDL: 62.4 mg/dL (ref >39.00)
LDL Cholesterol: 122 mg/dL — ABNORMAL HIGH (ref 0–99)
NonHDL: 157.54
TRIGLYCERIDES: 176 mg/dL — AB (ref 0.0–149.0)
Total CHOL/HDL Ratio: 4
VLDL: 35.2 mg/dL (ref 0.0–40.0)

## 2017-02-14 LAB — PSA: PSA: 0.3 ng/mL (ref 0.10–4.00)

## 2017-02-14 NOTE — Patient Instructions (Signed)
 Health Maintenance, Male A healthy lifestyle and preventive care is important for your health and wellness. Ask your health care provider about what schedule of regular examinations is right for you. What should I know about weight and diet?  Eat a Healthy Diet  Eat plenty of vegetables, fruits, whole grains, low-fat dairy products, and lean protein.  Do not eat a lot of foods high in solid fats, added sugars, or salt. Maintain a Healthy Weight  Regular exercise can help you achieve or maintain a healthy weight. You should:  Do at least 150 minutes of exercise each week. The exercise should increase your heart rate and make you sweat (moderate-intensity exercise).  Do strength-training exercises at least twice a week. Watch Your Levels of Cholesterol and Blood Lipids  Have your blood tested for lipids and cholesterol every 5 years starting at 60 years of age. If you are at high risk for heart disease, you should start having your blood tested when you are 60 years old. You may need to have your cholesterol levels checked more often if:  Your lipid or cholesterol levels are high.  You are older than 60 years of age.  You are at high risk for heart disease. What should I know about cancer screening? Many types of cancers can be detected early and may often be prevented. Lung Cancer  You should be screened every year for lung cancer if:  You are a current smoker who has smoked for at least 30 years.  You are a former smoker who has quit within the past 15 years.  Talk to your health care provider about your screening options, when you should start screening, and how often you should be screened. Colorectal Cancer  Routine colorectal cancer screening usually begins at 60 years of age and should be repeated every 5-10 years until you are 60 years old. You may need to be screened more often if early forms of precancerous polyps or small growths are found. Your health care provider  may recommend screening at an earlier age if you have risk factors for colon cancer.  Your health care provider may recommend using home test kits to check for hidden blood in the stool.  A small camera at the end of a tube can be used to examine your colon (sigmoidoscopy or colonoscopy). This checks for the earliest forms of colorectal cancer. Prostate and Testicular Cancer  Depending on your age and overall health, your health care provider may do certain tests to screen for prostate and testicular cancer.  Talk to your health care provider about any symptoms or concerns you have about testicular or prostate cancer. Skin Cancer  Check your skin from head to toe regularly.  Tell your health care provider about any new moles or changes in moles, especially if:  There is a change in a mole's size, shape, or color.  You have a mole that is larger than a pencil eraser.  Always use sunscreen. Apply sunscreen liberally and repeat throughout the day.  Protect yourself by wearing long sleeves, pants, a wide-brimmed hat, and sunglasses when outside. What should I know about heart disease, diabetes, and high blood pressure?  If you are 18-39 years of age, have your blood pressure checked every 3-5 years. If you are 40 years of age or older, have your blood pressure checked every year. You should have your blood pressure measured twice-once when you are at a hospital or clinic, and once when you are not at   a hospital or clinic. Record the average of the two measurements. To check your blood pressure when you are not at a hospital or clinic, you can use:  An automated blood pressure machine at a pharmacy.  A home blood pressure monitor.  Talk to your health care provider about your target blood pressure.  If you are between 45-79 years old, ask your health care provider if you should take aspirin to prevent heart disease.  Have regular diabetes screenings by checking your fasting blood sugar  level.  If you are at a normal weight and have a low risk for diabetes, have this test once every three years after the age of 45.  If you are overweight and have a high risk for diabetes, consider being tested at a younger age or more often.  A one-time screening for abdominal aortic aneurysm (AAA) by ultrasound is recommended for men aged 65-75 years who are current or former smokers. What should I know about preventing infection? Hepatitis B  If you have a higher risk for hepatitis B, you should be screened for this virus. Talk with your health care provider to find out if you are at risk for hepatitis B infection. Hepatitis C  Blood testing is recommended for:  Everyone born from 1945 through 1965.  Anyone with known risk factors for hepatitis C. Sexually Transmitted Diseases (STDs)  You should be screened each year for STDs including gonorrhea and chlamydia if:  You are sexually active and are younger than 60 years of age.  You are older than 60 years of age and your health care provider tells you that you are at risk for this type of infection.  Your sexual activity has changed since you were last screened and you are at an increased risk for chlamydia or gonorrhea. Ask your health care provider if you are at risk.  Talk with your health care provider about whether you are at high risk of being infected with HIV. Your health care provider may recommend a prescription medicine to help prevent HIV infection. What else can I do?  Schedule regular health, dental, and eye exams.  Stay current with your vaccines (immunizations).  Do not use any tobacco products, such as cigarettes, chewing tobacco, and e-cigarettes. If you need help quitting, ask your health care provider.  Limit alcohol intake to no more than 2 drinks per day. One drink equals 12 ounces of beer, 5 ounces of wine, or 1 ounces of hard liquor.  Do not use street drugs.  Do not share needles.  Ask your health  care provider for help if you need support or information about quitting drugs.  Tell your health care provider if you often feel depressed.  Tell your health care provider if you have ever been abused or do not feel safe at home. This information is not intended to replace advice given to you by your health care provider. Make sure you discuss any questions you have with your health care provider. Document Released: 05/13/2008 Document Revised: 07/14/2016 Document Reviewed: 08/19/2015 Elsevier Interactive Patient Education  2017 [employer]  

## 2017-02-14 NOTE — Progress Notes (Signed)
Subjective:  Patient ID: Kenneth Offer Sr., male    DOB: 05-Jan-1956  Age: 61 y.o. MRN: [id number]  CC: Hypertension; Hyperlipidemia; and Annual Exam   HPI Kenneth Singleton [facility] Sr. presents for a CPX.  He reports that his blood pressure has been well controlled. He has had no recent episodes of chest pain, shortness of breath, cough, wheezing, palpitations, fatigue, or edema.  Outpatient Medications Prior to Visit  Medication Sig Dispense Refill  . celecoxib (CELEBREX) 200 MG capsule Take 1 capsule (200 mg total) by mouth daily. 90 capsule 3  . gabapentin (NEURONTIN) 100 MG capsule TAKE ONE CAPSULE BY MOUTH 3 TIMES A DAY 90 capsule 3  . losartan-hydrochlorothiazide (HYZAAR) 100-12.5 MG tablet TAKE 1 TABLET BY MOUTH EVERY DAY. NEEDS OV FOR REFILLS 30 tablet 0  . omeprazole (PRILOSEC) 20 MG capsule Take 20 mg by mouth daily.    . pravastatin (PRAVACHOL) 80 MG tablet TAKE 1 TABLET (80 MG TOTAL) BY MOUTH EVERY EVENING. 90 tablet 1  . traMADol (ULTRAM) 50 MG tablet TAKE 1 TABLET BY MOUTH EVERY 8 HOURS AS NEEDED 90 tablet 0  . TUDORZA PRESSAIR 400 MCG/ACT AEPB INHALE 1 PUFF INTO THE LUNGS 2 TIMES DAILY 1 each 11  . Ascorbic Acid (VITAMIN C) 1000 MG tablet Take 1,000 mg by mouth 2 (two) times daily. Reported on 03/15/2016    . aspirin EC 325 MG EC tablet Take 1 tablet (325 mg total) by mouth 2 (two) times daily after a meal. 60 tablet 0  . pyridOXINE (VITAMIN B-6) 100 MG tablet Take 100 mg by mouth daily. Reported on 03/15/2016    . TUDORZA PRESSAIR 400 MCG/ACT AEPB INHALE 1 PUFF INTO THE LUNGS 2 TIMES DAILY 1 each 9   No facility-administered medications prior to visit.     ROS Review of Systems  Constitutional: Negative.  Negative for appetite change, diaphoresis, fatigue and unexpected weight change.  HENT: Negative.  Negative for trouble swallowing.   Eyes: Negative for visual disturbance.  Respiratory: Negative for cough, chest tightness, shortness of breath and wheezing.   Cardiovascular:  Negative for chest pain, palpitations and leg swelling.  Gastrointestinal: Negative for abdominal pain, blood in stool, constipation, diarrhea, nausea and vomiting.  Endocrine: Negative.   Genitourinary: Negative.  Negative for difficulty urinating, dysuria, flank pain, hematuria, penile swelling, scrotal swelling, testicular pain and urgency.  Musculoskeletal: Negative.  Negative for arthralgias and myalgias.  Skin: Negative.   Allergic/Immunologic: Negative.   Neurological: Negative.   Hematological: Negative.  Negative for adenopathy. Does not bruise/bleed easily.  Psychiatric/Behavioral: Negative.     Objective:  BP 130/80 (BP Location: Left Arm, Patient Position: Sitting, Cuff Size: Large)   Pulse 81   Temp 98.3 F (36.8 C) (Oral)   Resp 16   Ht 5' 6"  (1.676 m)   Wt 199 lb 12 oz (90.6 kg)   SpO2 96%   BMI 32.24 kg/m   BP Readings from Last 3 Encounters:  02/14/17 130/80  03/15/16 110/80  04/04/15 122/69    Wt Readings from Last 3 Encounters:  02/14/17 199 lb 12 oz (90.6 kg)  03/15/16 201 lb (91.2 kg)  04/03/15 206 lb (93.4 kg)    Physical Exam  Constitutional: He is oriented to person, place, and time. No distress.  HENT:  Head: Normocephalic and atraumatic.  Mouth/Throat: Oropharynx is clear and moist. No oropharyngeal exudate.  Eyes: Conjunctivae are normal. Right eye exhibits no discharge. Left eye exhibits no discharge. No scleral icterus.  Neck:  Normal range of motion. Neck supple. No JVD present. No tracheal deviation present. No thyromegaly present.  Cardiovascular: Normal rate, regular rhythm, normal heart sounds and intact distal pulses.  Exam reveals no gallop and no friction rub.   No murmur heard. EKG -  Sinus  Rhythm  WITHIN NORMAL LIMITS  Pulmonary/Chest: Effort normal and breath sounds normal. No stridor. No respiratory distress. He has no wheezes. He has no rales. He exhibits no tenderness.  Abdominal: Soft. Bowel sounds are normal. He exhibits  no distension and no mass. There is no tenderness. There is no rebound and no guarding. Hernia confirmed negative in the right inguinal area and confirmed negative in the left inguinal area.  Genitourinary: Prostate normal, testes normal and penis normal. Rectal exam shows internal hemorrhoid. Rectal exam shows no external hemorrhoid, no fissure, no mass, no tenderness, anal tone normal and guaiac negative stool. Prostate is not enlarged and not tender. Right testis shows no mass, no swelling and no tenderness. Right testis is descended. Left testis shows no mass, no swelling and no tenderness. Left testis is descended. Circumcised. No penile erythema or penile tenderness. No discharge found.  Musculoskeletal: Normal range of motion. He exhibits no edema, tenderness or deformity.  Lymphadenopathy:    He has no cervical adenopathy.       Right: No inguinal adenopathy present.       Left: No inguinal adenopathy present.  Neurological: He is oriented to person, place, and time.  Skin: Skin is warm and dry. No rash noted. He is not diaphoretic. No erythema. No pallor.  Psychiatric: He has a normal mood and affect. His behavior is normal. Judgment and thought content normal.  Vitals reviewed.   Lab Results  Component Value Date   WBC 8.0 03/15/2016   HGB 15.6 03/15/2016   HCT 45.1 03/15/2016   PLT 253.0 03/15/2016   GLUCOSE 99 02/14/2017   CHOL 220 (H) 02/14/2017   TRIG 176.0 (H) 02/14/2017   HDL 62.40 02/14/2017   LDLDIRECT 107.0 03/15/2016   LDLCALC 122 (H) 02/14/2017   ALT 28 02/14/2017   AST 28 02/14/2017   NA 138 02/14/2017   K 4.7 02/14/2017   CL 103 02/14/2017   CREATININE 0.88 02/14/2017   BUN 17 02/14/2017   CO2 26 02/14/2017   TSH 2.28 03/15/2016   PSA 0.30 02/14/2017   INR 0.94 03/26/2015   HGBA1C 5.2 03/15/2016    No results found.  Assessment & Plan:   Cosmo was seen today for hypertension, hyperlipidemia and annual exam.  Diagnoses and all orders for this  visit:  Essential hypertension- his blood pressure is well-controlled, electrolytes and renal function are normal. His EKG is negative for LVH or signs of ischemia. -     Comprehensive metabolic panel; Future -     EKG 12-Lead  Hypertriglyceridemia, essential- his triglycerides are very slightly elevated and do not require medical therapy, he was encouraged to improve his lifestyle modifications. -     Lipid panel; Future  Other abnormal glucose- his blood sugar is normal today -     Comprehensive metabolic panel; Future  Dyslipidemia, goal LDL below 130- he is achieved his LDL goal is doing well on the statin. -     Lipid panel; Future  Routine general medical examination at a health care facility- he was referred for follow-up colonoscopy to screen for colon cancer, exam completed, labs ordered and reviewed, vaccines reviewed and updated, patient education material was given. -  PSA; Future -     HIV antibody; Future  Screen for colon cancer -     Ambulatory referral to Gastroenterology  Need for prophylactic vaccination against viral hepatitis -     Hepatitis A hepatitis B combined vaccine IM   I have discontinued Mr. Capano vitamin C, pyridOXINE, and aspirin. I am also having him maintain his omeprazole, celecoxib, traMADol, TUDORZA PRESSAIR, gabapentin, pravastatin, and losartan-hydrochlorothiazide.  No orders of the defined types were placed in this encounter.    Follow-up: Return in about 6 months (around 08/17/2017).  Scarlette Calico, MD

## 2017-02-14 NOTE — Progress Notes (Signed)
Pre visit review using our clinic review tool, if applicable. No additional management support is needed unless otherwise documented below in the visit note. 

## 2017-02-15 ENCOUNTER — Encounter: Payer: Self-pay | Admitting: Internal Medicine

## 2017-02-18 ENCOUNTER — Other Ambulatory Visit: Payer: Self-pay | Admitting: Internal Medicine

## 2017-02-18 DIAGNOSIS — I10 Essential (primary) hypertension: Secondary | ICD-10-CM

## 2017-02-24 ENCOUNTER — Other Ambulatory Visit: Payer: Self-pay | Admitting: Internal Medicine

## 2017-02-24 DIAGNOSIS — G8929 Other chronic pain: Secondary | ICD-10-CM

## 2017-02-24 DIAGNOSIS — M549 Dorsalgia, unspecified: Principal | ICD-10-CM

## 2017-02-24 DIAGNOSIS — M159 Polyosteoarthritis, unspecified: Secondary | ICD-10-CM

## 2017-02-24 DIAGNOSIS — M150 Primary generalized (osteo)arthritis: Secondary | ICD-10-CM

## 2017-03-17 ENCOUNTER — Ambulatory Visit (INDEPENDENT_AMBULATORY_CARE_PROVIDER_SITE_OTHER): Payer: BLUE CROSS/BLUE SHIELD | Admitting: General Practice

## 2017-03-17 DIAGNOSIS — Z23 Encounter for immunization: Secondary | ICD-10-CM | POA: Diagnosis not present

## 2017-05-20 ENCOUNTER — Other Ambulatory Visit: Payer: Self-pay | Admitting: Internal Medicine

## 2017-06-18 ENCOUNTER — Other Ambulatory Visit: Payer: Self-pay | Admitting: Internal Medicine

## 2017-08-11 ENCOUNTER — Other Ambulatory Visit: Payer: Self-pay | Admitting: Internal Medicine

## 2017-09-01 ENCOUNTER — Ambulatory Visit: Payer: BLUE CROSS/BLUE SHIELD | Admitting: Internal Medicine

## 2017-09-20 ENCOUNTER — Encounter: Payer: Self-pay | Admitting: Internal Medicine

## 2017-09-20 ENCOUNTER — Other Ambulatory Visit (INDEPENDENT_AMBULATORY_CARE_PROVIDER_SITE_OTHER): Payer: BLUE CROSS/BLUE SHIELD

## 2017-09-20 ENCOUNTER — Telehealth: Payer: Self-pay

## 2017-09-20 ENCOUNTER — Ambulatory Visit (INDEPENDENT_AMBULATORY_CARE_PROVIDER_SITE_OTHER): Payer: BLUE CROSS/BLUE SHIELD | Admitting: Internal Medicine

## 2017-09-20 VITALS — BP 138/74 | HR 72 | Temp 98.1°F | Resp 16 | Ht 66.0 in | Wt 213.0 lb

## 2017-09-20 DIAGNOSIS — E785 Hyperlipidemia, unspecified: Secondary | ICD-10-CM

## 2017-09-20 DIAGNOSIS — I10 Essential (primary) hypertension: Secondary | ICD-10-CM | POA: Diagnosis not present

## 2017-09-20 DIAGNOSIS — Z23 Encounter for immunization: Secondary | ICD-10-CM | POA: Diagnosis not present

## 2017-09-20 DIAGNOSIS — E781 Pure hyperglyceridemia: Secondary | ICD-10-CM

## 2017-09-20 LAB — LIPID PANEL
CHOL/HDL RATIO: 4
CHOLESTEROL: 204 mg/dL — AB (ref 0–200)
HDL: 51.7 mg/dL (ref >39.00)

## 2017-09-20 LAB — LDL CHOLESTEROL, DIRECT: LDL DIRECT: 91 mg/dL

## 2017-09-20 LAB — BASIC METABOLIC PANEL
BUN: 16 mg/dL (ref 6–23)
CHLORIDE: 101 meq/L (ref 96–112)
CO2: 28 mEq/L (ref 19–32)
Calcium: 9 mg/dL (ref 8.4–10.5)
Creatinine, Ser: 0.95 mg/dL (ref 0.40–1.50)
GFR: 85.77 mL/min (ref >60.00)
Glucose, Bld: 111 mg/dL — ABNORMAL HIGH (ref 70–99)
POTASSIUM: 4.3 meq/L (ref 3.5–5.1)
Sodium: 137 mEq/L (ref 135–145)

## 2017-09-20 LAB — CK: CK TOTAL: 72 U/L (ref 7–232)

## 2017-09-20 MED ORDER — ICOSAPENT ETHYL 1 G PO CAPS: 2.0000 | ORAL_CAPSULE | Freq: Two times a day (BID) | ORAL | 1 refills | Status: DC

## 2017-09-20 NOTE — Progress Notes (Signed)
Subjective:  Patient ID: Kenneth Offer Sr., male    DOB: November 29, 1956  Age: 61 y.o. MRN: [id number]  CC: Hypertension and Hyperlipidemia   HPI Kenneth Singleton [facility] Sr. presents for f/up -he complains of aches in his large joints but otherwise feels well and offers no complaints.  He tells me his blood pressure has been well controlled.  Outpatient Medications Prior to Visit  Medication Sig Dispense Refill  . celecoxib (CELEBREX) 200 MG capsule TAKE ONE CAPSULE BY MOUTH EVERY DAY 90 capsule 3  . gabapentin (NEURONTIN) 100 MG capsule TAKE ONE CAPSULE BY MOUTH 3 TIMES A DAY 90 capsule 3  . losartan-hydrochlorothiazide (HYZAAR) 100-12.5 MG tablet Take 1 tablet by mouth daily. 90 tablet 3  . omeprazole (PRILOSEC) 20 MG capsule Take 20 mg by mouth daily.    . pravastatin (PRAVACHOL) 80 MG tablet TAKE 1 TABLET (80 MG TOTAL) BY MOUTH EVERY EVENING. DUE FOR F/U APPT IN SEPTEMBER 90 tablet 0  . traMADol (ULTRAM) 50 MG tablet TAKE 1 TABLET BY MOUTH EVERY 8 HOURS AS NEEDED 90 tablet 0  . TUDORZA PRESSAIR 400 MCG/ACT AEPB INHALE 1 PUFF INTO THE LUNGS 2 TIMES DAILY 1 each 11   No facility-administered medications prior to visit.     ROS Review of Systems  Constitutional: Negative.  Negative for appetite change, diaphoresis, fatigue and unexpected weight change.  HENT: Negative.  Negative for trouble swallowing.   Eyes: Negative.  Negative for visual disturbance.  Respiratory: Negative.  Negative for cough, chest tightness, shortness of breath and wheezing.   Cardiovascular: Negative for chest pain, palpitations and leg swelling.  Gastrointestinal: Negative for abdominal pain, constipation, diarrhea, nausea and vomiting.  Endocrine: Negative.   Genitourinary: Negative.  Negative for difficulty urinating, hematuria and urgency.  Musculoskeletal: Positive for arthralgias. Negative for back pain, myalgias and neck pain.  Skin: Negative.  Negative for color change.  Allergic/Immunologic: Negative.     Neurological: Negative.  Negative for dizziness, weakness and headaches.  Hematological: Negative for adenopathy. Does not bruise/bleed easily.  Psychiatric/Behavioral: Negative.     Objective:  BP 138/74 (BP Location: Left Arm, Patient Position: Sitting, Cuff Size: Normal)   Pulse 72   Temp 98.1 F (36.7 C) (Oral)   Resp 16   Ht 5' 6"  (1.676 m)   Wt 213 lb (96.6 kg)   SpO2 99%   BMI 34.38 kg/m   BP Readings from Last 3 Encounters:  09/20/17 138/74  02/14/17 130/80  03/15/16 110/80    Wt Readings from Last 3 Encounters:  09/20/17 213 lb (96.6 kg)  02/14/17 199 lb 12 oz (90.6 kg)  03/15/16 201 lb (91.2 kg)    Physical Exam  Constitutional: He is oriented to person, place, and time. No distress.  HENT:  Mouth/Throat: Oropharynx is clear and moist. No oropharyngeal exudate.  Eyes: Conjunctivae are normal. Right eye exhibits no discharge. Left eye exhibits no discharge. No scleral icterus.  Neck: Normal range of motion. Neck supple. No JVD present. No thyromegaly present.  Cardiovascular: Normal rate, regular rhythm and intact distal pulses.  Exam reveals no gallop and no friction rub.   No murmur heard. Pulmonary/Chest: Effort normal and breath sounds normal. No respiratory distress. He has no wheezes. He has no rales. He exhibits no tenderness.  Abdominal: Soft. Bowel sounds are normal. He exhibits no distension and no mass. There is no tenderness. There is no rebound and no guarding.  Musculoskeletal: Normal range of motion. He exhibits no edema, tenderness or  deformity.  Lymphadenopathy:    He has no cervical adenopathy.  Neurological: He is alert and oriented to person, place, and time.  Skin: Skin is warm and dry. No rash noted. He is not diaphoretic. No erythema. No pallor.  Vitals reviewed.   Lab Results  Component Value Date   WBC 8.0 03/15/2016   HGB 15.6 03/15/2016   HCT 45.1 03/15/2016   PLT 253.0 03/15/2016   GLUCOSE 111 (H) 09/20/2017   CHOL 204 (H)  09/20/2017   TRIG (H) 09/20/2017    412.0 Triglyceride is over 400; calculations on Lipids are invalid.   HDL 51.70 09/20/2017   LDLDIRECT 91.0 09/20/2017   LDLCALC 122 (H) 02/14/2017   ALT 28 02/14/2017   AST 28 02/14/2017   NA 137 09/20/2017   K 4.3 09/20/2017   CL 101 09/20/2017   CREATININE 0.95 09/20/2017   BUN 16 09/20/2017   CO2 28 09/20/2017   TSH 2.28 03/15/2016   PSA 0.30 02/14/2017   INR 0.94 03/26/2015   HGBA1C 5.2 03/15/2016    No results found.  Assessment & Plan:   Kenneth Singleton was seen today for hypertension and hyperlipidemia.  Diagnoses and all orders for this visit:  Essential hypertension- his blood pressure is well controlled.  Electrolytes and renal function are normal. -     Basic metabolic panel; Future  Dyslipidemia, goal LDL below 130- he has achieved his LDL goal and is doing well on the statin.  His CPK is negative for any evidence of myositis. -     Lipid panel; Future -     CK; Future  Hypertriglyceridemia, essential-we will start omega-3 fish oil to lower the triglycerides and to prevent complications such as pancreatitis. -     Lipid panel; Future -     Icosapent Ethyl (VASCEPA) 1 g CAPS; Take 2 capsules by mouth 2 (two) times daily.  Need for hepatitis A and B vaccination -     Hepatitis A hepatitis B combined vaccine IM   I am having Kenneth Singleton start on Icosapent Ethyl. I am also having him maintain his omeprazole, traMADol, TUDORZA PRESSAIR, losartan-hydrochlorothiazide, celecoxib, gabapentin, and pravastatin.  Meds ordered this encounter  Medications  . Icosapent Ethyl (VASCEPA) 1 g CAPS    Sig: Take 2 capsules by mouth 2 (two) times daily.    Dispense:  360 capsule    Refill:  1     Follow-up: Return in about 6 months (around 03/21/2018).  Scarlette Calico, MD

## 2017-09-20 NOTE — Patient Instructions (Signed)

## 2017-09-20 NOTE — Telephone Encounter (Signed)
Order [id number]

## 2017-09-27 DIAGNOSIS — Z1212 Encounter for screening for malignant neoplasm of rectum: Secondary | ICD-10-CM | POA: Diagnosis not present

## 2017-09-27 DIAGNOSIS — Z1211 Encounter for screening for malignant neoplasm of colon: Secondary | ICD-10-CM | POA: Diagnosis not present

## 2017-09-27 LAB — COLOGUARD: COLOGUARD: NEGATIVE

## 2017-10-06 NOTE — Telephone Encounter (Signed)
Results recieved

## 2017-10-28 ENCOUNTER — Other Ambulatory Visit: Payer: Self-pay | Admitting: Internal Medicine

## 2017-11-04 ENCOUNTER — Other Ambulatory Visit: Payer: Self-pay | Admitting: Internal Medicine

## 2017-12-02 DIAGNOSIS — M7541 Impingement syndrome of right shoulder: Secondary | ICD-10-CM | POA: Diagnosis not present

## 2017-12-02 DIAGNOSIS — M19011 Primary osteoarthritis, right shoulder: Secondary | ICD-10-CM | POA: Diagnosis not present

## 2017-12-09 DIAGNOSIS — M25511 Pain in right shoulder: Secondary | ICD-10-CM | POA: Diagnosis not present

## 2017-12-23 ENCOUNTER — Telehealth: Payer: Self-pay

## 2017-12-23 NOTE — Telephone Encounter (Signed)
Key: Delight Ovens

## 2017-12-27 ENCOUNTER — Telehealth: Payer: Self-pay

## 2017-12-27 DIAGNOSIS — M19012 Primary osteoarthritis, left shoulder: Secondary | ICD-10-CM | POA: Insufficient documentation

## 2017-12-28 DIAGNOSIS — M75121 Complete rotator cuff tear or rupture of right shoulder, not specified as traumatic: Secondary | ICD-10-CM | POA: Diagnosis not present

## 2017-12-28 DIAGNOSIS — M13811 Other specified arthritis, right shoulder: Secondary | ICD-10-CM | POA: Diagnosis not present

## 2017-12-28 DIAGNOSIS — M75120 Complete rotator cuff tear or rupture of unspecified shoulder, not specified as traumatic: Secondary | ICD-10-CM | POA: Insufficient documentation

## 2017-12-28 DIAGNOSIS — M7541 Impingement syndrome of right shoulder: Secondary | ICD-10-CM | POA: Diagnosis not present

## 2017-12-29 NOTE — Telephone Encounter (Signed)
Message already taken care of

## 2017-12-30 NOTE — Telephone Encounter (Signed)
PA was approved. Can you informed pt of same and have him contact pharmacy to have the medication filled.

## 2017-12-30 NOTE — Telephone Encounter (Signed)
Spoke to wife and let her know.

## 2018-01-13 DIAGNOSIS — M24111 Other articular cartilage disorders, right shoulder: Secondary | ICD-10-CM | POA: Diagnosis not present

## 2018-01-13 DIAGNOSIS — M7501 Adhesive capsulitis of right shoulder: Secondary | ICD-10-CM | POA: Diagnosis not present

## 2018-01-13 DIAGNOSIS — M659 Synovitis and tenosynovitis, unspecified: Secondary | ICD-10-CM | POA: Diagnosis not present

## 2018-01-13 DIAGNOSIS — X58XXXA Exposure to other specified factors, initial encounter: Secondary | ICD-10-CM | POA: Diagnosis not present

## 2018-01-13 DIAGNOSIS — Y999 Unspecified external cause status: Secondary | ICD-10-CM | POA: Diagnosis not present

## 2018-01-13 DIAGNOSIS — M7541 Impingement syndrome of right shoulder: Secondary | ICD-10-CM | POA: Diagnosis not present

## 2018-01-13 DIAGNOSIS — S46011A Strain of muscle(s) and tendon(s) of the rotator cuff of right shoulder, initial encounter: Secondary | ICD-10-CM | POA: Diagnosis not present

## 2018-01-13 DIAGNOSIS — M19011 Primary osteoarthritis, right shoulder: Secondary | ICD-10-CM | POA: Diagnosis not present

## 2018-01-13 DIAGNOSIS — R600 Localized edema: Secondary | ICD-10-CM | POA: Diagnosis not present

## 2018-01-13 DIAGNOSIS — Z4889 Encounter for other specified surgical aftercare: Secondary | ICD-10-CM | POA: Diagnosis not present

## 2018-01-13 DIAGNOSIS — G8918 Other acute postprocedural pain: Secondary | ICD-10-CM | POA: Diagnosis not present

## 2018-01-13 DIAGNOSIS — M75121 Complete rotator cuff tear or rupture of right shoulder, not specified as traumatic: Secondary | ICD-10-CM | POA: Diagnosis not present

## 2018-01-13 DIAGNOSIS — M75120 Complete rotator cuff tear or rupture of unspecified shoulder, not specified as traumatic: Secondary | ICD-10-CM | POA: Diagnosis not present

## 2018-01-14 DIAGNOSIS — R600 Localized edema: Secondary | ICD-10-CM | POA: Diagnosis not present

## 2018-01-14 DIAGNOSIS — M19011 Primary osteoarthritis, right shoulder: Secondary | ICD-10-CM | POA: Diagnosis not present

## 2018-01-14 DIAGNOSIS — M75121 Complete rotator cuff tear or rupture of right shoulder, not specified as traumatic: Secondary | ICD-10-CM | POA: Diagnosis not present

## 2018-01-14 DIAGNOSIS — Z4889 Encounter for other specified surgical aftercare: Secondary | ICD-10-CM | POA: Diagnosis not present

## 2018-01-15 DIAGNOSIS — Z4889 Encounter for other specified surgical aftercare: Secondary | ICD-10-CM | POA: Diagnosis not present

## 2018-01-15 DIAGNOSIS — M19011 Primary osteoarthritis, right shoulder: Secondary | ICD-10-CM | POA: Diagnosis not present

## 2018-01-15 DIAGNOSIS — R600 Localized edema: Secondary | ICD-10-CM | POA: Diagnosis not present

## 2018-01-15 DIAGNOSIS — M75121 Complete rotator cuff tear or rupture of right shoulder, not specified as traumatic: Secondary | ICD-10-CM | POA: Diagnosis not present

## 2018-01-16 DIAGNOSIS — M19011 Primary osteoarthritis, right shoulder: Secondary | ICD-10-CM | POA: Diagnosis not present

## 2018-01-16 DIAGNOSIS — M75121 Complete rotator cuff tear or rupture of right shoulder, not specified as traumatic: Secondary | ICD-10-CM | POA: Diagnosis not present

## 2018-01-16 DIAGNOSIS — Z4889 Encounter for other specified surgical aftercare: Secondary | ICD-10-CM | POA: Diagnosis not present

## 2018-01-16 DIAGNOSIS — R600 Localized edema: Secondary | ICD-10-CM | POA: Diagnosis not present

## 2018-01-17 DIAGNOSIS — R600 Localized edema: Secondary | ICD-10-CM | POA: Diagnosis not present

## 2018-01-17 DIAGNOSIS — Z4889 Encounter for other specified surgical aftercare: Secondary | ICD-10-CM | POA: Diagnosis not present

## 2018-01-17 DIAGNOSIS — M19011 Primary osteoarthritis, right shoulder: Secondary | ICD-10-CM | POA: Diagnosis not present

## 2018-01-17 DIAGNOSIS — M75121 Complete rotator cuff tear or rupture of right shoulder, not specified as traumatic: Secondary | ICD-10-CM | POA: Diagnosis not present

## 2018-01-18 DIAGNOSIS — R600 Localized edema: Secondary | ICD-10-CM | POA: Diagnosis not present

## 2018-01-18 DIAGNOSIS — M75121 Complete rotator cuff tear or rupture of right shoulder, not specified as traumatic: Secondary | ICD-10-CM | POA: Diagnosis not present

## 2018-01-18 DIAGNOSIS — Z4889 Encounter for other specified surgical aftercare: Secondary | ICD-10-CM | POA: Diagnosis not present

## 2018-01-18 DIAGNOSIS — M19011 Primary osteoarthritis, right shoulder: Secondary | ICD-10-CM | POA: Diagnosis not present

## 2018-01-19 DIAGNOSIS — R600 Localized edema: Secondary | ICD-10-CM | POA: Diagnosis not present

## 2018-01-19 DIAGNOSIS — M19011 Primary osteoarthritis, right shoulder: Secondary | ICD-10-CM | POA: Diagnosis not present

## 2018-01-19 DIAGNOSIS — M75121 Complete rotator cuff tear or rupture of right shoulder, not specified as traumatic: Secondary | ICD-10-CM | POA: Diagnosis not present

## 2018-01-19 DIAGNOSIS — Z4889 Encounter for other specified surgical aftercare: Secondary | ICD-10-CM | POA: Diagnosis not present

## 2018-01-20 DIAGNOSIS — R600 Localized edema: Secondary | ICD-10-CM | POA: Diagnosis not present

## 2018-01-20 DIAGNOSIS — M75121 Complete rotator cuff tear or rupture of right shoulder, not specified as traumatic: Secondary | ICD-10-CM | POA: Diagnosis not present

## 2018-01-20 DIAGNOSIS — Z4889 Encounter for other specified surgical aftercare: Secondary | ICD-10-CM | POA: Diagnosis not present

## 2018-01-20 DIAGNOSIS — M19011 Primary osteoarthritis, right shoulder: Secondary | ICD-10-CM | POA: Diagnosis not present

## 2018-01-21 DIAGNOSIS — R600 Localized edema: Secondary | ICD-10-CM | POA: Diagnosis not present

## 2018-01-21 DIAGNOSIS — M19011 Primary osteoarthritis, right shoulder: Secondary | ICD-10-CM | POA: Diagnosis not present

## 2018-01-21 DIAGNOSIS — M75121 Complete rotator cuff tear or rupture of right shoulder, not specified as traumatic: Secondary | ICD-10-CM | POA: Diagnosis not present

## 2018-01-21 DIAGNOSIS — Z4889 Encounter for other specified surgical aftercare: Secondary | ICD-10-CM | POA: Diagnosis not present

## 2018-01-22 DIAGNOSIS — R600 Localized edema: Secondary | ICD-10-CM | POA: Diagnosis not present

## 2018-01-22 DIAGNOSIS — M19011 Primary osteoarthritis, right shoulder: Secondary | ICD-10-CM | POA: Diagnosis not present

## 2018-01-22 DIAGNOSIS — Z4889 Encounter for other specified surgical aftercare: Secondary | ICD-10-CM | POA: Diagnosis not present

## 2018-01-22 DIAGNOSIS — M75121 Complete rotator cuff tear or rupture of right shoulder, not specified as traumatic: Secondary | ICD-10-CM | POA: Diagnosis not present

## 2018-01-23 DIAGNOSIS — M75121 Complete rotator cuff tear or rupture of right shoulder, not specified as traumatic: Secondary | ICD-10-CM | POA: Diagnosis not present

## 2018-01-23 DIAGNOSIS — R600 Localized edema: Secondary | ICD-10-CM | POA: Diagnosis not present

## 2018-01-23 DIAGNOSIS — Z4889 Encounter for other specified surgical aftercare: Secondary | ICD-10-CM | POA: Diagnosis not present

## 2018-01-23 DIAGNOSIS — M19011 Primary osteoarthritis, right shoulder: Secondary | ICD-10-CM | POA: Diagnosis not present

## 2018-01-24 DIAGNOSIS — Z4889 Encounter for other specified surgical aftercare: Secondary | ICD-10-CM | POA: Diagnosis not present

## 2018-01-24 DIAGNOSIS — M19011 Primary osteoarthritis, right shoulder: Secondary | ICD-10-CM | POA: Diagnosis not present

## 2018-01-24 DIAGNOSIS — M75121 Complete rotator cuff tear or rupture of right shoulder, not specified as traumatic: Secondary | ICD-10-CM | POA: Diagnosis not present

## 2018-01-24 DIAGNOSIS — R600 Localized edema: Secondary | ICD-10-CM | POA: Diagnosis not present

## 2018-01-25 DIAGNOSIS — M19011 Primary osteoarthritis, right shoulder: Secondary | ICD-10-CM | POA: Diagnosis not present

## 2018-01-25 DIAGNOSIS — M75121 Complete rotator cuff tear or rupture of right shoulder, not specified as traumatic: Secondary | ICD-10-CM | POA: Diagnosis not present

## 2018-01-25 DIAGNOSIS — R600 Localized edema: Secondary | ICD-10-CM | POA: Diagnosis not present

## 2018-01-25 DIAGNOSIS — Z4889 Encounter for other specified surgical aftercare: Secondary | ICD-10-CM | POA: Diagnosis not present

## 2018-01-26 DIAGNOSIS — M75121 Complete rotator cuff tear or rupture of right shoulder, not specified as traumatic: Secondary | ICD-10-CM | POA: Diagnosis not present

## 2018-01-26 DIAGNOSIS — M19011 Primary osteoarthritis, right shoulder: Secondary | ICD-10-CM | POA: Diagnosis not present

## 2018-01-26 DIAGNOSIS — R600 Localized edema: Secondary | ICD-10-CM | POA: Diagnosis not present

## 2018-01-26 DIAGNOSIS — Z4889 Encounter for other specified surgical aftercare: Secondary | ICD-10-CM | POA: Diagnosis not present

## 2018-01-27 DIAGNOSIS — M75121 Complete rotator cuff tear or rupture of right shoulder, not specified as traumatic: Secondary | ICD-10-CM | POA: Diagnosis not present

## 2018-01-27 DIAGNOSIS — Z4889 Encounter for other specified surgical aftercare: Secondary | ICD-10-CM | POA: Diagnosis not present

## 2018-01-27 DIAGNOSIS — R600 Localized edema: Secondary | ICD-10-CM | POA: Diagnosis not present

## 2018-01-27 DIAGNOSIS — M19011 Primary osteoarthritis, right shoulder: Secondary | ICD-10-CM | POA: Diagnosis not present

## 2018-02-06 DIAGNOSIS — M25511 Pain in right shoulder: Secondary | ICD-10-CM | POA: Diagnosis not present

## 2018-02-08 ENCOUNTER — Other Ambulatory Visit: Payer: Self-pay | Admitting: Internal Medicine

## 2018-02-08 DIAGNOSIS — I10 Essential (primary) hypertension: Secondary | ICD-10-CM

## 2018-02-12 ENCOUNTER — Other Ambulatory Visit: Payer: Self-pay | Admitting: Internal Medicine

## 2018-02-12 DIAGNOSIS — I10 Essential (primary) hypertension: Secondary | ICD-10-CM

## 2018-02-17 DIAGNOSIS — M25511 Pain in right shoulder: Secondary | ICD-10-CM | POA: Diagnosis not present

## 2018-02-23 DIAGNOSIS — M25511 Pain in right shoulder: Secondary | ICD-10-CM | POA: Diagnosis not present

## 2018-02-28 DIAGNOSIS — M25519 Pain in unspecified shoulder: Secondary | ICD-10-CM | POA: Diagnosis not present

## 2018-03-02 DIAGNOSIS — M25519 Pain in unspecified shoulder: Secondary | ICD-10-CM | POA: Diagnosis not present

## 2018-03-07 DIAGNOSIS — M25519 Pain in unspecified shoulder: Secondary | ICD-10-CM | POA: Diagnosis not present

## 2018-03-09 DIAGNOSIS — M25519 Pain in unspecified shoulder: Secondary | ICD-10-CM | POA: Diagnosis not present

## 2018-03-12 ENCOUNTER — Other Ambulatory Visit: Payer: Self-pay | Admitting: Internal Medicine

## 2018-03-14 DIAGNOSIS — M25519 Pain in unspecified shoulder: Secondary | ICD-10-CM | POA: Diagnosis not present

## 2018-03-16 DIAGNOSIS — M25511 Pain in right shoulder: Secondary | ICD-10-CM | POA: Diagnosis not present

## 2018-03-21 DIAGNOSIS — M25519 Pain in unspecified shoulder: Secondary | ICD-10-CM | POA: Diagnosis not present

## 2018-03-23 DIAGNOSIS — M25519 Pain in unspecified shoulder: Secondary | ICD-10-CM | POA: Diagnosis not present

## 2018-03-25 ENCOUNTER — Other Ambulatory Visit: Payer: Self-pay | Admitting: Internal Medicine

## 2018-03-25 DIAGNOSIS — E781 Pure hyperglyceridemia: Secondary | ICD-10-CM

## 2018-03-28 DIAGNOSIS — M25511 Pain in right shoulder: Secondary | ICD-10-CM | POA: Diagnosis not present

## 2018-04-04 DIAGNOSIS — M25511 Pain in right shoulder: Secondary | ICD-10-CM | POA: Diagnosis not present

## 2018-04-06 DIAGNOSIS — M25519 Pain in unspecified shoulder: Secondary | ICD-10-CM | POA: Diagnosis not present

## 2018-04-11 DIAGNOSIS — M25519 Pain in unspecified shoulder: Secondary | ICD-10-CM | POA: Diagnosis not present

## 2018-04-13 DIAGNOSIS — M25519 Pain in unspecified shoulder: Secondary | ICD-10-CM | POA: Diagnosis not present

## 2018-04-18 DIAGNOSIS — M25519 Pain in unspecified shoulder: Secondary | ICD-10-CM | POA: Diagnosis not present

## 2018-04-20 DIAGNOSIS — M25519 Pain in unspecified shoulder: Secondary | ICD-10-CM | POA: Diagnosis not present

## 2018-04-25 DIAGNOSIS — M25519 Pain in unspecified shoulder: Secondary | ICD-10-CM | POA: Diagnosis not present

## 2018-04-27 DIAGNOSIS — M25519 Pain in unspecified shoulder: Secondary | ICD-10-CM | POA: Diagnosis not present

## 2018-05-02 DIAGNOSIS — M25519 Pain in unspecified shoulder: Secondary | ICD-10-CM | POA: Diagnosis not present

## 2018-05-05 DIAGNOSIS — M25519 Pain in unspecified shoulder: Secondary | ICD-10-CM | POA: Diagnosis not present

## 2018-05-09 DIAGNOSIS — M25511 Pain in right shoulder: Secondary | ICD-10-CM | POA: Diagnosis not present

## 2018-05-11 DIAGNOSIS — M25519 Pain in unspecified shoulder: Secondary | ICD-10-CM | POA: Diagnosis not present

## 2018-05-12 ENCOUNTER — Other Ambulatory Visit: Payer: Self-pay | Admitting: Internal Medicine

## 2018-05-14 ENCOUNTER — Other Ambulatory Visit: Payer: Self-pay | Admitting: Internal Medicine

## 2018-05-14 DIAGNOSIS — I10 Essential (primary) hypertension: Secondary | ICD-10-CM

## 2018-05-15 ENCOUNTER — Other Ambulatory Visit: Payer: Self-pay | Admitting: Internal Medicine

## 2018-05-15 DIAGNOSIS — I10 Essential (primary) hypertension: Secondary | ICD-10-CM

## 2018-05-16 ENCOUNTER — Other Ambulatory Visit: Payer: Self-pay | Admitting: Internal Medicine

## 2018-05-16 DIAGNOSIS — I10 Essential (primary) hypertension: Secondary | ICD-10-CM

## 2018-05-16 DIAGNOSIS — M25511 Pain in right shoulder: Secondary | ICD-10-CM | POA: Diagnosis not present

## 2018-05-18 DIAGNOSIS — M25511 Pain in right shoulder: Secondary | ICD-10-CM | POA: Diagnosis not present

## 2018-05-23 DIAGNOSIS — M25519 Pain in unspecified shoulder: Secondary | ICD-10-CM | POA: Diagnosis not present

## 2018-05-25 DIAGNOSIS — M25511 Pain in right shoulder: Secondary | ICD-10-CM | POA: Diagnosis not present

## 2018-07-14 ENCOUNTER — Other Ambulatory Visit: Payer: Self-pay | Admitting: Internal Medicine

## 2018-08-07 ENCOUNTER — Other Ambulatory Visit: Payer: Self-pay | Admitting: Internal Medicine

## 2018-09-07 ENCOUNTER — Other Ambulatory Visit: Payer: Self-pay | Admitting: Internal Medicine

## 2018-09-10 ENCOUNTER — Telehealth: Payer: Self-pay | Admitting: Internal Medicine

## 2018-09-11 ENCOUNTER — Ambulatory Visit: Payer: BLUE CROSS/BLUE SHIELD | Admitting: Family Medicine

## 2018-09-15 ENCOUNTER — Other Ambulatory Visit: Payer: Self-pay | Admitting: Internal Medicine

## 2018-09-15 DIAGNOSIS — E781 Pure hyperglyceridemia: Secondary | ICD-10-CM

## 2018-09-15 MED ORDER — PRAVASTATIN SODIUM 80 MG PO TABS: 80.0000 mg | ORAL_TABLET | Freq: Every evening | ORAL | 0 refills | Status: DC

## 2018-09-15 NOTE — Addendum Note (Signed)
Addended by: Earnstine Regal on: 09/15/2018 11:09 AM   Modules accepted: Orders

## 2018-09-15 NOTE — Telephone Encounter (Signed)
Pt scheduled for 10/09/18. Pt would like to see if a partial refill can be sent in to last until that appt as he is out of medication.

## 2018-09-15 NOTE — Telephone Encounter (Signed)
Per office policy sent 30 day to local pharmacy until appt.../lmb  

## 2018-10-09 ENCOUNTER — Ambulatory Visit (INDEPENDENT_AMBULATORY_CARE_PROVIDER_SITE_OTHER): Payer: BLUE CROSS/BLUE SHIELD | Admitting: Internal Medicine

## 2018-10-09 ENCOUNTER — Other Ambulatory Visit (INDEPENDENT_AMBULATORY_CARE_PROVIDER_SITE_OTHER): Payer: BLUE CROSS/BLUE SHIELD

## 2018-10-09 ENCOUNTER — Encounter: Payer: Self-pay | Admitting: Internal Medicine

## 2018-10-09 ENCOUNTER — Other Ambulatory Visit: Payer: Self-pay | Admitting: Internal Medicine

## 2018-10-09 VITALS — BP 140/80 | HR 82 | Temp 98.0°F | Ht 66.0 in | Wt 211.0 lb

## 2018-10-09 DIAGNOSIS — R7309 Other abnormal glucose: Secondary | ICD-10-CM | POA: Diagnosis not present

## 2018-10-09 DIAGNOSIS — Z0000 Encounter for general adult medical examination without abnormal findings: Secondary | ICD-10-CM | POA: Diagnosis not present

## 2018-10-09 DIAGNOSIS — I10 Essential (primary) hypertension: Secondary | ICD-10-CM

## 2018-10-09 DIAGNOSIS — E785 Hyperlipidemia, unspecified: Secondary | ICD-10-CM

## 2018-10-09 DIAGNOSIS — L219 Seborrheic dermatitis, unspecified: Secondary | ICD-10-CM | POA: Insufficient documentation

## 2018-10-09 DIAGNOSIS — J449 Chronic obstructive pulmonary disease, unspecified: Secondary | ICD-10-CM

## 2018-10-09 DIAGNOSIS — E781 Pure hyperglyceridemia: Secondary | ICD-10-CM

## 2018-10-09 LAB — CBC WITH DIFFERENTIAL/PLATELET
BASOS ABS: 0.1 10*3/uL (ref 0.0–0.1)
Basophils Relative: 0.9 % (ref 0.0–3.0)
EOS ABS: 0.1 10*3/uL (ref 0.0–0.7)
Eosinophils Relative: 1.3 % (ref 0.0–5.0)
HEMATOCRIT: 43.2 % (ref 39.0–52.0)
HEMOGLOBIN: 15.3 g/dL (ref 13.0–17.0)
LYMPHS PCT: 20.8 % (ref 12.0–46.0)
Lymphs Abs: 1.4 10*3/uL (ref 0.7–4.0)
MCHC: 35.3 g/dL (ref 30.0–36.0)
MCV: 96.7 fl (ref 78.0–100.0)
Monocytes Absolute: 0.5 10*3/uL (ref 0.1–1.0)
Monocytes Relative: 7.4 % (ref 3.0–12.0)
NEUTROS ABS: 4.6 10*3/uL (ref 1.4–7.7)
Neutrophils Relative %: 69.6 % (ref 43.0–77.0)
PLATELETS: 262 10*3/uL (ref 150.0–400.0)
RBC: 4.47 Mil/uL (ref 4.22–5.81)
RDW: 12.6 % (ref 11.5–15.5)
WBC: 6.6 10*3/uL (ref 4.0–10.5)

## 2018-10-09 LAB — LIPID PANEL
CHOL/HDL RATIO: 4
Cholesterol: 205 mg/dL — ABNORMAL HIGH (ref 0–200)
HDL: 55.9 mg/dL (ref >39.00)
NONHDL: 149.45
TRIGLYCERIDES: 234 mg/dL — AB (ref 0.0–149.0)
VLDL: 46.8 mg/dL — AB (ref 0.0–40.0)

## 2018-10-09 LAB — COMPREHENSIVE METABOLIC PANEL
ALT: 41 U/L (ref 0–53)
AST: 37 U/L (ref 0–37)
Albumin: 4.5 g/dL (ref 3.5–5.2)
Alkaline Phosphatase: 81 U/L (ref 39–117)
BILIRUBIN TOTAL: 0.7 mg/dL (ref 0.2–1.2)
BUN: 12 mg/dL (ref 6–23)
CALCIUM: 10.1 mg/dL (ref 8.4–10.5)
CO2: 30 meq/L (ref 19–32)
Chloride: 99 mEq/L (ref 96–112)
Creatinine, Ser: 0.91 mg/dL (ref 0.40–1.50)
GFR: 89.82 mL/min (ref >60.00)
Glucose, Bld: 95 mg/dL (ref 70–99)
Potassium: 4.2 mEq/L (ref 3.5–5.1)
Sodium: 137 mEq/L (ref 135–145)
Total Protein: 7.2 g/dL (ref 6.0–8.3)

## 2018-10-09 LAB — TSH: TSH: 2.42 u[IU]/mL (ref 0.35–4.50)

## 2018-10-09 LAB — PSA: PSA: 0.31 ng/mL (ref 0.10–4.00)

## 2018-10-09 LAB — HEMOGLOBIN A1C: HEMOGLOBIN A1C: 5 % (ref 4.6–6.5)

## 2018-10-09 LAB — LDL CHOLESTEROL, DIRECT: Direct LDL: 117 mg/dL

## 2018-10-09 MED ORDER — ACLIDINIUM BROMIDE 400 MCG/ACT IN AEPB: 1.0000 | INHALATION_SPRAY | Freq: Two times a day (BID) | RESPIRATORY_TRACT | 3 refills | Status: DC

## 2018-10-09 MED ORDER — ITRACONAZOLE 200 MG PO TABS: 1.0000 | ORAL_TABLET | Freq: Every day | ORAL | 0 refills | Status: DC

## 2018-10-09 NOTE — Patient Instructions (Signed)
Seborrheic Dermatitis, Adult Seborrheic dermatitis is a skin disease that causes red, scaly patches. It usually occurs on the scalp, and it is often called dandruff. The patches may appear on other parts of the body. Skin patches tend to appear where there are many oil glands in the skin. Areas of the body that are commonly affected include:  Scalp.  Skin folds of the body.  Ears.  Eyebrows.  Neck.  Face.  Armpits.  The bearded area of men's faces.  The condition may come and go for no known reason, and it is often long-lasting (chronic). What are the causes? The cause of this condition is not known. What increases the risk? This condition is more likely to develop in people who:  Have certain conditions, such as: ? HIV (human immunodeficiency virus). ? AIDS (acquired immunodeficiency syndrome). ? Parkinson disease. ? Mood disorders, such as depression.  Are 40-60 years old.  What are the signs or symptoms? Symptoms of this condition include:  Thick scales on the scalp.  Redness on the face or in the armpits.  Skin that is flaky. The flakes may be white or yellow.  Skin that seems oily or dry but is not helped with moisturizers.  Itching or burning in the affected areas.  How is this diagnosed? This condition is diagnosed with a medical history and physical exam. A sample of your skin may be tested (skin biopsy). You may need to see a skin specialist (dermatologist). How is this treated? There is no cure for this condition, but treatment can help to manage the symptoms. You may get treatment to remove scales, lower the risk of skin infection, and reduce swelling or itching. Treatment may include:  Creams that reduce swelling and irritation (steroids).  Creams that reduce skin yeast.  Medicated shampoo, soaps, moisturizing creams, or ointments.  Medicated moisturizing creams or ointments.  Follow these instructions at home:  Apply over-the-counter and  prescription medicines only as told by your health care provider.  Use any medicated shampoo, soaps, skin creams, or ointments only as told by your health care provider.  Keep all follow-up visits as told by your health care provider. This is important. Contact a health care provider if:  Your symptoms do not improve with treatment.  Your symptoms get worse.  You have new symptoms. This information is not intended to replace advice given to you by your health care provider. Make sure you discuss any questions you have with your health care provider. Document Released: 11/15/2005 Document Revised: 06/04/2016 Document Reviewed: 03/04/2016 Elsevier Interactive Patient Education  2018 Elsevier Inc.  

## 2018-10-09 NOTE — Progress Notes (Signed)
Subjective:  Patient ID: Kenneth Offer Sr., male    DOB: Jun 29, 1956  Age: 62 y.o. MRN: [id number]  CC: Rash; Annual Exam; Hypertension; and Hyperlipidemia   HPI Kenneth [facility]aiser [facility] Sr. presents for a CPX.  He tells me his blood pressure has been well controlled.  He is very active and denies any recent episodes of CP, DOE, palpitations, edema, or fatigue.  He also denies any recent episodes of cough, wheezing, or shortness of breath.  He is not currently taking the statin.  He complains of chronic recurrent rash on his face.  He gets red, scaly, burning bumps around his nose, eyebrows, and mustache.  He is currently treating it (unsuccessfully) with an over-the-counter topical product.  Outpatient Medications Prior to Visit  Medication Sig Dispense Refill  . celecoxib (CELEBREX) 200 MG capsule TAKE ONE CAPSULE BY MOUTH EVERY DAY 90 capsule 3  . gabapentin (NEURONTIN) 100 MG capsule TAKE 1 CAPSULE BY MOUTH THREE TIMES A DAY 270 capsule 1  . losartan-hydrochlorothiazide (HYZAAR) 100-12.5 MG tablet TAKE 1 TABLET BY MOUTH EVERY DAY 90 tablet 0  . VASCEPA 1 g CAPS TAKE 2 CAPSULES BY MOUTH 2 TIMES DAILY 360 capsule 1  . Aclidinium Bromide (TUDORZA PRESSAIR) 400 MCG/ACT AEPB Inhale 1 puff into the lungs 2 (two) times daily. 3 each 3  . omeprazole (PRILOSEC) 20 MG capsule Take 20 mg by mouth daily.    . pravastatin (PRAVACHOL) 80 MG tablet Take 1 tablet (80 mg total) by mouth every evening. Must keep appt for future refills 30 tablet 0  . losartan-hydrochlorothiazide (HYZAAR) 100-12.5 MG tablet TAKE 1 TABLET BY MOUTH DAILY. 90 tablet 0  . traMADol (ULTRAM) 50 MG tablet TAKE 1 TABLET BY MOUTH EVERY 8 HOURS AS NEEDED 90 tablet 0  . TUDORZA PRESSAIR 400 MCG/ACT AEPB INHALE 1 PUFF INTO THE LUNGS 2 TIMES DAILY 1 each 11   No facility-administered medications prior to visit.     ROS Review of Systems  Constitutional: Negative.  Negative for diaphoresis, fatigue and unexpected weight change.  HENT: Negative.    Eyes: Negative for visual disturbance.  Respiratory: Negative for cough, chest tightness, shortness of breath and wheezing.   Cardiovascular: Negative for chest pain, palpitations and leg swelling.  Gastrointestinal: Negative.  Negative for abdominal pain, constipation, diarrhea, nausea and vomiting.  Genitourinary: Negative.  Negative for difficulty urinating.  Musculoskeletal: Negative.  Negative for arthralgias, back pain, myalgias and neck pain.  Skin: Negative.  Negative for color change, pallor and rash.  Neurological: Negative.  Negative for dizziness, weakness, light-headedness, numbness and headaches.  Hematological: Negative for adenopathy. Does not bruise/bleed easily.  Psychiatric/Behavioral: Negative.     Objective:  BP 140/80 (BP Location: Left Arm, Patient Position: Sitting, Cuff Size: Normal)   Pulse 82   Temp 98 F (36.7 C) (Oral)   Ht 5' 6"  (1.676 m)   Wt 211 lb (95.7 kg)   SpO2 92%   BMI 34.06 kg/m   BP Readings from Last 3 Encounters:  10/09/18 140/80  09/20/17 138/74  02/14/17 130/80    Wt Readings from Last 3 Encounters:  10/09/18 211 lb (95.7 kg)  09/20/17 213 lb (96.6 kg)  02/14/17 199 lb 12 oz (90.6 kg)    Physical Exam  Constitutional: He is oriented to person, place, and time. No distress.  HENT:  Mouth/Throat: Oropharynx is clear and moist. No oropharyngeal exudate.  Eyes: Conjunctivae are normal. No scleral icterus.  Neck: Normal range of motion. Neck supple. No  JVD present. No thyromegaly present.  Cardiovascular: Normal rate, regular rhythm and normal heart sounds. Exam reveals no gallop.  No murmur heard. EKG ---  Sinus  Rhythm  WITHIN NORMAL LIMITS  Pulmonary/Chest: Effort normal and breath sounds normal. No respiratory distress. He has no wheezes. He has no rhonchi. He has no rales.  Abdominal: Soft. Bowel sounds are normal. He exhibits no mass. There is no tenderness. There is no rebound. Hernia confirmed negative in the right  inguinal area and confirmed negative in the left inguinal area.  Genitourinary: Rectum normal, prostate normal, testes normal and penis normal. Rectal exam shows no external hemorrhoid, no internal hemorrhoid, no fissure, no mass, no tenderness, anal tone normal and guaiac negative stool. Prostate is not enlarged and not tender. Right testis shows no mass, no swelling and no tenderness. Left testis shows no mass, no swelling and no tenderness. Circumcised. No penile erythema or penile tenderness. No discharge found.  Musculoskeletal: Normal range of motion. He exhibits no edema, tenderness or deformity.  Lymphadenopathy:    He has no cervical adenopathy. No inguinal adenopathy noted on the right or left side.  Neurological: He is alert and oriented to person, place, and time.  Skin: Skin is warm and dry. No rash noted. He is not diaphoretic. No erythema.  There are erythematous, scaly papules around the medial aspect of both eyebrows, over the nasolabial folds, and coalesced under his mustache.  Vitals reviewed.   Lab Results  Component Value Date   WBC 6.6 10/09/2018   HGB 15.3 10/09/2018   HCT 43.2 10/09/2018   PLT 262.0 10/09/2018   GLUCOSE 95 10/09/2018   CHOL 205 (H) 10/09/2018   TRIG 234.0 (H) 10/09/2018   HDL 55.90 10/09/2018   LDLDIRECT 117.0 10/09/2018   LDLCALC 122 (H) 02/14/2017   ALT 41 10/09/2018   AST 37 10/09/2018   NA 137 10/09/2018   K 4.2 10/09/2018   CL 99 10/09/2018   CREATININE 0.91 10/09/2018   BUN 12 10/09/2018   CO2 30 10/09/2018   TSH 2.42 10/09/2018   PSA 0.31 10/09/2018   INR 0.94 03/26/2015   HGBA1C 5.0 10/09/2018    No results found.  Assessment & Plan:   Kenneth Singleton was seen today for rash, annual exam, hypertension and hyperlipidemia.  Diagnoses and all orders for this visit:  Essential hypertension- His blood pressure is adequately well controlled.  Electrolytes and renal function are normal.  EKG is negative for LVH or ischemia.  Will continue  the current combination of an ARB and thiazide diuretic. -     CBC with Differential/Platelet; Future -     Comprehensive metabolic panel; Future -     TSH; Future -     EKG 12-Lead  Routine general medical examination at a health care facility- Exam completed, labs reviewed, he refused a flu vaccine today, colon cancer screening is up-to-date, patient education material was given. -     Lipid panel; Future -     PSA; Future  Other abnormal glucose- His A1c is down to 5.0%.  His blood sugars are normal now. -     Comprehensive metabolic panel; Future -     Hemoglobin A1c; Future  Hypertriglyceridemia, essential- His triglycerides remain mildly elevated.  He drinks 12 beers a day.  I think this is the main culprit.  I have asked him to limit his beer intake to less than 3 a day and to reduce his intake of fats and carbohydrates.  The triglyceride  elevation is not high enough at this time to require treatment.  Dyslipidemia, goal LDL below 130- He has not achieved his LDL goal and has a slightly elevated ASCVD risk score so I have asked him to restart the statin for CV risk reduction. -     pravastatin (PRAVACHOL) 80 MG tablet; Take 1 tablet (80 mg total) by mouth every evening. Must keep appt for future refills  Seborrheic dermatitis- I will treat this with a 7-day course of itraconazole.  If he has recurrent episodes then will continue a dose of itraconazole 200 mg every 2 weeks. -     Itraconazole 200 MG TABS; Take 1 tablet by mouth daily for 7 days.  COPD (chronic obstructive pulmonary disease) with chronic bronchitis ([place])- His symptoms are well controlled with the LAMA inhaler. -     Aclidinium Bromide (TUDORZA PRESSAIR) 400 MCG/ACT AEPB; Inhale 1 puff into the lungs 2 (two) times daily.   I have discontinued Lamonte Hartt. Kenneth Sr.'s omeprazole, traMADol, and TUDORZA PRESSAIR. I am also having him start on Itraconazole. Additionally, I am having him maintain his celecoxib, VASCEPA,  losartan-hydrochlorothiazide, gabapentin, Aclidinium Bromide, and pravastatin.  Meds ordered this encounter  Medications  . Itraconazole 200 MG TABS    Sig: Take 1 tablet by mouth daily for 7 days.    Dispense:  7 tablet    Refill:  0  . Aclidinium Bromide (TUDORZA PRESSAIR) 400 MCG/ACT AEPB    Sig: Inhale 1 puff into the lungs 2 (two) times daily.    Dispense:  3 each    Refill:  3  . pravastatin (PRAVACHOL) 80 MG tablet    Sig: Take 1 tablet (80 mg total) by mouth every evening. Must keep appt for future refills    Dispense:  90 tablet    Refill:  1     Follow-up: Return in about 4 weeks (around 11/06/2018).  Scarlette Calico, MD

## 2018-10-10 ENCOUNTER — Other Ambulatory Visit: Payer: Self-pay | Admitting: Internal Medicine

## 2018-10-10 ENCOUNTER — Encounter: Payer: Self-pay | Admitting: Internal Medicine

## 2018-10-10 DIAGNOSIS — I10 Essential (primary) hypertension: Secondary | ICD-10-CM

## 2018-10-10 MED ORDER — PRAVASTATIN SODIUM 80 MG PO TABS: 80.0000 mg | ORAL_TABLET | Freq: Every evening | ORAL | 1 refills | Status: DC

## 2018-10-11 ENCOUNTER — Other Ambulatory Visit: Payer: Self-pay | Admitting: Internal Medicine

## 2018-10-11 DIAGNOSIS — L219 Seborrheic dermatitis, unspecified: Secondary | ICD-10-CM

## 2018-10-11 MED ORDER — ITRACONAZOLE 100 MG PO CAPS: 200.0000 mg | ORAL_CAPSULE | Freq: Every day | ORAL | 0 refills | Status: DC

## 2018-10-16 ENCOUNTER — Other Ambulatory Visit: Payer: Self-pay | Admitting: Internal Medicine

## 2018-10-16 DIAGNOSIS — E785 Hyperlipidemia, unspecified: Secondary | ICD-10-CM

## 2018-10-16 MED ORDER — PRAVASTATIN SODIUM 80 MG PO TABS: 80.0000 mg | ORAL_TABLET | Freq: Every evening | ORAL | 1 refills | Status: DC

## 2018-12-02 ENCOUNTER — Other Ambulatory Visit: Payer: Self-pay | Admitting: Internal Medicine

## 2018-12-02 DIAGNOSIS — E781 Pure hyperglyceridemia: Secondary | ICD-10-CM

## 2018-12-13 ENCOUNTER — Other Ambulatory Visit: Payer: Self-pay | Admitting: Internal Medicine

## 2018-12-13 ENCOUNTER — Telehealth: Payer: Self-pay | Admitting: Internal Medicine

## 2018-12-13 DIAGNOSIS — L219 Seborrheic dermatitis, unspecified: Secondary | ICD-10-CM

## 2018-12-13 MED ORDER — ITRACONAZOLE 100 MG PO CAPS: 200.0000 mg | ORAL_CAPSULE | Freq: Every day | ORAL | 0 refills | Status: AC

## 2018-12-13 NOTE — Telephone Encounter (Signed)
Pt spouse is requesting a refill of the itraconazole. She stated that the medication worked while he was taking it and now that pt has completed and is no longer taking the medication, the sx of seborrheic dermatitis have come back.   Requesting the extended treatment of itraconazole 200 mg every 2 weeks.

## 2018-12-13 NOTE — Telephone Encounter (Signed)
Copied from [place] [id number]. Topic: Quick Communication - See Telephone Encounter >> Dec 13, 2018  1:01 PM Bea Graff, NT wrote: CRM for notification. See Telephone encounter for: 12/13/18. Pts wife states that the itraconazole (SPORANOX) 100 MG capsule worked for the pt but in the last 2 weeks the flaking came back in the last few weeks and she wants to see what medication permanently he can go on to help.

## 2018-12-15 ENCOUNTER — Telehealth: Payer: Self-pay

## 2018-12-15 DIAGNOSIS — G8929 Other chronic pain: Secondary | ICD-10-CM

## 2018-12-15 DIAGNOSIS — M549 Dorsalgia, unspecified: Principal | ICD-10-CM

## 2018-12-15 DIAGNOSIS — M150 Primary generalized (osteo)arthritis: Secondary | ICD-10-CM

## 2018-12-15 DIAGNOSIS — M159 Polyosteoarthritis, unspecified: Secondary | ICD-10-CM

## 2018-12-15 MED ORDER — CELECOXIB 200 MG PO CAPS: 200.0000 mg | ORAL_CAPSULE | Freq: Every day | ORAL | 1 refills | Status: DC

## 2018-12-15 NOTE — Telephone Encounter (Signed)
Key: [id number]  PA approved. Pt informed of same.

## 2019-01-03 ENCOUNTER — Telehealth: Payer: Self-pay | Admitting: Internal Medicine

## 2019-01-03 DIAGNOSIS — E781 Pure hyperglyceridemia: Secondary | ICD-10-CM

## 2019-01-03 DIAGNOSIS — I10 Essential (primary) hypertension: Secondary | ICD-10-CM

## 2019-01-03 DIAGNOSIS — M549 Dorsalgia, unspecified: Principal | ICD-10-CM

## 2019-01-03 DIAGNOSIS — E785 Hyperlipidemia, unspecified: Secondary | ICD-10-CM

## 2019-01-03 DIAGNOSIS — G8929 Other chronic pain: Secondary | ICD-10-CM

## 2019-01-03 DIAGNOSIS — M150 Primary generalized (osteo)arthritis: Secondary | ICD-10-CM

## 2019-01-03 DIAGNOSIS — M159 Polyosteoarthritis, unspecified: Secondary | ICD-10-CM

## 2019-01-03 NOTE — Telephone Encounter (Signed)
Spoke to pt spouse Verdis Frederickson) and informed that the medications do have refills.

## 2019-01-03 NOTE — Telephone Encounter (Signed)
Copied from [place] [phone]. Topic: Quick Communication - See Telephone Encounter >> Jan 03, 2019  2:29 PM Rutherford Nail, NT wrote: CRM for notification. See Telephone encounter for: 01/03/19. Patient's wife calling and states that the pharmacy is saying that they do not have any medications for the patient. States that the patient had a physical and everything was supposed to be sent to the pharmacy. Med list shows refills. Please advise.

## 2019-02-03 ENCOUNTER — Other Ambulatory Visit: Payer: Self-pay | Admitting: Internal Medicine

## 2019-02-03 DIAGNOSIS — J449 Chronic obstructive pulmonary disease, unspecified: Secondary | ICD-10-CM

## 2019-02-12 ENCOUNTER — Telehealth: Payer: Self-pay

## 2019-02-12 NOTE — Telephone Encounter (Signed)
Refill request for Tudorza sent on 02/04/2019 by pharmacy. Caprice Renshaw was cancelled and Spiriva was sent in instead.   Spouse is requesting information as to why.   Please advise so I may communicate the reason.

## 2019-02-12 NOTE — Telephone Encounter (Signed)
Tried to call but was not able to hear after the phone was answered.

## 2019-02-12 NOTE — Telephone Encounter (Signed)
Patient returned call to [facility].Marland Kitchen

## 2019-02-12 NOTE — Telephone Encounter (Signed)
According to our software Kenneth Singleton is not approved by his insurance company

## 2019-02-12 NOTE — Telephone Encounter (Signed)
LVM for pt to call back as soon as possible.   

## 2019-02-12 NOTE — Telephone Encounter (Signed)
Patient's spouse called.  She held for 10 minutes before disconnecting the call.  Please follow up.

## 2019-02-12 NOTE — Telephone Encounter (Signed)
Spoke to pt spouse and informed the reason for the change. Also requested that they let us know if there are any issue with the new medication and I will do a prior authorization for the [place].

## 2019-02-12 NOTE — Telephone Encounter (Signed)
Copied from [place] [phone]. Topic: General - Other >> Feb 09, 2019  3:02 PM Carolyn Stare wrote:  Wife called about the side effects of Tiotropium Bromide Monohydrate (SPIRIVA RESPIMAT) 2.5 MCG/ACT AERS  Is asking why pt was taken off [place]

## 2019-03-05 ENCOUNTER — Other Ambulatory Visit: Payer: Self-pay | Admitting: Internal Medicine

## 2019-03-29 ENCOUNTER — Other Ambulatory Visit: Payer: Self-pay | Admitting: Internal Medicine

## 2019-05-31 ENCOUNTER — Other Ambulatory Visit: Payer: Self-pay | Admitting: Internal Medicine

## 2019-05-31 DIAGNOSIS — E781 Pure hyperglyceridemia: Secondary | ICD-10-CM

## 2019-06-11 ENCOUNTER — Other Ambulatory Visit: Payer: Self-pay | Admitting: Internal Medicine

## 2019-06-11 DIAGNOSIS — M159 Polyosteoarthritis, unspecified: Secondary | ICD-10-CM

## 2019-06-11 DIAGNOSIS — G8929 Other chronic pain: Secondary | ICD-10-CM

## 2019-06-22 ENCOUNTER — Other Ambulatory Visit: Payer: Self-pay | Admitting: Internal Medicine

## 2019-07-01 ENCOUNTER — Other Ambulatory Visit: Payer: Self-pay | Admitting: Internal Medicine

## 2019-07-01 DIAGNOSIS — E785 Hyperlipidemia, unspecified: Secondary | ICD-10-CM

## 2019-07-02 ENCOUNTER — Encounter: Payer: Self-pay | Admitting: Internal Medicine

## 2019-07-02 ENCOUNTER — Ambulatory Visit (INDEPENDENT_AMBULATORY_CARE_PROVIDER_SITE_OTHER): Payer: BC Managed Care – PPO | Admitting: Internal Medicine

## 2019-07-02 ENCOUNTER — Other Ambulatory Visit: Payer: Self-pay

## 2019-07-02 ENCOUNTER — Other Ambulatory Visit (INDEPENDENT_AMBULATORY_CARE_PROVIDER_SITE_OTHER): Payer: BC Managed Care – PPO

## 2019-07-02 VITALS — BP 138/82 | HR 75 | Temp 98.5°F | Resp 16 | Ht 66.0 in | Wt 199.0 lb

## 2019-07-02 DIAGNOSIS — I10 Essential (primary) hypertension: Secondary | ICD-10-CM | POA: Diagnosis not present

## 2019-07-02 DIAGNOSIS — E781 Pure hyperglyceridemia: Secondary | ICD-10-CM | POA: Diagnosis not present

## 2019-07-02 DIAGNOSIS — R945 Abnormal results of liver function studies: Secondary | ICD-10-CM | POA: Diagnosis not present

## 2019-07-02 DIAGNOSIS — Z0000 Encounter for general adult medical examination without abnormal findings: Secondary | ICD-10-CM

## 2019-07-02 DIAGNOSIS — R7989 Other specified abnormal findings of blood chemistry: Secondary | ICD-10-CM

## 2019-07-02 DIAGNOSIS — K7581 Nonalcoholic steatohepatitis (NASH): Secondary | ICD-10-CM | POA: Insufficient documentation

## 2019-07-02 LAB — HEPATIC FUNCTION PANEL
ALT: 48 U/L (ref 0–53)
AST: 50 U/L — ABNORMAL HIGH (ref 0–37)
Albumin: 4.3 g/dL (ref 3.5–5.2)
Alkaline Phosphatase: 65 U/L (ref 39–117)
Bilirubin, Direct: 0.1 mg/dL (ref 0.0–0.3)
Total Bilirubin: 0.4 mg/dL (ref 0.2–1.2)
Total Protein: 7 g/dL (ref 6.0–8.3)

## 2019-07-02 LAB — LIPID PANEL
Cholesterol: 202 mg/dL — ABNORMAL HIGH (ref 0–200)
HDL: 48 mg/dL (ref >39.00)
NonHDL: 153.74
Total CHOL/HDL Ratio: 4
Triglycerides: 227 mg/dL — ABNORMAL HIGH (ref 0.0–149.0)
VLDL: 45.4 mg/dL — ABNORMAL HIGH (ref 0.0–40.0)

## 2019-07-02 LAB — BASIC METABOLIC PANEL
BUN: 15 mg/dL (ref 6–23)
CO2: 29 mEq/L (ref 19–32)
Calcium: 9.8 mg/dL (ref 8.4–10.5)
Chloride: 102 mEq/L (ref 96–112)
Creatinine, Ser: 0.94 mg/dL (ref 0.40–1.50)
GFR: 81.21 mL/min (ref >60.00)
Glucose, Bld: 103 mg/dL — ABNORMAL HIGH (ref 70–99)
Potassium: 4.1 mEq/L (ref 3.5–5.1)
Sodium: 139 mEq/L (ref 135–145)

## 2019-07-02 LAB — LDL CHOLESTEROL, DIRECT: Direct LDL: 105 mg/dL

## 2019-07-02 MED ORDER — LOSARTAN POTASSIUM 100 MG PO TABS: 100.0000 mg | ORAL_TABLET | Freq: Every day | ORAL | 1 refills | Status: DC

## 2019-07-02 MED ORDER — INDAPAMIDE 1.25 MG PO TABS: 1.2500 mg | ORAL_TABLET | Freq: Every day | ORAL | 1 refills | Status: DC

## 2019-07-02 MED ORDER — VASCEPA 1 G PO CAPS: 2.0000 | ORAL_CAPSULE | Freq: Two times a day (BID) | ORAL | 1 refills | Status: DC

## 2019-07-02 NOTE — Patient Instructions (Signed)

## 2019-07-02 NOTE — Progress Notes (Signed)
Subjective:  Patient ID: Kenneth Offer Sr., male    DOB: 10-05-1956  Age: 63 y.o. MRN: [id number]  CC: Annual Exam, Hypertension, and Hyperlipidemia   HPI Kenneth [facility]u [facility] Sr. presents for a CPX.  He feels well and offers no complaints.  He has been able to lose weight with lifestyle modifications.  He denies any recent episodes of CP or DOE.  Outpatient Medications Prior to Visit  Medication Sig Dispense Refill  . celecoxib (CELEBREX) 200 MG capsule TAKE 1 CAPSULE BY MOUTH EVERY DAY 90 capsule 1  . gabapentin (NEURONTIN) 100 MG capsule TAKE 1 CAPSULE BY MOUTH THREE TIMES A DAY 270 capsule 1  . Multiple Vitamins-Minerals (CENTRUM SILVER PO) Take by mouth.    . pravastatin (PRAVACHOL) 80 MG tablet TAKE 1 TABLET BY MOUTH EVERY EVENING MUST KEEP APPT FOR REFILLS 90 tablet 1  . Tiotropium Bromide Monohydrate (SPIRIVA RESPIMAT) 2.5 MCG/ACT AERS Inhale 2 puffs into the lungs daily. 12 g 1  . hydrochlorothiazide (HYDRODIURIL) 12.5 MG tablet TAKE 1 TABLET BY MOUTH EVERY DAY 90 tablet 0  . losartan (COZAAR) 100 MG tablet TAKE 1 TABLET BY MOUTH EVERY DAY 90 tablet 0  . losartan-hydrochlorothiazide (HYZAAR) 100-12.5 MG tablet TAKE 1 TABLET BY MOUTH EVERY DAY 90 tablet 1  . VASCEPA 1 g CAPS TAKE 2 CAPSULES BY MOUTH TWICE A DAY 360 capsule 1   No facility-administered medications prior to visit.     ROS Review of Systems  Constitutional: Negative.  Negative for chills, diaphoresis, fatigue and fever.  HENT: Negative.   Eyes: Negative for visual disturbance.  Respiratory: Negative for cough, chest tightness, shortness of breath and wheezing.   Cardiovascular: Negative for chest pain, palpitations and leg swelling.  Gastrointestinal: Negative for abdominal pain, constipation, diarrhea, nausea and vomiting.  Endocrine: Negative.   Genitourinary: Negative.  Negative for difficulty urinating, hematuria, penile swelling, scrotal swelling, testicular pain and urgency.  Musculoskeletal: Negative.  Negative  for arthralgias and myalgias.  Skin: Negative.  Negative for color change and pallor.  Neurological: Negative.  Negative for dizziness, weakness and light-headedness.  Hematological: Negative for adenopathy. Does not bruise/bleed easily.  Psychiatric/Behavioral: Negative.     Objective:  BP 138/82 (BP Location: Left Arm, Patient Position: Sitting, Cuff Size: Large)   Pulse 75   Temp 98.5 F (36.9 C) (Oral)   Resp 16   Ht 5' 6"  (1.676 m)   Wt 199 lb (90.3 kg)   SpO2 96%   BMI 32.12 kg/m   BP Readings from Last 3 Encounters:  07/02/19 138/82  10/09/18 140/80  09/20/17 138/74    Wt Readings from Last 3 Encounters:  07/02/19 199 lb (90.3 kg)  10/09/18 211 lb (95.7 kg)  09/20/17 213 lb (96.6 kg)    Physical Exam Vitals signs reviewed. Exam conducted with a chaperone present.  Constitutional:      Appearance: He is obese. He is not ill-appearing or diaphoretic.  HENT:     Nose: Nose normal.     Mouth/Throat:     Mouth: Mucous membranes are moist.     Pharynx: No posterior oropharyngeal erythema.  Eyes:     General: No scleral icterus.    Conjunctiva/sclera: Conjunctivae normal.  Neck:     Musculoskeletal: Normal range of motion. No neck rigidity or muscular tenderness.  Cardiovascular:     Rate and Rhythm: Normal rate and regular rhythm.     Heart sounds: No murmur.  Pulmonary:     Effort: Pulmonary effort is  normal.     Breath sounds: No stridor. No wheezing, rhonchi or rales.  Abdominal:     General: Abdomen is protuberant. Bowel sounds are normal. There is no distension.     Palpations: Abdomen is soft. There is no hepatomegaly or splenomegaly.     Tenderness: There is no abdominal tenderness.     Hernia: There is no hernia in the right inguinal area.  Genitourinary:    Penis: Normal and circumcised. No discharge, swelling or lesions.      Scrotum/Testes: Normal.        Right: Mass, tenderness or swelling not present.        Left: Mass, tenderness or swelling  not present.     Epididymis:     Right: Normal. Not inflamed or enlarged. No mass.     Left: Normal. Not inflamed or enlarged. No mass.     Prostate: Normal. Not enlarged, not tender and no nodules present.     Rectum: Normal. Guaiac result negative. No mass, tenderness, anal fissure, external hemorrhoid or internal hemorrhoid. Normal anal tone.  Musculoskeletal: Normal range of motion.     Right lower leg: No edema.     Left lower leg: No edema.  Lymphadenopathy:     Cervical: No cervical adenopathy.     Lower Body: No right inguinal adenopathy. No left inguinal adenopathy.  Skin:    General: Skin is warm and dry.     Coloration: Skin is not pale.  Neurological:     General: No focal deficit present.     Mental Status: He is alert and oriented to person, place, and time. Mental status is at baseline.  Psychiatric:        Mood and Affect: Mood normal.        Behavior: Behavior normal.     Lab Results  Component Value Date   WBC 6.6 10/09/2018   HGB 15.3 10/09/2018   HCT 43.2 10/09/2018   PLT 262.0 10/09/2018   GLUCOSE 103 (H) 07/02/2019   CHOL 202 (H) 07/02/2019   TRIG 227.0 (H) 07/02/2019   HDL 48.00 07/02/2019   LDLDIRECT 105.0 07/02/2019   LDLCALC 122 (H) 02/14/2017   ALT 48 07/02/2019   AST 50 (H) 07/02/2019   NA 139 07/02/2019   K 4.1 07/02/2019   CL 102 07/02/2019   CREATININE 0.94 07/02/2019   BUN 15 07/02/2019   CO2 29 07/02/2019   TSH 2.42 10/09/2018   PSA 0.31 10/09/2018   INR 0.94 03/26/2015   HGBA1C 5.0 10/09/2018    No results found.  Assessment & Plan:   Kenneth Singleton was seen today for annual exam, hypertension and hyperlipidemia.  Diagnoses and all orders for this visit:  Essential hypertension- His blood pressure is not quite adequately well controlled.  I have asked him to upgrade to a more potent thiazide diuretic.  Will continue the ARB at the current dose. -     indapamide (LOZOL) 1.25 MG tablet; Take 1 tablet (1.25 mg total) by mouth daily. -      losartan (COZAAR) 100 MG tablet; Take 1 tablet (100 mg total) by mouth daily. -     Basic metabolic panel; Future  Hypertriglyceridemia, essential- His triglycerides are still too high.  I have asked him to improve his lifestyle modifications and to be compliant with the icosapent ethyl fish oil. -     Hepatic function panel; Future -     Icosapent Ethyl (VASCEPA) 1 g CAPS; Take 2 capsules (2 g  total) by mouth 2 (two) times daily.  Routine general medical examination at a health care facility- Exam completed, labs reviewed, vaccines reviewed, colon cancer screening is up-to-date, patient education material was given. -     Lipid panel; Future  Elevated LFTs- His LFTs are mildly elevated.  I have asked him to undergo an ultrasound to screen for NASH. -     US Abdomen Limited RUQ; Future   I have discontinued Kenneth Rakers. Gladman Sr.'s losartan-hydrochlorothiazide and hydrochlorothiazide. I have also changed his losartan and Vascepa. Additionally, I am having him start on indapamide. Lastly, I am having him maintain his Tiotropium Bromide Monohydrate, gabapentin, celecoxib, pravastatin, and Multiple Vitamins-Minerals (CENTRUM SILVER PO).  Meds ordered this encounter  Medications  . indapamide (LOZOL) 1.25 MG tablet    Sig: Take 1 tablet (1.25 mg total) by mouth daily.    Dispense:  90 tablet    Refill:  1  . losartan (COZAAR) 100 MG tablet    Sig: Take 1 tablet (100 mg total) by mouth daily.    Dispense:  90 tablet    Refill:  1  . Icosapent Ethyl (VASCEPA) 1 g CAPS    Sig: Take 2 capsules (2 g total) by mouth 2 (two) times daily.    Dispense:  360 capsule    Refill:  1     Follow-up: Return in about 6 months (around 01/02/2020).  Scarlette Calico, MD

## 2019-07-13 ENCOUNTER — Ambulatory Visit
Admission: RE | Admit: 2019-07-13 | Discharge: 2019-07-13 | Disposition: A | Discharge status: Home / Self Care | Payer: BC Managed Care – PPO | Source: Ambulatory Visit | Attending: Internal Medicine | Admitting: Internal Medicine

## 2019-07-13 DIAGNOSIS — R7989 Other specified abnormal findings of blood chemistry: Secondary | ICD-10-CM

## 2019-07-15 ENCOUNTER — Encounter: Payer: Self-pay | Admitting: Internal Medicine

## 2019-07-15 ENCOUNTER — Other Ambulatory Visit: Payer: Self-pay | Admitting: Internal Medicine

## 2019-07-15 DIAGNOSIS — K7581 Nonalcoholic steatohepatitis (NASH): Secondary | ICD-10-CM

## 2019-07-15 MED ORDER — PIOGLITAZONE HCL 15 MG PO TABS: 15.0000 mg | ORAL_TABLET | Freq: Every day | ORAL | 1 refills | Status: DC

## 2019-07-31 ENCOUNTER — Other Ambulatory Visit: Payer: Self-pay | Admitting: Internal Medicine

## 2019-08-09 DIAGNOSIS — H40013 Open angle with borderline findings, low risk, bilateral: Secondary | ICD-10-CM | POA: Diagnosis not present

## 2019-08-26 ENCOUNTER — Other Ambulatory Visit: Payer: Self-pay | Admitting: Internal Medicine

## 2019-10-22 ENCOUNTER — Other Ambulatory Visit: Payer: Self-pay | Admitting: Internal Medicine

## 2019-10-22 DIAGNOSIS — K7581 Nonalcoholic steatohepatitis (NASH): Secondary | ICD-10-CM

## 2019-12-01 ENCOUNTER — Other Ambulatory Visit: Payer: Self-pay | Admitting: Internal Medicine

## 2019-12-01 DIAGNOSIS — G8929 Other chronic pain: Secondary | ICD-10-CM

## 2019-12-01 DIAGNOSIS — M159 Polyosteoarthritis, unspecified: Secondary | ICD-10-CM

## 2019-12-01 DIAGNOSIS — M549 Dorsalgia, unspecified: Secondary | ICD-10-CM

## 2019-12-16 ENCOUNTER — Other Ambulatory Visit: Payer: Self-pay | Admitting: Internal Medicine

## 2019-12-16 DIAGNOSIS — I10 Essential (primary) hypertension: Secondary | ICD-10-CM

## 2019-12-30 ENCOUNTER — Other Ambulatory Visit: Payer: Self-pay | Admitting: Internal Medicine

## 2019-12-30 DIAGNOSIS — E785 Hyperlipidemia, unspecified: Secondary | ICD-10-CM

## 2020-01-29 ENCOUNTER — Other Ambulatory Visit: Payer: Self-pay | Admitting: Internal Medicine

## 2020-01-30 IMAGING — US ULTRASOUND ABDOMEN LIMITED
1 series · 14 of 25 positions shown · non-contrast
Comparison: None.

CLINICAL DATA: Elevated liver function tests.

EXAM:
ULTRASOUND ABDOMEN LIMITED RIGHT UPPER QUADRANT

[Series 1: ultrasound abdomen limited · 0.21mm/px · 14 of 34 slices shown]
[im 1/34]
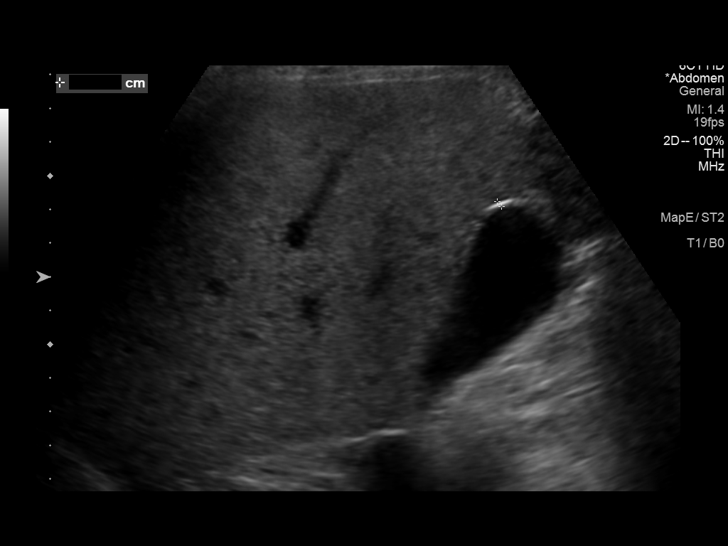
[im 3/34]
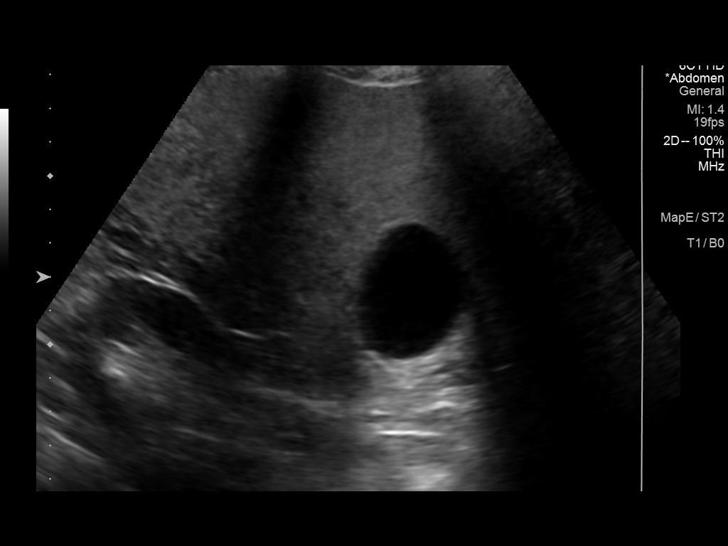
[im 6/34]
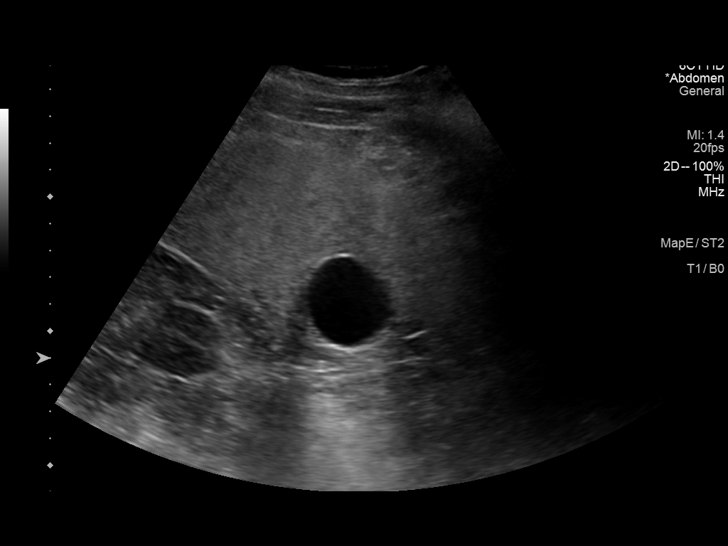
[im 9/34]
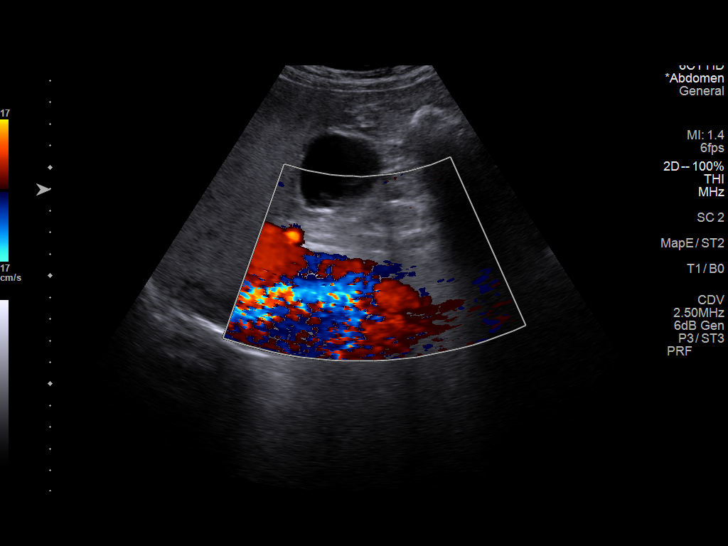
[im 12/34]
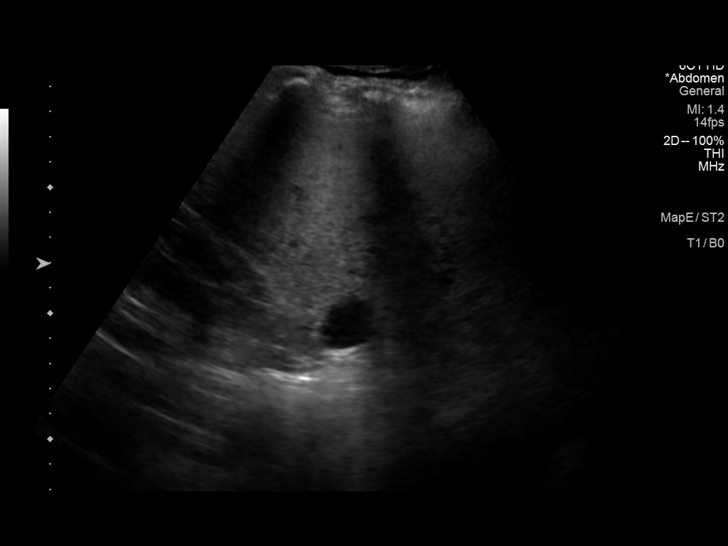
[im 13/34]
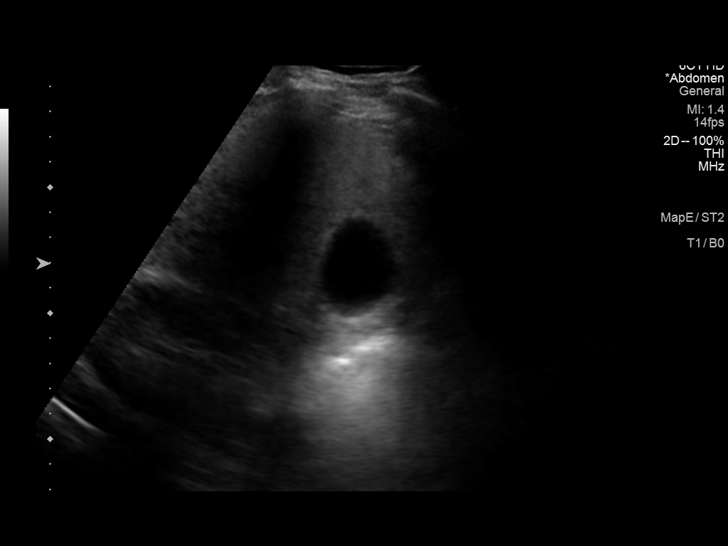
[im 16/34]
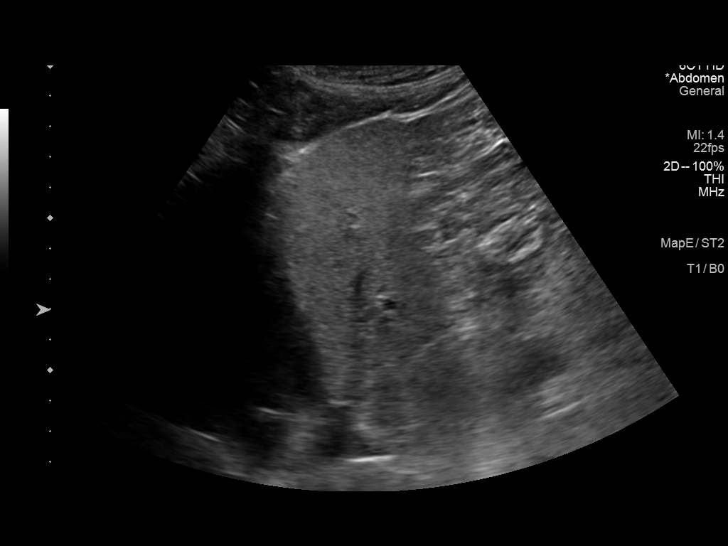
[im 18/34]
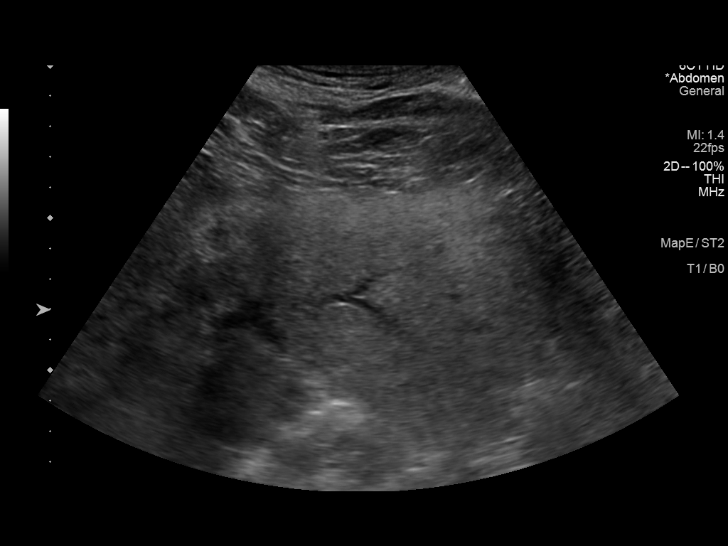
[im 21/34]
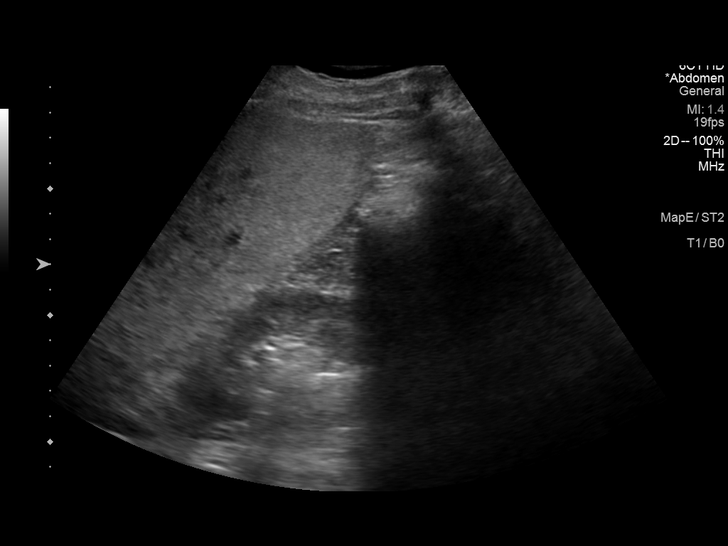
[im 23/34]
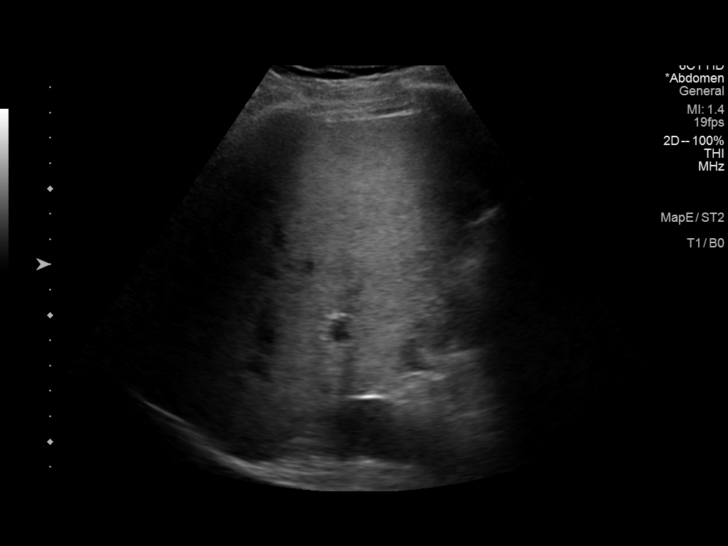
[im 25/34]
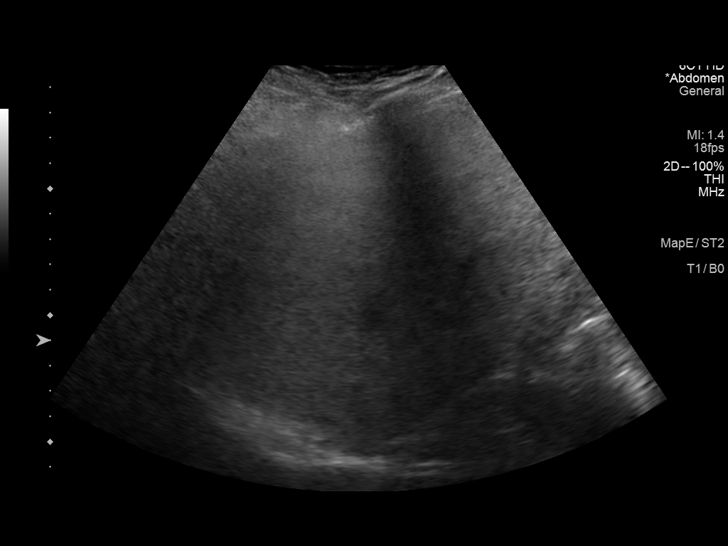
[im 28/34]
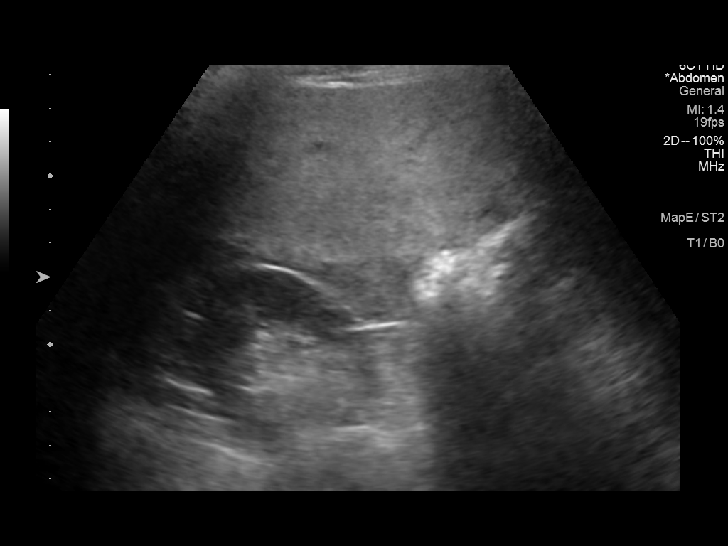
[im 31/34]
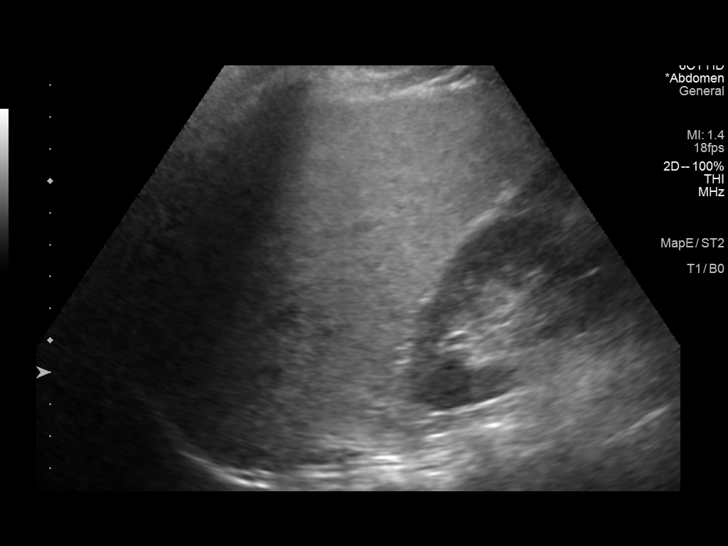
[im 34/34]
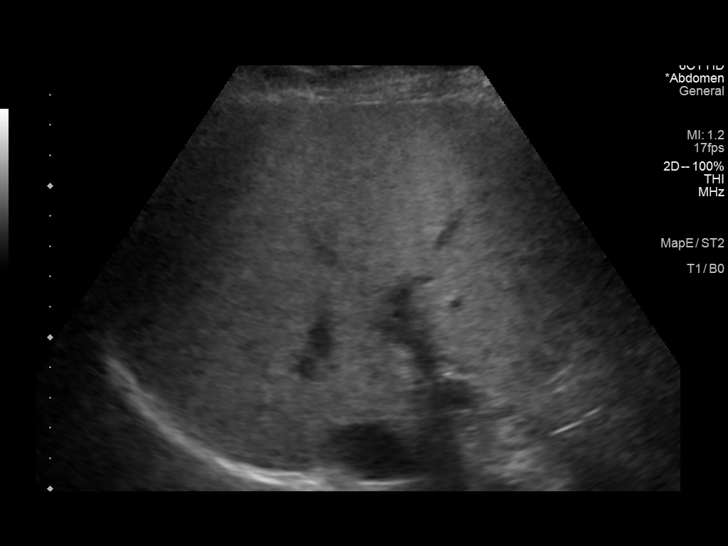

[14 of 25 positions shown; findings below may reference images not displayed]

FINDINGS: Gallbladder:

No gallstones or wall thickening visualized. No sonographic Murphy
sign noted by sonographer.

Common bile duct:

Diameter: 4 mm which is within normal limits.

Liver:

No focal lesion identified. Increased echogenicity of hepatic
parenchyma is noted suggesting hepatic steatosis or other diffuse
hepatocellular disease. Portal vein is patent on color Doppler
imaging with normal direction of blood flow towards the liver.

Other: None.
IMPRESSION: Increased echogenicity of hepatic parenchyma is noted suggesting
hepatic steatosis or other diffuse hepatocellular disease.

## 2020-02-22 ENCOUNTER — Other Ambulatory Visit: Payer: Self-pay | Admitting: Internal Medicine

## 2020-02-22 DIAGNOSIS — E781 Pure hyperglyceridemia: Secondary | ICD-10-CM

## 2020-02-23 ENCOUNTER — Other Ambulatory Visit: Payer: Self-pay | Admitting: Internal Medicine

## 2020-03-03 ENCOUNTER — Telehealth: Payer: Self-pay

## 2020-03-03 NOTE — Telephone Encounter (Signed)
PA sent for CELECOXIB 200MG capsules. Key: [id number]

## 2020-03-14 ENCOUNTER — Other Ambulatory Visit: Payer: Self-pay | Admitting: Internal Medicine

## 2020-03-14 DIAGNOSIS — I10 Essential (primary) hypertension: Secondary | ICD-10-CM

## 2020-04-06 ENCOUNTER — Other Ambulatory Visit: Payer: Self-pay | Admitting: Internal Medicine

## 2020-04-06 DIAGNOSIS — I10 Essential (primary) hypertension: Secondary | ICD-10-CM

## 2020-06-07 ENCOUNTER — Other Ambulatory Visit: Payer: Self-pay | Admitting: Internal Medicine

## 2020-06-07 DIAGNOSIS — M159 Polyosteoarthritis, unspecified: Secondary | ICD-10-CM

## 2020-06-07 DIAGNOSIS — M549 Dorsalgia, unspecified: Secondary | ICD-10-CM

## 2020-06-11 ENCOUNTER — Other Ambulatory Visit: Payer: Self-pay | Admitting: Internal Medicine

## 2020-06-11 DIAGNOSIS — K7581 Nonalcoholic steatohepatitis (NASH): Secondary | ICD-10-CM

## 2020-06-18 ENCOUNTER — Telehealth: Payer: Self-pay

## 2020-06-18 NOTE — Telephone Encounter (Signed)
Key: [id number]

## 2020-06-21 ENCOUNTER — Other Ambulatory Visit: Payer: Self-pay | Admitting: Internal Medicine

## 2020-06-21 DIAGNOSIS — E785 Hyperlipidemia, unspecified: Secondary | ICD-10-CM

## 2020-07-01 ENCOUNTER — Other Ambulatory Visit: Payer: Self-pay | Admitting: Internal Medicine

## 2020-07-01 DIAGNOSIS — I10 Essential (primary) hypertension: Secondary | ICD-10-CM

## 2020-07-07 ENCOUNTER — Telehealth: Payer: Self-pay

## 2020-07-07 DIAGNOSIS — I10 Essential (primary) hypertension: Secondary | ICD-10-CM

## 2020-07-07 MED ORDER — INDAPAMIDE 1.25 MG PO TABS: 1.2500 mg | ORAL_TABLET | Freq: Every day | ORAL | 1 refills | Status: DC

## 2020-07-07 NOTE — Telephone Encounter (Signed)
1.Medication Requested:indapamide (LOZOL) 1.25 MG tablet  2. Pharmacy (Name, [place], [place]):[id number]- [place], [place] - [address]. On Med List: yES   4. Last Visit with PCP: 8.3.20   5. Next visit date with PCP: 8.16.21    Agent: Please be advised that RX refills may take up to 3 business days. We ask that you follow-up with your pharmacy.

## 2020-07-07 NOTE — Telephone Encounter (Signed)
Erx has been sent as requested  

## 2020-07-14 ENCOUNTER — Other Ambulatory Visit: Payer: Self-pay

## 2020-07-14 ENCOUNTER — Encounter: Payer: Self-pay | Admitting: Internal Medicine

## 2020-07-14 ENCOUNTER — Ambulatory Visit (INDEPENDENT_AMBULATORY_CARE_PROVIDER_SITE_OTHER): Payer: BC Managed Care – PPO | Admitting: Internal Medicine

## 2020-07-14 VITALS — BP 166/92 | HR 84 | Temp 98.3°F | Resp 16 | Ht 66.0 in | Wt 207.1 lb

## 2020-07-14 DIAGNOSIS — I10 Essential (primary) hypertension: Secondary | ICD-10-CM

## 2020-07-14 DIAGNOSIS — K7581 Nonalcoholic steatohepatitis (NASH): Secondary | ICD-10-CM

## 2020-07-14 DIAGNOSIS — E785 Hyperlipidemia, unspecified: Secondary | ICD-10-CM | POA: Diagnosis not present

## 2020-07-14 DIAGNOSIS — R0789 Other chest pain: Secondary | ICD-10-CM

## 2020-07-14 DIAGNOSIS — R9431 Abnormal electrocardiogram [ECG] [EKG]: Secondary | ICD-10-CM | POA: Insufficient documentation

## 2020-07-14 DIAGNOSIS — Z1211 Encounter for screening for malignant neoplasm of colon: Secondary | ICD-10-CM

## 2020-07-14 DIAGNOSIS — J449 Chronic obstructive pulmonary disease, unspecified: Secondary | ICD-10-CM

## 2020-07-14 DIAGNOSIS — Z0000 Encounter for general adult medical examination without abnormal findings: Secondary | ICD-10-CM | POA: Diagnosis not present

## 2020-07-14 DIAGNOSIS — E781 Pure hyperglyceridemia: Secondary | ICD-10-CM | POA: Diagnosis not present

## 2020-07-14 MED ORDER — EDARBI 40 MG PO TABS: 1.0000 | ORAL_TABLET | Freq: Every day | ORAL | 0 refills | Status: DC

## 2020-07-14 NOTE — Patient Instructions (Signed)

## 2020-07-14 NOTE — Progress Notes (Signed)
Subjective:  Patient ID: Kenneth Offer Sr., male    DOB: [birth year]  Age: 64 y.o. MRN: [id number]  CC: Annual Exam, Hyperlipidemia, and Hypertension  This visit occurred during the SARS-CoV-2 public health emergency.  Safety protocols were in place, including screening questions prior to the visit, additional usage of staff PPE, and extensive cleaning of exam room while observing appropriate contact time as indicated for disinfecting solutions.    HPI Kenneth [facility] Sr. presents for a CPX.  He complains for several months that he has had chest tightness.  He tells me it occurs mostly at rest but there is also some with exertion.  He is active and denies DOE, diaphoresis, dizziness, or lightheadedness.  He has a chronic cough which is rarely productive of green phlegm.  He denies hemoptysis, wheezing, shortness of breath, or near syncope.  He does not think his blood pressure has been well controlled.  He stopped taking the ARB months ago and then he ran out of indapamide about a week ago.  He denies lower extremity edema.  Outpatient Medications Prior to Visit  Medication Sig Dispense Refill  . celecoxib (CELEBREX) 200 MG capsule TAKE 1 CAPSULE BY MOUTH EVERY DAY 90 capsule 1  . gabapentin (NEURONTIN) 100 MG capsule TAKE 1 CAPSULE BY MOUTH THREE TIMES A DAY 270 capsule 1  . indapamide (LOZOL) 1.25 MG tablet Take 1 tablet (1.25 mg total) by mouth daily. 90 tablet 1  . Multiple Vitamins-Minerals (CENTRUM SILVER PO) Take by mouth.    . pioglitazone (ACTOS) 15 MG tablet TAKE 1 TABLET BY MOUTH EVERY DAY 90 tablet 1  . pravastatin (PRAVACHOL) 80 MG tablet TAKE 1 TABLET BY MOUTH EVERY EVENING MUST KEEP APPT FOR REFILLS 90 tablet 1  . SPIRIVA RESPIMAT 2.5 MCG/ACT AERS INHALE 2 PUFFS BY MOUTH INTO THE LUNGS DAILY 36 g 1  . VASCEPA 1 g capsule TAKE 2 CAPSULES BY MOUTH 2 TIMES DAILY. 360 capsule 1  . losartan (COZAAR) 100 MG tablet Take 1 tablet (100 mg total) by mouth daily. 90 tablet 1   No  facility-administered medications prior to visit.    ROS Review of Systems  Constitutional: Negative for appetite change, diaphoresis, fatigue and unexpected weight change.  HENT: Negative.  Negative for sinus pressure, sore throat and trouble swallowing.   Eyes: Negative.   Respiratory: Positive for cough and chest tightness. Negative for choking, shortness of breath, wheezing and stridor.   Cardiovascular: Negative for chest pain, palpitations and leg swelling.  Gastrointestinal: Negative for abdominal pain, constipation, diarrhea, nausea and vomiting.  Endocrine: Negative.   Genitourinary: Negative.  Negative for difficulty urinating, scrotal swelling and testicular pain.  Musculoskeletal: Negative.  Negative for arthralgias, joint swelling and myalgias.  Skin: Negative.  Negative for color change and pallor.  Neurological: Negative.  Negative for dizziness, weakness, light-headedness, numbness and headaches.  Hematological: Negative for adenopathy. Does not bruise/bleed easily.  Psychiatric/Behavioral: Negative.     Objective:  BP (!) 166/92 (BP Location: Left Arm, Patient Position: Sitting, Cuff Size: Large) Comment: BP (R) 166/92 (L) 168/94  Pulse 84   Temp 98.3 F (36.8 C) (Oral)   Resp 16   Ht 5' 6"  (1.676 m)   Wt 207 lb 2 oz (94 kg)   SpO2 97%   BMI 33.43 kg/m   BP Readings from Last 3 Encounters:  07/14/20 (!) 166/92  07/02/19 138/82  10/09/18 140/80    Wt Readings from Last 3 Encounters:  07/14/20 207 lb 2  oz (94 kg)  07/02/19 199 lb (90.3 kg)  10/09/18 211 lb (95.7 kg)    Physical Exam Vitals reviewed.  Constitutional:      Appearance: Normal appearance.  HENT:     Nose: Nose normal.     Mouth/Throat:     Mouth: Mucous membranes are moist.  Eyes:     General: No scleral icterus.    Conjunctiva/sclera: Conjunctivae normal.  Cardiovascular:     Rate and Rhythm: Normal rate and regular rhythm.     Heart sounds: No murmur heard.  No friction rub. No  gallop.      Comments: EKG -  NSR, 77 bpm Loss of voltage in II, new compared to the prior EKG Voltage loss and Q waves in III and aVF are unchanged No LVH Pulmonary:     Effort: Pulmonary effort is normal.     Breath sounds: No stridor. Examination of the right-lower field reveals rhonchi. Examination of the left-lower field reveals rhonchi. Rhonchi present. No wheezing or rales.  Abdominal:     General: Abdomen is flat.     Palpations: There is no mass.     Tenderness: There is no abdominal tenderness. There is no guarding.     Hernia: No hernia is present. There is no hernia in the left inguinal area or right inguinal area.  Genitourinary:    Pubic Area: No rash.      Penis: Normal.      Testes: Normal.     Epididymis:     Right: Normal.     Left: Normal.     Prostate: Normal. Not enlarged, not tender and no nodules present.     Rectum: Normal. Guaiac result negative. No mass, tenderness, anal fissure, external hemorrhoid or internal hemorrhoid. Normal anal tone.  Musculoskeletal:        General: Normal range of motion.     Cervical back: Neck supple.     Right lower leg: No edema.     Left lower leg: No edema.  Lymphadenopathy:     Lower Body: No right inguinal adenopathy. No left inguinal adenopathy.  Skin:    General: Skin is warm and dry.     Coloration: Skin is not pale.  Neurological:     General: No focal deficit present.     Mental Status: He is alert and oriented to person, place, and time. Mental status is at baseline.  Psychiatric:        Mood and Affect: Mood normal.        Behavior: Behavior normal.     Lab Results  Component Value Date   WBC 6.1 07/14/2020   HGB 15.6 07/14/2020   HCT 45.1 07/14/2020   PLT 250 07/14/2020   GLUCOSE 107 (H) 07/14/2020   CHOL 221 (H) 07/14/2020   TRIG 158 (H) 07/14/2020   HDL 79 07/14/2020   LDLDIRECT 105.0 07/02/2019   LDLCALC 115 (H) 07/14/2020   ALT 21 07/14/2020   AST 22 07/14/2020   NA 137 07/14/2020   K 4.3  07/14/2020   CL 101 07/14/2020   CREATININE 0.91 07/14/2020   BUN 16 07/14/2020   CO2 29 07/14/2020   TSH 2.94 07/14/2020   PSA 0.3 07/14/2020   INR 0.9 07/14/2020   HGBA1C 5.0 10/09/2018    US Abdomen Limited RUQ  Result Date: 07/13/2019 CLINICAL DATA:  Elevated liver function tests. EXAM: ULTRASOUND ABDOMEN LIMITED RIGHT UPPER QUADRANT COMPARISON:  None. FINDINGS: Gallbladder: No gallstones or wall thickening visualized. No  sonographic Percell Miller sign noted by sonographer. Common bile duct: Diameter: 4 mm which is within normal limits. Liver: No focal lesion identified. Increased echogenicity of hepatic parenchyma is noted suggesting hepatic steatosis or other diffuse hepatocellular disease. Portal vein is patent on color Doppler imaging with normal direction of blood flow towards the liver. Other: None. IMPRESSION: Increased echogenicity of hepatic parenchyma is noted suggesting hepatic steatosis or other diffuse hepatocellular disease. Electronically Signed   By: Marijo Conception M.D.   On: 07/13/2019 13:27    Assessment & Plan:   Perry was seen today for annual exam, hyperlipidemia and hypertension.  Diagnoses and all orders for this visit:  Essential hypertension- His blood pressure is not adequately well controlled.  I have asked him to restart the ARB and thiazide diuretic. -     CBC with Differential/Platelet; Future -     TSH; Future -     BASIC METABOLIC PANEL WITH GFR; Future -     EKG 12-Lead -     BASIC METABOLIC PANEL WITH GFR -     TSH -     CBC with Differential/Platelet -     Azilsartan Medoxomil (EDARBI) 40 MG TABS; Take 1 tablet by mouth daily.  Nonalcoholic steatohepatitis (NASH)- His liver enzymes are much better.  He continues to drink 6 beers a day.  I have asked him to try to reduce this by at least 50%. -     Hepatic function panel; Future -     Protime-INR; Future -     Protime-INR -     Hepatic function panel  Dyslipidemia, goal LDL below 130- He has  achieved his LDL goal and is doing well on the statin.  Hypertriglyceridemia, essential- Improvement noted.  Routine general medical examination at a health care facility- Exam completed, labs reviewed, vaccines reviewed and updated, cancer screenings are up-to-date, patient education was given. -     Lipid panel; Future -     PSA; Future -     PSA -     Lipid panel  Colon cancer screening -     Cologuard  Abnormal electrocardiogram (ECG) (EKG) -     MYOCARDIAL PERFUSION IMAGING; Future  Chest tightness- He has chest tightness and subtle changes on his EKG.  He is at risk for CAD so I have asked him to undergo a myocardial perfusion imaging to screen for ischemia and wall motion abnormalities. -     MYOCARDIAL PERFUSION IMAGING; Future -     Troponin I (High Sensitivity); Future  Abnormal electrocardiogram- See above -     MYOCARDIAL PERFUSION IMAGING; Future  COPD (chronic obstructive pulmonary disease) with chronic bronchitis ([place])- His symptoms have improved with the LAMA.   I have discontinued Otho Michalik. Kenneth Sr.'s losartan. I am also having him start on Edarbi. Additionally, I am having him maintain his Multiple Vitamins-Minerals (CENTRUM SILVER PO), Spiriva Respimat, Vascepa, gabapentin, celecoxib, pioglitazone, pravastatin, and indapamide.  Meds ordered this encounter  Medications  . Azilsartan Medoxomil (EDARBI) 40 MG TABS    Sig: Take 1 tablet by mouth daily.    Dispense:  90 tablet    Refill:  0   In addition to time spent on CPE, I spent 60 minutes in preparing to see the patient by review of recent labs, imaging and procedures, obtaining and reviewing separately obtained history, communicating with the patient and family or caregiver, ordering medications, tests or procedures, and documenting clinical information in the EHR including the differential Dx,  treatment, and any further evaluation and other management of 1. Essential hypertension 2. Nonalcoholic steatohepatitis  (NASH) 3. Dyslipidemia, goal LDL below 130 4. Hypertriglyceridemia, essential 5. Abnormal electrocardiogram (ECG) (EKG) 6. Chest tightness 7. Abnormal electrocardiogram 8. COPD (chronic obstructive pulmonary disease) with chronic bronchitis ([place])     Follow-up: Return in about 3 months (around 10/14/2020).  Scarlette Calico, MD

## 2020-07-15 LAB — CBC WITH DIFFERENTIAL/PLATELET
Absolute Monocytes: 488 cells/uL (ref 200–950)
Basophils Absolute: 49 cells/uL (ref 0–200)
Basophils Relative: 0.8 %
Eosinophils Absolute: 61 cells/uL (ref 15–500)
Eosinophils Relative: 1 %
HCT: 45.1 % (ref 38.5–50.0)
Hemoglobin: 15.6 g/dL (ref 13.2–17.1)
Lymphs Abs: 939 cells/uL (ref 850–3900)
MCH: 33.6 pg — ABNORMAL HIGH (ref 27.0–33.0)
MCHC: 34.6 g/dL (ref 32.0–36.0)
MCV: 97.2 fL (ref 80.0–100.0)
MPV: 9.7 fL (ref 7.5–12.5)
Monocytes Relative: 8 %
Neutro Abs: 4563 cells/uL (ref 1500–7800)
Neutrophils Relative %: 74.8 %
Platelets: 250 10*3/uL (ref 140–400)
RBC: 4.64 10*6/uL (ref 4.20–5.80)
RDW: 11.8 % (ref 11.0–15.0)
Total Lymphocyte: 15.4 %
WBC: 6.1 10*3/uL (ref 3.8–10.8)

## 2020-07-15 LAB — PROTIME-INR
INR: 0.9
Prothrombin Time: 10 s (ref 9.0–11.5)

## 2020-07-15 LAB — HEPATIC FUNCTION PANEL
AG Ratio: 1.8 (calc) (ref 1.0–2.5)
ALT: 21 U/L (ref 9–46)
AST: 22 U/L (ref 10–35)
Albumin: 4.4 g/dL (ref 3.6–5.1)
Alkaline phosphatase (APISO): 70 U/L (ref 35–144)
Bilirubin, Direct: 0.2 mg/dL (ref 0.0–0.2)
Globulin: 2.5 g/dL (calc) (ref 1.9–3.7)
Indirect Bilirubin: 0.6 mg/dL (calc) (ref 0.2–1.2)
Total Bilirubin: 0.8 mg/dL (ref 0.2–1.2)
Total Protein: 6.9 g/dL (ref 6.1–8.1)

## 2020-07-15 LAB — PSA: PSA: 0.3 ng/mL (ref <=4.0)

## 2020-07-15 LAB — BASIC METABOLIC PANEL WITH GFR
BUN: 16 mg/dL (ref 7–25)
CO2: 29 mmol/L (ref 20–32)
Calcium: 9.3 mg/dL (ref 8.6–10.3)
Chloride: 101 mmol/L (ref 98–110)
Creat: 0.91 mg/dL (ref 0.70–1.25)
GFR, Est African American: 104 mL/min/{1.73_m2} (ref >=60)
GFR, Est Non African American: 89 mL/min/{1.73_m2} (ref >=60)
Glucose, Bld: 107 mg/dL — ABNORMAL HIGH (ref 65–99)
Potassium: 4.3 mmol/L (ref 3.5–5.3)
Sodium: 137 mmol/L (ref 135–146)

## 2020-07-15 LAB — LIPID PANEL
Cholesterol: 221 mg/dL — ABNORMAL HIGH (ref <200)
HDL: 79 mg/dL (ref >=40)
LDL Cholesterol (Calc): 115 mg/dL (calc) — ABNORMAL HIGH
Non-HDL Cholesterol (Calc): 142 mg/dL (calc) — ABNORMAL HIGH (ref <130)
Total CHOL/HDL Ratio: 2.8 (calc) (ref <5.0)
Triglycerides: 158 mg/dL — ABNORMAL HIGH (ref <150)

## 2020-07-15 LAB — TSH: TSH: 2.94 mIU/L (ref 0.40–4.50)

## 2020-07-16 ENCOUNTER — Encounter: Payer: Self-pay | Admitting: Internal Medicine

## 2020-07-18 ENCOUNTER — Other Ambulatory Visit: Payer: BC Managed Care – PPO

## 2020-07-18 LAB — TROPONIN I (HIGH SENSITIVITY): High Sens Troponin I: 5 ng/L (ref 2–17)

## 2020-07-22 ENCOUNTER — Telehealth (HOSPITAL_COMMUNITY): Payer: Self-pay | Admitting: *Deleted

## 2020-07-22 ENCOUNTER — Encounter (HOSPITAL_COMMUNITY): Payer: Self-pay | Admitting: *Deleted

## 2020-07-22 NOTE — Telephone Encounter (Signed)
Attempted calling patient to go over instructions for upcoming stress test on 07/25/20 but no answer and could not leave message.  Sent letter via My Chart outlining detailed instructions.

## 2020-07-23 ENCOUNTER — Telehealth (HOSPITAL_COMMUNITY): Payer: Self-pay

## 2020-07-23 NOTE — Telephone Encounter (Signed)
Returned phone message left by patient's wife, Verdis Frederickson on 07/22/20.  Patient's wife given detailed instructions per Myocardial Perfusion Study Information Sheet for the test on 08/27 at 1030. Patient notified to arrive 15 minutes early and that it is imperative to arrive on time for appointment to keep from having the test rescheduled.  Letter also sent in [place] on 8/24 via [facility] [place].   If you need to cancel or reschedule your appointment, please call the office within 24 hours of your appointment. . Patient verbalized understanding. TMY

## 2020-07-25 ENCOUNTER — Other Ambulatory Visit: Payer: Self-pay

## 2020-07-25 ENCOUNTER — Ambulatory Visit (HOSPITAL_COMMUNITY): Payer: BC Managed Care – PPO | Attending: Cardiovascular Disease

## 2020-07-25 DIAGNOSIS — R0789 Other chest pain: Secondary | ICD-10-CM | POA: Diagnosis not present

## 2020-07-25 DIAGNOSIS — R9431 Abnormal electrocardiogram [ECG] [EKG]: Secondary | ICD-10-CM | POA: Insufficient documentation

## 2020-07-25 LAB — MYOCARDIAL PERFUSION IMAGING
LV dias vol: 99 mL (ref 62–150)
LV sys vol: 38 mL
Peak HR: 104 {beats}/min
Rest HR: 80 {beats}/min
SDS: 0
SRS: 0
SSS: 0
TID: 1.15

## 2020-07-25 MED ORDER — TECHNETIUM TC 99M TETROFOSMIN IV KIT
10.6000 | PACK | Freq: Once | INTRAVENOUS | Status: AC | PRN
  Administered 2020-07-25: 10.6 via INTRAVENOUS
  Filled 2020-07-25: qty 11

## 2020-07-25 MED ORDER — TECHNETIUM TC 99M TETROFOSMIN IV KIT
30.7000 | PACK | Freq: Once | INTRAVENOUS | Status: AC | PRN
  Administered 2020-07-25: 30.7 via INTRAVENOUS
  Filled 2020-07-25: qty 31

## 2020-07-25 MED ORDER — REGADENOSON 0.4 MG/5ML IV SOLN
0.4000 mg | Freq: Once | INTRAVENOUS | Status: AC
  Administered 2020-07-25: 0.4 mg via INTRAVENOUS

## 2020-08-24 ENCOUNTER — Other Ambulatory Visit: Payer: Self-pay | Admitting: Internal Medicine

## 2020-09-02 ENCOUNTER — Other Ambulatory Visit: Payer: Self-pay | Admitting: Internal Medicine

## 2020-09-02 DIAGNOSIS — E781 Pure hyperglyceridemia: Secondary | ICD-10-CM

## 2020-09-30 ENCOUNTER — Telehealth: Payer: Self-pay

## 2020-09-30 NOTE — Telephone Encounter (Signed)
Pt states that his insurance company called & said they would not cover the cologuard as it was too early.  Last on 09/27/2017.  States since he would have to pay out of pocket, he was not going to do it.

## 2020-10-02 NOTE — Telephone Encounter (Signed)
Pt notified that Cologuard is q3years so he can go ahead complete the cologuard at this time since it was last completed on 09/27/17.  Pt wife states she was told by the insurance company that cologuard is covered q10years; that if he needed a colonoscopy it would be covered.  Will investigate with [employer] sciences & provide provider & patient next steps.

## 2020-10-06 ENCOUNTER — Other Ambulatory Visit: Payer: Self-pay | Admitting: Internal Medicine

## 2020-10-07 ENCOUNTER — Other Ambulatory Visit: Payer: Self-pay | Admitting: Internal Medicine

## 2020-10-07 DIAGNOSIS — Z1211 Encounter for screening for malignant neoplasm of colon: Secondary | ICD-10-CM

## 2020-10-07 NOTE — Telephone Encounter (Signed)
Wife aware of GI referral.  Will cancel cologuard.

## 2020-12-03 ENCOUNTER — Telehealth: Payer: Self-pay

## 2020-12-03 NOTE — Telephone Encounter (Signed)
Per CoverMyMeds: ? ?PA was denied.  ?

## 2020-12-03 NOTE — Telephone Encounter (Signed)
Key: [id number]

## 2020-12-07 ENCOUNTER — Other Ambulatory Visit: Payer: Self-pay | Admitting: Internal Medicine

## 2020-12-07 DIAGNOSIS — M159 Polyosteoarthritis, unspecified: Secondary | ICD-10-CM

## 2020-12-07 DIAGNOSIS — M8949 Other hypertrophic osteoarthropathy, multiple sites: Secondary | ICD-10-CM

## 2020-12-07 DIAGNOSIS — G8929 Other chronic pain: Secondary | ICD-10-CM

## 2020-12-07 DIAGNOSIS — K7581 Nonalcoholic steatohepatitis (NASH): Secondary | ICD-10-CM

## 2020-12-16 ENCOUNTER — Other Ambulatory Visit: Payer: Self-pay | Admitting: Internal Medicine

## 2020-12-16 DIAGNOSIS — E785 Hyperlipidemia, unspecified: Secondary | ICD-10-CM

## 2021-01-01 ENCOUNTER — Other Ambulatory Visit: Payer: Self-pay | Admitting: Internal Medicine

## 2021-01-01 DIAGNOSIS — I10 Essential (primary) hypertension: Secondary | ICD-10-CM

## 2021-01-07 ENCOUNTER — Telehealth: Payer: Self-pay | Admitting: Internal Medicine

## 2021-01-07 NOTE — Telephone Encounter (Signed)
Patients wife called and was wondering why on pravastatin (PRAVACHOL) 80 MG tablet that it says TAKE 1 TABLET BY MOUTH EVERY EVENING MUST KEEP APPT FOR REFILLS. She said that he never misses his appointments and doesn't understand why this is on his prescription. Please advise.

## 2021-01-07 NOTE — Telephone Encounter (Signed)
Patients wife called and had some questions in regards to the patient having a colonoscopy done. She had some questions about the insurance coverage.  Please advise. She can be reached at [phone].

## 2021-01-07 NOTE — Telephone Encounter (Signed)
Explained that verbiage was there on previous prescription & pt can ignore it.  Wife verb understanding.

## 2021-01-07 NOTE — Telephone Encounter (Signed)
Called and left a detailed msg on pt's vm

## 2021-01-07 NOTE — Telephone Encounter (Signed)
Spoke to pt

## 2021-01-12 ENCOUNTER — Encounter: Payer: Self-pay | Admitting: Gastroenterology

## 2021-01-12 NOTE — Telephone Encounter (Signed)
Patient's wife states that she was waiting to see if it was a routine screening prior to scheduling appt for colonoscopy; states she talked with their insurance company who confirms the last colonoscopy was done in 2009.  Wife assured that the colonoscopy that was ordered was a routine.  Wife states she will schedule the colonoscopy.

## 2021-02-06 ENCOUNTER — Other Ambulatory Visit: Payer: Self-pay | Admitting: Internal Medicine

## 2021-02-24 ENCOUNTER — Other Ambulatory Visit: Payer: Self-pay | Admitting: Internal Medicine

## 2021-02-24 DIAGNOSIS — E781 Pure hyperglyceridemia: Secondary | ICD-10-CM

## 2021-03-03 ENCOUNTER — Other Ambulatory Visit: Payer: Self-pay | Admitting: Internal Medicine

## 2021-03-03 DIAGNOSIS — K7581 Nonalcoholic steatohepatitis (NASH): Secondary | ICD-10-CM

## 2021-03-08 ENCOUNTER — Other Ambulatory Visit: Payer: Self-pay | Admitting: Internal Medicine

## 2021-03-08 DIAGNOSIS — E785 Hyperlipidemia, unspecified: Secondary | ICD-10-CM

## 2021-03-13 ENCOUNTER — Other Ambulatory Visit: Payer: Self-pay | Admitting: Internal Medicine

## 2021-03-16 ENCOUNTER — Other Ambulatory Visit: Payer: Self-pay

## 2021-03-16 ENCOUNTER — Ambulatory Visit (AMBULATORY_SURGERY_CENTER): Payer: BC Managed Care – PPO | Admitting: *Deleted

## 2021-03-16 VITALS — Ht 67.0 in | Wt 220.0 lb

## 2021-03-16 DIAGNOSIS — Z1211 Encounter for screening for malignant neoplasm of colon: Secondary | ICD-10-CM

## 2021-03-16 MED ORDER — NA SULFATE-K SULFATE-MG SULF 17.5-3.13-1.6 GM/177ML PO SOLN: ORAL | 0 refills | Status: DC

## 2021-03-16 NOTE — Progress Notes (Signed)
Patient's pre-visit was done today over the phone with the patient and wife due to COVID-19 pandemic. Name,DOB and address verified. Insurance verified. Patient denies any allergies to Eggs and Soy. Patient denies any problems with anesthesia/sedation. Patient denies taking diet pills or blood thinners. Packet of Prep instructions mailed to patient including a copy of a consent form-pt is aware. Patient understands to call us back with any questions or concerns. The patient is COVID-19 fully vaccinated, per patient. Patient is aware of our care-partner policy and [id number] safety protocol. EMMI education assigned to the patient for the procedure, sent to [place]. I notified patient he should not drink any alcohol prep day.

## 2021-03-26 ENCOUNTER — Encounter: Payer: Self-pay | Admitting: Gastroenterology

## 2021-03-27 ENCOUNTER — Ambulatory Visit (AMBULATORY_SURGERY_CENTER): Payer: BC Managed Care – PPO | Admitting: Gastroenterology

## 2021-03-27 ENCOUNTER — Other Ambulatory Visit: Payer: Self-pay

## 2021-03-27 ENCOUNTER — Encounter: Payer: Self-pay | Admitting: Gastroenterology

## 2021-03-27 VITALS — BP 127/76 | HR 83 | Temp 97.6°F | Resp 19 | Ht 67.0 in | Wt 220.0 lb

## 2021-03-27 DIAGNOSIS — D123 Benign neoplasm of transverse colon: Secondary | ICD-10-CM

## 2021-03-27 DIAGNOSIS — D125 Benign neoplasm of sigmoid colon: Secondary | ICD-10-CM

## 2021-03-27 DIAGNOSIS — Z1211 Encounter for screening for malignant neoplasm of colon: Secondary | ICD-10-CM

## 2021-03-27 MED ORDER — SODIUM CHLORIDE 0.9 % IV SOLN: 500.0000 mL | Freq: Once | INTRAVENOUS | Status: DC

## 2021-03-27 NOTE — Op Note (Signed)
[name] Patient Name: Kenneth Singleton Procedure Date: 03/27/2021 11:19 AM MRN: [id number] Endoscopist: Mauri Pole , MD Age: 65 Referring MD:  Date of Birth: November 22, 1956 Gender: Male Account #: [id number] Procedure:                Colonoscopy Indications:              Screening for colorectal malignant neoplasm Medicines:                Monitored Anesthesia Care Procedure:                Pre-Anesthesia Assessment:                           - Prior to the procedure, a History and Physical                            was performed, and patient medications and                            allergies were reviewed. The patient's tolerance of                            previous anesthesia was also reviewed. The risks                            and benefits of the procedure and the sedation                            options and risks were discussed with the patient.                            All questions were answered, and informed consent                            was obtained. Prior Anticoagulants: The patient has                            taken no previous anticoagulant or antiplatelet                            agents. ASA Grade Assessment: II - A patient with                            mild systemic disease. After reviewing the risks                            and benefits, the patient was deemed in                            satisfactory condition to undergo the procedure.                           After obtaining informed consent, the colonoscope  was passed under direct vision. Throughout the                            procedure, the patient's blood pressure, pulse, and                            oxygen saturations were monitored continuously. The                            Olympus PCF-H190DL ([id number]) Colonoscope was                            introduced through the anus and advanced to the the                            cecum, identified  by appendiceal orifice and                            ileocecal valve. The colonoscopy was performed                            without difficulty. The patient tolerated the                            procedure well. The quality of the bowel                            preparation was excellent. The ileocecal valve,                            appendiceal orifice, and rectum were photographed. Scope In: 11:31:41 AM Scope Out: 11:43:28 AM Scope Withdrawal Time: 0 hours 10 minutes 11 seconds  Total Procedure Duration: 0 hours 11 minutes 47 seconds  Findings:                 The perianal and digital rectal examinations were                            normal.                           A few small and large-mouthed diverticula were                            found in the sigmoid colon and ascending colon.                           A 5 mm polyp was found in the transverse colon. The                            polyp was sessile. The polyp was removed with a                            cold snare. Resection and retrieval were complete.  A less than 1 mm polyp was found in the sigmoid                            colon. The polyp was sessile. The polyp was removed                            with a cold biopsy forceps. Resection and retrieval                            were complete.                           Non-bleeding internal hemorrhoids were found during                            retroflexion. The hemorrhoids were medium-sized. Complications:            No immediate complications. Estimated Blood Loss:     Estimated blood loss was minimal. Impression:               - Diverticulosis in the sigmoid colon and in the                            ascending colon.                           - One 5 mm polyp in the transverse colon, removed                            with a cold snare. Resected and retrieved.                           - One less than 1 mm polyp in the sigmoid  colon,                            removed with a cold biopsy forceps. Resected and                            retrieved.                           - Non-bleeding internal hemorrhoids. Recommendation:           - Patient has a contact number available for                            emergencies. The signs and symptoms of potential                            delayed complications were discussed with the                            patient. Return to normal activities tomorrow.  Written discharge instructions were provided to the                            patient.                           - Resume previous diet.                           - Continue present medications.                           - Await pathology results.                           - Repeat colonoscopy in 5-10 years for surveillance                            based on pathology results. Mauri Pole, MD 03/27/2021 11:47:40 AM This report has been signed electronically.

## 2021-03-27 NOTE — Progress Notes (Signed)
PT taken to PACU. Monitors in place. VSS. Report given to RN. 

## 2021-03-27 NOTE — Progress Notes (Signed)
Called to room to assist during endoscopic procedure.  Patient ID and intended procedure confirmed with present staff. Received instructions for my participation in the procedure from the performing physician.  

## 2021-03-27 NOTE — Patient Instructions (Signed)
YOU HAD AN ENDOSCOPIC PROCEDURE TODAY AT THE [facility]:   Refer to the procedure report that was given to you for any specific questions about what was found during the examination.  If the procedure report does not answer your questions, please call your gastroenterologist to clarify.  If you requested that your care partner not be given the details of your procedure findings, then the procedure report has been included in a sealed envelope for you to review at your convenience later.  YOU SHOULD EXPECT: Some feelings of bloating in the abdomen. Passage of more gas than usual.  Walking can help get rid of the air that was put into your GI tract during the procedure and reduce the bloating. If you had a lower endoscopy (such as a colonoscopy or flexible sigmoidoscopy) you may notice spotting of blood in your stool or on the toilet paper. If you underwent a bowel prep for your procedure, you may not have a normal bowel movement for a few days.  Please Note:  You might notice some irritation and congestion in your nose or some drainage.  This is from the oxygen used during your procedure.  There is no need for concern and it should clear up in a day or so.  SYMPTOMS TO REPORT IMMEDIATELY:   Following lower endoscopy (colonoscopy or flexible sigmoidoscopy):  Excessive amounts of blood in the stool  Significant tenderness or worsening of abdominal pains  Swelling of the abdomen that is new, acute  Fever of 100F or higher   Following upper endoscopy (EGD)  Vomiting of blood or coffee ground material  New chest pain or pain under the shoulder blades  Painful or persistently difficult swallowing  New shortness of breath  Fever of 100F or higher  Black, tarry-looking stools  For urgent or emergent issues, a gastroenterologist can be reached at any hour by calling [phone]. Do not use MyChart messaging for urgent concerns.    DIET:  We do recommend a small meal at first, but  then you may proceed to your regular diet.  Drink plenty of fluids but you should avoid alcoholic beverages for 24 hours.  ACTIVITY:  You should plan to take it easy for the rest of today and you should NOT DRIVE or use heavy machinery until tomorrow (because of the sedation medicines used during the test).    FOLLOW UP: Our staff will call the number listed on your records 48-72 hours following your procedure to check on you and address any questions or concerns that you may have regarding the information given to you following your procedure. If we do not reach you, we will leave a message.  We will attempt to reach you two times.  During this call, we will ask if you have developed any symptoms of COVID 19. If you develop any symptoms (ie: fever, flu-like symptoms, shortness of breath, cough etc.) before then, please call [phone].  If you test positive for Covid 19 in the 2 weeks post procedure, please call and report this information to us.    If any biopsies were taken you will be contacted by phone or by letter within the next 1-3 weeks.  Please call us at [phone] if you have not heard about the biopsies in 3 weeks.    SIGNATURES/CONFIDENTIALITY: You and/or your care partner have signed paperwork which will be entered into your electronic medical record.  These signatures attest to the fact that that the information above on   your After Visit Summary has been reviewed and is understood.  Full responsibility of the confidentiality of this discharge information lies with you and/or your care-partner. 

## 2021-03-27 NOTE — Progress Notes (Signed)
Pt's states no medical or surgical changes since previsit or office visit. 

## 2021-03-31 ENCOUNTER — Telehealth: Payer: Self-pay | Admitting: *Deleted

## 2021-03-31 NOTE — Telephone Encounter (Signed)
1. Have you developed a fever since your procedure? no  2.   Have you had an respiratory symptoms (SOB or cough) since your procedure? no  3.   Have you tested positive for COVID 19 since your procedure no  4.   Have you had any family members/close contacts diagnosed with the COVID 19 since your procedure?  no   If yes to any of these questions please route to Joylene John, RN and Joella Prince, RN Follow up Call-  Call back number 03/27/2021  Post procedure Call Back phone  # [phone]  Permission to leave phone message Yes  Some recent data might be hidden     Patient questions:  Do you have a fever, pain , or abdominal swelling? No. Pain Score  0 *  Have you tolerated food without any problems? Yes.    Have you been able to return to your normal activities? Yes.    Do you have any questions about your discharge instructions: Diet   No. Medications  No. Follow up visit  No.  Do you have questions or concerns about your Care? No.  Actions: * If pain score is 4 or above: No action needed, pain <4.

## 2021-04-06 ENCOUNTER — Telehealth: Payer: Self-pay | Admitting: Internal Medicine

## 2021-04-06 DIAGNOSIS — I10 Essential (primary) hypertension: Secondary | ICD-10-CM

## 2021-04-06 NOTE — Telephone Encounter (Signed)
   Patient requesting short supply, appointment made for 6/7

## 2021-04-07 NOTE — Telephone Encounter (Signed)
Pt's wife Verdis Frederickson has been informed that Rx was denied. She doesn't understand why he was not given a reminder. I stated that is why we are adamant in getting pts to schedule their follow up visit prior to leaving after appointments. Verdis Frederickson stated that this is punishment for [place] and she does not agree with it.

## 2021-04-07 NOTE — Telephone Encounter (Signed)
Called pt, LVM.   Denied.  Per last OV AVS:  Follow-up: Return in about 3 months (around 10/14/2020).  Pt is overdue.

## 2021-04-07 NOTE — Telephone Encounter (Signed)
Patients wife called and is requesting a short supply be sent to pharmacy. Appointment made for 6/7.

## 2021-04-17 ENCOUNTER — Encounter: Payer: Self-pay | Admitting: Gastroenterology

## 2021-05-05 ENCOUNTER — Telehealth: Payer: Self-pay

## 2021-05-05 ENCOUNTER — Encounter: Payer: Self-pay | Admitting: Internal Medicine

## 2021-05-05 ENCOUNTER — Ambulatory Visit: Payer: BC Managed Care – PPO | Admitting: Internal Medicine

## 2021-05-05 ENCOUNTER — Other Ambulatory Visit: Payer: Self-pay

## 2021-05-05 VITALS — BP 156/88 | HR 73 | Temp 98.3°F | Ht 67.0 in | Wt 213.0 lb

## 2021-05-05 DIAGNOSIS — I10 Essential (primary) hypertension: Secondary | ICD-10-CM | POA: Diagnosis not present

## 2021-05-05 DIAGNOSIS — K7581 Nonalcoholic steatohepatitis (NASH): Secondary | ICD-10-CM

## 2021-05-05 DIAGNOSIS — Z23 Encounter for immunization: Secondary | ICD-10-CM | POA: Diagnosis not present

## 2021-05-05 DIAGNOSIS — G8929 Other chronic pain: Secondary | ICD-10-CM

## 2021-05-05 DIAGNOSIS — J449 Chronic obstructive pulmonary disease, unspecified: Secondary | ICD-10-CM

## 2021-05-05 DIAGNOSIS — E785 Hyperlipidemia, unspecified: Secondary | ICD-10-CM | POA: Diagnosis not present

## 2021-05-05 DIAGNOSIS — M8949 Other hypertrophic osteoarthropathy, multiple sites: Secondary | ICD-10-CM

## 2021-05-05 DIAGNOSIS — M549 Dorsalgia, unspecified: Secondary | ICD-10-CM

## 2021-05-05 DIAGNOSIS — M159 Polyosteoarthritis, unspecified: Secondary | ICD-10-CM

## 2021-05-05 DIAGNOSIS — E781 Pure hyperglyceridemia: Secondary | ICD-10-CM | POA: Diagnosis not present

## 2021-05-05 MED ORDER — CELECOXIB 200 MG PO CAPS
ORAL_CAPSULE | ORAL | 0 refills | Status: DC
Start: 2021-05-05 — End: 2021-09-01

## 2021-05-05 MED ORDER — GABAPENTIN 100 MG PO CAPS: ORAL_CAPSULE | ORAL | 1 refills | Status: DC

## 2021-05-05 MED ORDER — PRAVASTATIN SODIUM 80 MG PO TABS
80.0000 mg | ORAL_TABLET | Freq: Every day | ORAL | 0 refills | Status: DC
Start: 2021-05-05 — End: 2021-08-11

## 2021-05-05 MED ORDER — PIOGLITAZONE HCL 15 MG PO TABS: 15.0000 mg | ORAL_TABLET | Freq: Every day | ORAL | 0 refills | Status: DC

## 2021-05-05 MED ORDER — ICOSAPENT ETHYL 1 G PO CAPS: 2.0000 g | ORAL_CAPSULE | Freq: Two times a day (BID) | ORAL | 1 refills | Status: DC

## 2021-05-05 MED ORDER — SPIRIVA RESPIMAT 2.5 MCG/ACT IN AERS: INHALATION_SPRAY | RESPIRATORY_TRACT | 1 refills | Status: DC

## 2021-05-05 MED ORDER — INDAPAMIDE 1.25 MG PO TABS: 1.2500 mg | ORAL_TABLET | Freq: Every day | ORAL | 0 refills | Status: DC

## 2021-05-05 NOTE — Telephone Encounter (Signed)
Key: [id number]  Approved 04/05/2021-05/04/2024

## 2021-05-05 NOTE — Patient Instructions (Signed)

## 2021-05-05 NOTE — Progress Notes (Signed)
Subjective:  Patient ID: Kenneth Offer Sr., male    DOB: [birth year]  Age: 65 y.o. MRN: [id number]  CC: Hypertension and COPD  This visit occurred during the SARS-CoV-2 public health emergency.  Safety protocols were in place, including screening questions prior to the visit, additional usage of staff PPE, and extensive cleaning of exam room while observing appropriate contact time as indicated for disinfecting solutions.    HPI Kenneth Singleton [facility] Sr. presents for f/up -   He has not been monitoring his blood pressure but he has been compliant with indapamide.  He is active and denies any recent episodes of chest pain, shortness of breath, palpitations, edema, diaphoresis, or fatigue.  Outpatient Medications Prior to Visit  Medication Sig Dispense Refill   Multiple Vitamins-Minerals (CENTRUM SILVER PO) Take by mouth.     omeprazole (PRILOSEC) 20 MG capsule Take 20 mg by mouth daily.     celecoxib (CELEBREX) 200 MG capsule TAKE 1 CAPSULE BY MOUTH EVERY DAY 90 capsule 1   gabapentin (NEURONTIN) 100 MG capsule TAKE 1 CAPSULE BY MOUTH THREE TIMES A DAY 270 capsule 1   indapamide (LOZOL) 1.25 MG tablet TAKE 1 TABLET (1.25 MG TOTAL) BY MOUTH DAILY. 90 tablet 0   pioglitazone (ACTOS) 15 MG tablet TAKE 1 TABLET BY MOUTH EVERY DAY 90 tablet 0   pravastatin (PRAVACHOL) 80 MG tablet TAKE 1 TABLET BY MOUTH EVERY EVENING MUST KEEP APPT FOR REFILLS 90 tablet 0   SPIRIVA RESPIMAT 2.5 MCG/ACT AERS INHALE 2 PUFFS BY MOUTH INTO THE LUNGS DAILY 36 g 1   VASCEPA 1 g capsule TAKE 2 CAPSULES BY MOUTH TWICE A DAY 360 capsule 1   No facility-administered medications prior to visit.    ROS Review of Systems  Constitutional:  Negative for appetite change, diaphoresis, fatigue and unexpected weight change.  HENT: Negative.    Eyes: Negative.  Negative for visual disturbance.  Respiratory:  Negative for cough, chest tightness, shortness of breath and wheezing.   Cardiovascular:  Negative for chest pain, palpitations and  leg swelling.  Gastrointestinal:  Negative for abdominal pain, constipation, diarrhea, nausea and vomiting.  Endocrine: Negative.   Genitourinary: Negative.  Negative for difficulty urinating.  Musculoskeletal:  Positive for arthralgias and back pain. Negative for myalgias.  Skin: Negative.  Negative for color change.  Neurological: Negative.  Negative for dizziness, weakness and light-headedness.  Hematological:  Negative for adenopathy. Does not bruise/bleed easily.  Psychiatric/Behavioral: Negative.     Objective:  BP (!) 156/88 (BP Location: Left Arm, Patient Position: Sitting, Cuff Size: Large)   Pulse 73   Temp 98.3 F (36.8 C) (Oral)   Ht 5' 7"  (1.702 m)   Wt 213 lb (96.6 kg)   SpO2 95%   BMI 33.36 kg/m   BP Readings from Last 3 Encounters:  05/05/21 (!) 156/88  03/27/21 127/76  07/14/20 (!) 166/92    Wt Readings from Last 3 Encounters:  05/05/21 213 lb (96.6 kg)  03/27/21 220 lb (99.8 kg)  03/16/21 220 lb (99.8 kg)    Physical Exam Vitals reviewed.  HENT:     Nose: Nose normal.     Mouth/Throat:     Mouth: Mucous membranes are moist.  Eyes:     General: No scleral icterus.    Conjunctiva/sclera: Conjunctivae normal.  Cardiovascular:     Rate and Rhythm: Normal rate and regular rhythm.     Heart sounds: No murmur heard. Pulmonary:     Effort: Pulmonary effort is normal.  Breath sounds: No stridor. No wheezing, rhonchi or rales.  Abdominal:     General: Abdomen is flat.     Palpations: There is no mass.     Tenderness: There is no abdominal tenderness. There is no guarding.  Musculoskeletal:        General: Normal range of motion.     Cervical back: Neck supple.     Right lower leg: No edema.     Left lower leg: No edema.  Lymphadenopathy:     Cervical: No cervical adenopathy.  Skin:    General: Skin is warm and dry.  Neurological:     General: No focal deficit present.     Mental Status: He is alert.  Psychiatric:        Mood and Affect: Mood  normal.        Behavior: Behavior normal.    Lab Results  Component Value Date   WBC 6.3 05/05/2021   HGB 14.5 05/05/2021   HCT 42.6 05/05/2021   PLT 217.0 05/05/2021   GLUCOSE 83 05/05/2021   CHOL 203 (H) 05/05/2021   TRIG 163.0 (H) 05/05/2021   HDL 68.00 05/05/2021   LDLDIRECT 105.0 07/02/2019   LDLCALC 102 (H) 05/05/2021   ALT 19 05/05/2021   AST 26 05/05/2021   NA 136 05/05/2021   K 3.8 05/05/2021   CL 102 05/05/2021   CREATININE 0.84 05/05/2021   BUN 14 05/05/2021   CO2 26 05/05/2021   TSH 2.94 07/14/2020   PSA 0.3 07/14/2020   INR 0.9 07/14/2020   HGBA1C 5.0 10/09/2018    US Abdomen Limited RUQ  Result Date: 07/13/2019 CLINICAL DATA:  Elevated liver function tests. EXAM: ULTRASOUND ABDOMEN LIMITED RIGHT UPPER QUADRANT COMPARISON:  None. FINDINGS: Gallbladder: No gallstones or wall thickening visualized. No sonographic Murphy sign noted by sonographer. Common bile duct: Diameter: 4 mm which is within normal limits. Liver: No focal lesion identified. Increased echogenicity of hepatic parenchyma is noted suggesting hepatic steatosis or other diffuse hepatocellular disease. Portal vein is patent on color Doppler imaging with normal direction of blood flow towards the liver. Other: None. IMPRESSION: Increased echogenicity of hepatic parenchyma is noted suggesting hepatic steatosis or other diffuse hepatocellular disease. Electronically Signed   By: Marijo Conception M.D.   On: 07/13/2019 13:27    Assessment & Plan:   Arel was seen today for hypertension and copd.  Diagnoses and all orders for this visit:  Need for vaccination -     Pneumococcal conjugate vaccine 20-valent (Prevnar 20)  Essential hypertension- His blood pressure is not adequately well controlled.  Will add an ARB. -     indapamide (LOZOL) 1.25 MG tablet; Take 1 tablet (1.25 mg total) by mouth daily. -     CBC with Differential/Platelet; Future -     Basic metabolic panel; Future -     Basic metabolic  panel -     CBC with Differential/Platelet -     olmesartan (BENICAR) 20 MG tablet; Take 1 tablet (20 mg total) by mouth daily.  Nonalcoholic steatohepatitis (NASH)- His LFTs are normal.  Will continue pioglitazone. -     pioglitazone (ACTOS) 15 MG tablet; Take 1 tablet (15 mg total) by mouth daily. -     Hepatic function panel; Future -     Hepatic function panel  Dyslipidemia, goal LDL below 130- LDL goal achieved. Doing well on the statin  -     pravastatin (PRAVACHOL) 80 MG tablet; Take 1 tablet (80 mg  total) by mouth daily. -     Lipid panel; Future -     Lipid panel  Hypertriglyceridemia, essential -     icosapent Ethyl (VASCEPA) 1 g capsule; Take 2 capsules (2 g total) by mouth 2 (two) times daily. -     Lipid panel; Future -     Lipid panel  Back pain, chronic -     celecoxib (CELEBREX) 200 MG capsule; TAKE 1 CAPSULE BY MOUTH EVERY DAY -     gabapentin (NEURONTIN) 100 MG capsule; TAKE 1 CAPSULE BY MOUTH THREE TIMES A DAY  Primary osteoarthritis involving multiple joints -     celecoxib (CELEBREX) 200 MG capsule; TAKE 1 CAPSULE BY MOUTH EVERY DAY  COPD (chronic obstructive pulmonary disease) with chronic bronchitis (HCC) -     Tiotropium Bromide Monohydrate (SPIRIVA RESPIMAT) 2.5 MCG/ACT AERS; INHALE 2 PUFFS BY MOUTH INTO THE LUNGS DAILY  Other orders -     Tdap vaccine greater than or equal to 7yo IM  I have changed Regan Rakers. Boran Sr.'s Vascepa to icosapent Ethyl. I have also changed his pioglitazone, pravastatin, and Spiriva Respimat. I am also having him start on olmesartan. Additionally, I am having him maintain his Multiple Vitamins-Minerals (CENTRUM SILVER PO), omeprazole, indapamide, celecoxib, and gabapentin.  Meds ordered this encounter  Medications   indapamide (LOZOL) 1.25 MG tablet    Sig: Take 1 tablet (1.25 mg total) by mouth daily.    Dispense:  90 tablet    Refill:  0    DX Code Needed  .   pioglitazone (ACTOS) 15 MG tablet    Sig: Take 1 tablet (15 mg  total) by mouth daily.    Dispense:  90 tablet    Refill:  0   pravastatin (PRAVACHOL) 80 MG tablet    Sig: Take 1 tablet (80 mg total) by mouth daily.    Dispense:  90 tablet    Refill:  0   icosapent Ethyl (VASCEPA) 1 g capsule    Sig: Take 2 capsules (2 g total) by mouth 2 (two) times daily.    Dispense:  360 capsule    Refill:  1   celecoxib (CELEBREX) 200 MG capsule    Sig: TAKE 1 CAPSULE BY MOUTH EVERY DAY    Dispense:  90 capsule    Refill:  0    DX Code Needed  .   Tiotropium Bromide Monohydrate (SPIRIVA RESPIMAT) 2.5 MCG/ACT AERS    Sig: INHALE 2 PUFFS BY MOUTH INTO THE LUNGS DAILY    Dispense:  36 g    Refill:  1   gabapentin (NEURONTIN) 100 MG capsule    Sig: TAKE 1 CAPSULE BY MOUTH THREE TIMES A DAY    Dispense:  270 capsule    Refill:  1   olmesartan (BENICAR) 20 MG tablet    Sig: Take 1 tablet (20 mg total) by mouth daily.    Dispense:  90 tablet    Refill:  0     Follow-up: Return in about 3 months (around 08/05/2021).  Scarlette Calico, MD

## 2021-05-06 LAB — CBC WITH DIFFERENTIAL/PLATELET
Basophils Absolute: 0.1 10*3/uL (ref 0.0–0.1)
Basophils Relative: 1 % (ref 0.0–3.0)
Eosinophils Absolute: 0.1 10*3/uL (ref 0.0–0.7)
Eosinophils Relative: 1.9 % (ref 0.0–5.0)
HCT: 42.6 % (ref 39.0–52.0)
Hemoglobin: 14.5 g/dL (ref 13.0–17.0)
Lymphocytes Relative: 18.9 % (ref 12.0–46.0)
Lymphs Abs: 1.2 10*3/uL (ref 0.7–4.0)
MCHC: 33.9 g/dL (ref 30.0–36.0)
MCV: 97.6 fl (ref 78.0–100.0)
Monocytes Absolute: 0.5 10*3/uL (ref 0.1–1.0)
Monocytes Relative: 8.7 % (ref 3.0–12.0)
Neutro Abs: 4.4 10*3/uL (ref 1.4–7.7)
Neutrophils Relative %: 69.5 % (ref 43.0–77.0)
Platelets: 217 10*3/uL (ref 150.0–400.0)
RBC: 4.36 Mil/uL (ref 4.22–5.81)
RDW: 12.9 % (ref 11.5–15.5)
WBC: 6.3 10*3/uL (ref 4.0–10.5)

## 2021-05-06 LAB — HEPATIC FUNCTION PANEL
ALT: 19 U/L (ref 0–53)
AST: 26 U/L (ref 0–37)
Albumin: 4.3 g/dL (ref 3.5–5.2)
Alkaline Phosphatase: 58 U/L (ref 39–117)
Bilirubin, Direct: 0.1 mg/dL (ref 0.0–0.3)
Total Bilirubin: 0.8 mg/dL (ref 0.2–1.2)
Total Protein: 7.1 g/dL (ref 6.0–8.3)

## 2021-05-06 LAB — LIPID PANEL
Cholesterol: 203 mg/dL — ABNORMAL HIGH (ref 0–200)
HDL: 68 mg/dL (ref >39.00)
LDL Cholesterol: 102 mg/dL — ABNORMAL HIGH (ref 0–99)
NonHDL: 134.77
Total CHOL/HDL Ratio: 3
Triglycerides: 163 mg/dL — ABNORMAL HIGH (ref 0.0–149.0)
VLDL: 32.6 mg/dL (ref 0.0–40.0)

## 2021-05-06 LAB — BASIC METABOLIC PANEL
BUN: 14 mg/dL (ref 6–23)
CO2: 26 mEq/L (ref 19–32)
Calcium: 9.1 mg/dL (ref 8.4–10.5)
Chloride: 102 mEq/L (ref 96–112)
Creatinine, Ser: 0.84 mg/dL (ref 0.40–1.50)
GFR: 92.32 mL/min (ref >60.00)
Glucose, Bld: 83 mg/dL (ref 70–99)
Potassium: 3.8 mEq/L (ref 3.5–5.1)
Sodium: 136 mEq/L (ref 135–145)

## 2021-05-06 MED ORDER — OLMESARTAN MEDOXOMIL 20 MG PO TABS
20.0000 mg | ORAL_TABLET | Freq: Every day | ORAL | 0 refills | Status: DC
Start: 2021-05-06 — End: 2021-08-05

## 2021-08-04 ENCOUNTER — Other Ambulatory Visit: Payer: Self-pay | Admitting: Internal Medicine

## 2021-08-04 DIAGNOSIS — K7581 Nonalcoholic steatohepatitis (NASH): Secondary | ICD-10-CM

## 2021-08-04 DIAGNOSIS — I10 Essential (primary) hypertension: Secondary | ICD-10-CM

## 2021-08-05 ENCOUNTER — Other Ambulatory Visit: Payer: Self-pay | Admitting: Internal Medicine

## 2021-08-05 ENCOUNTER — Ambulatory Visit: Payer: BC Managed Care – PPO | Admitting: Internal Medicine

## 2021-08-05 DIAGNOSIS — I10 Essential (primary) hypertension: Secondary | ICD-10-CM

## 2021-08-10 ENCOUNTER — Ambulatory Visit: Payer: BC Managed Care – PPO | Admitting: Internal Medicine

## 2021-08-11 ENCOUNTER — Other Ambulatory Visit: Payer: Self-pay | Admitting: Internal Medicine

## 2021-08-11 DIAGNOSIS — E785 Hyperlipidemia, unspecified: Secondary | ICD-10-CM

## 2021-09-01 ENCOUNTER — Other Ambulatory Visit: Payer: Self-pay | Admitting: Internal Medicine

## 2021-09-01 DIAGNOSIS — G8929 Other chronic pain: Secondary | ICD-10-CM

## 2021-09-01 DIAGNOSIS — M159 Polyosteoarthritis, unspecified: Secondary | ICD-10-CM

## 2021-09-01 DIAGNOSIS — M549 Dorsalgia, unspecified: Secondary | ICD-10-CM

## 2021-09-03 ENCOUNTER — Telehealth: Payer: Self-pay

## 2021-09-03 NOTE — Telephone Encounter (Signed)
Key: [id number]

## 2021-09-03 NOTE — Telephone Encounter (Signed)
Approved 08/04/2021-09/03/2022

## 2021-09-07 ENCOUNTER — Ambulatory Visit: Payer: BC Managed Care – PPO | Admitting: Internal Medicine

## 2021-09-07 ENCOUNTER — Other Ambulatory Visit: Payer: Self-pay

## 2021-09-07 ENCOUNTER — Encounter: Payer: Self-pay | Admitting: Internal Medicine

## 2021-09-07 VITALS — BP 148/86 | HR 86 | Temp 98.3°F | Resp 16 | Ht 67.0 in | Wt 199.0 lb

## 2021-09-07 DIAGNOSIS — E785 Hyperlipidemia, unspecified: Secondary | ICD-10-CM

## 2021-09-07 DIAGNOSIS — E876 Hypokalemia: Secondary | ICD-10-CM

## 2021-09-07 DIAGNOSIS — Z0000 Encounter for general adult medical examination without abnormal findings: Secondary | ICD-10-CM | POA: Diagnosis not present

## 2021-09-07 DIAGNOSIS — T502X5A Adverse effect of carbonic-anhydrase inhibitors, benzothiadiazides and other diuretics, initial encounter: Secondary | ICD-10-CM | POA: Diagnosis not present

## 2021-09-07 DIAGNOSIS — I10 Essential (primary) hypertension: Secondary | ICD-10-CM

## 2021-09-07 LAB — BASIC METABOLIC PANEL
BUN: 15 mg/dL (ref 6–23)
CO2: 29 mEq/L (ref 19–32)
Calcium: 9.2 mg/dL (ref 8.4–10.5)
Chloride: 97 mEq/L (ref 96–112)
Creatinine, Ser: 0.84 mg/dL (ref 0.40–1.50)
GFR: 92.1 mL/min (ref >60.00)
Glucose, Bld: 89 mg/dL (ref 70–99)
Potassium: 3.2 mEq/L — ABNORMAL LOW (ref 3.5–5.1)
Sodium: 135 mEq/L (ref 135–145)

## 2021-09-07 LAB — PSA: PSA: 0.38 ng/mL (ref 0.10–4.00)

## 2021-09-07 LAB — TSH: TSH: 2.54 u[IU]/mL (ref 0.35–5.50)

## 2021-09-07 NOTE — Patient Instructions (Signed)
Health Maintenance, Male Adopting a healthy lifestyle and getting preventive care are important in promoting health and wellness. Ask your health care provider about: The right schedule for you to have regular tests and exams. Things you can do on your own to prevent diseases and keep yourself healthy. What should I know about diet, weight, and exercise? Eat a healthy diet  Eat a diet that includes plenty of vegetables, fruits, low-fat dairy products, and lean protein. Do not eat a lot of foods that are high in solid fats, added sugars, or sodium. Maintain a healthy weight Body mass index (BMI) is a measurement that can be used to identify possible weight problems. It estimates body fat based on height and weight. Your health care provider can help determine your BMI and help you achieve or maintain a healthy weight. Get regular exercise Get regular exercise. This is one of the most important things you can do for your health. Most adults should: Exercise for at least 150 minutes each week. The exercise should increase your heart rate and make you sweat (moderate-intensity exercise). Do strengthening exercises at least twice a week. This is in addition to the moderate-intensity exercise. Spend less time sitting. Even light physical activity can be beneficial. Watch cholesterol and blood lipids Have your blood tested for lipids and cholesterol at 64 years of age, then have this test every 5 years. You may need to have your cholesterol levels checked more often if: Your lipid or cholesterol levels are high. You are older than 64 years of age. You are at high risk for heart disease. What should I know about cancer screening? Many types of cancers can be detected early and may often be prevented. Depending on your health history and family history, you may need to have cancer screening at various ages. This may include screening for: Colorectal cancer. Prostate cancer. Skin cancer. Lung  cancer. What should I know about heart disease, diabetes, and high blood pressure? Blood pressure and heart disease High blood pressure causes heart disease and increases the risk of stroke. This is more likely to develop in people who have high blood pressure readings, are of African descent, or are overweight. Talk with your health care provider about your target blood pressure readings. Have your blood pressure checked: Every 3-5 years if you are 18-39 years of age. Every year if you are 40 years old or older. If you are between the ages of 65 and 75 and are a current or former smoker, ask your health care provider if you should have a one-time screening for abdominal aortic aneurysm (AAA). Diabetes Have regular diabetes screenings. This checks your fasting blood sugar level. Have the screening done: Once every three years after age 45 if you are at a normal weight and have a low risk for diabetes. More often and at a younger age if you are overweight or have a high risk for diabetes. What should I know about preventing infection? Hepatitis B If you have a higher risk for hepatitis B, you should be screened for this virus. Talk with your health care provider to find out if you are at risk for hepatitis B infection. Hepatitis C Blood testing is recommended for: Everyone born from 1945 through 1965. Anyone with known risk factors for hepatitis C. Sexually transmitted infections (STIs) You should be screened each year for STIs, including gonorrhea and chlamydia, if: You are sexually active and are younger than 64 years of age. You are older than 64 years   of age and your health care provider tells you that you are at risk for this type of infection. Your sexual activity has changed since you were last screened, and you are at increased risk for chlamydia or gonorrhea. Ask your health care provider if you are at risk. Ask your health care provider about whether you are at high risk for HIV.  Your health care provider may recommend a prescription medicine to help prevent HIV infection. If you choose to take medicine to prevent HIV, you should first get tested for HIV. You should then be tested every 3 months for as long as you are taking the medicine. Follow these instructions at home: Lifestyle Do not use any products that contain nicotine or tobacco, such as cigarettes, e-cigarettes, and chewing tobacco. If you need help quitting, ask your health care provider. Do not use street drugs. Do not share needles. Ask your health care provider for help if you need support or information about quitting drugs. Alcohol use Do not drink alcohol if your health care provider tells you not to drink. If you drink alcohol: Limit how much you have to 0-2 drinks a day. Be aware of how much alcohol is in your drink. In the [place] one drink equals one 12 oz bottle of beer (355 mL), one 5 oz glass of wine (148 mL), or one 1 oz glass of hard liquor (44 mL). General instructions Schedule regular health, dental, and eye exams. Stay current with your vaccines. Tell your health care provider if: You often feel depressed. You have ever been abused or do not feel safe at home. Summary Adopting a healthy lifestyle and getting preventive care are important in promoting health and wellness. Follow your health care provider's instructions about healthy diet, exercising, and getting tested or screened for diseases. Follow your health care provider's instructions on monitoring your cholesterol and blood pressure. This information is not intended to replace advice given to you by your health care provider. Make sure you discuss any questions you have with your health care provider. Document Revised: 01/23/2021 Document Reviewed: 11/08/2018 Elsevier Patient Education  2022 [employer] Inc[employer]  

## 2021-09-07 NOTE — Progress Notes (Signed)
Subjective:  Patient ID: Kenneth Offer Sr., male    DOB: 1956/08/03  Age: 65 y.o. MRN: [id number]  CC: Annual Exam, Hypertension, and Hyperlipidemia  This visit occurred during the SARS-CoV-2 public health emergency.  Safety protocols were in place, including screening questions prior to the visit, additional usage of staff PPE, and extensive cleaning of exam room while observing appropriate contact time as indicated for disinfecting solutions.    HPI [facility] Sr. presents for a CPX.  He is active and denies chest pain, shortness of breath, diaphoresis, dizziness, lightheadedness, or edema.  He has decided not to take the ARB because it made it feel swimmy headed.   Outpatient Medications Prior to Visit  Medication Sig Dispense Refill   celecoxib (CELEBREX) 200 MG capsule TAKE 1 CAPSULE BY MOUTH EVERY DAY 90 capsule 0   gabapentin (NEURONTIN) 100 MG capsule TAKE 1 CAPSULE BY MOUTH THREE TIMES A DAY 270 capsule 1   icosapent Ethyl (VASCEPA) 1 g capsule Take 2 capsules (2 g total) by mouth 2 (two) times daily. 360 capsule 1   indapamide (LOZOL) 1.25 MG tablet TAKE 1 TABLET BY MOUTH DAILY. 90 tablet 0   Multiple Vitamins-Minerals (CENTRUM SILVER PO) Take by mouth.     omeprazole (PRILOSEC) 20 MG capsule Take 20 mg by mouth daily.     pioglitazone (ACTOS) 15 MG tablet TAKE 1 TABLET (15 MG TOTAL) BY MOUTH DAILY. 90 tablet 0   pravastatin (PRAVACHOL) 80 MG tablet TAKE 1 TABLET BY MOUTH EVERY DAY 90 tablet 0   Tiotropium Bromide Monohydrate (SPIRIVA RESPIMAT) 2.5 MCG/ACT AERS INHALE 2 PUFFS BY MOUTH INTO THE LUNGS DAILY 36 g 1   olmesartan (BENICAR) 20 MG tablet TAKE 1 TABLET BY MOUTH EVERY DAY 90 tablet 0   No facility-administered medications prior to visit.    ROS Review of Systems  Constitutional:  Negative for chills, diaphoresis, fatigue and fever.  HENT: Negative.    Eyes: Negative.   Respiratory: Negative.  Negative for cough, chest tightness, shortness of breath and wheezing.    Cardiovascular:  Negative for chest pain, palpitations and leg swelling.  Gastrointestinal:  Negative for abdominal pain, constipation, diarrhea, nausea and vomiting.  Endocrine: Negative.   Genitourinary: Negative.  Negative for difficulty urinating, scrotal swelling and testicular pain.  Musculoskeletal: Negative.  Negative for arthralgias and myalgias.  Skin: Negative.   Neurological: Negative.  Negative for dizziness, weakness, light-headedness and headaches.  Hematological:  Negative for adenopathy. Does not bruise/bleed easily.   Objective:  BP (!) 148/86 (BP Location: Right Arm, Patient Position: Sitting, Cuff Size: Large)   Pulse 86   Temp 98.3 F (36.8 C) (Oral)   Resp 16   Ht 5' 7"  (1.702 m)   Wt 199 lb (90.3 kg)   SpO2 96%   BMI 31.17 kg/m   BP Readings from Last 3 Encounters:  09/07/21 (!) 148/86  05/05/21 (!) 156/88  03/27/21 127/76    Wt Readings from Last 3 Encounters:  09/07/21 199 lb (90.3 kg)  05/05/21 213 lb (96.6 kg)  03/27/21 220 lb (99.8 kg)    Physical Exam Vitals reviewed.  Constitutional:      Appearance: Normal appearance.  HENT:     Nose: Nose normal.     Mouth/Throat:     Mouth: Mucous membranes are moist.  Eyes:     General: No scleral icterus.    Conjunctiva/sclera: Conjunctivae normal.  Cardiovascular:     Rate and Rhythm: Normal rate and regular  rhythm.     Heart sounds: Normal heart sounds. No murmur heard. Pulmonary:     Effort: Pulmonary effort is normal.     Breath sounds: No stridor. No wheezing, rhonchi or rales.  Abdominal:     General: Abdomen is flat. There is no distension.     Palpations: There is no mass.     Tenderness: There is no abdominal tenderness. There is no guarding.  Musculoskeletal:        General: Normal range of motion.     Cervical back: Neck supple.     Right lower leg: No edema.     Left lower leg: No edema.  Lymphadenopathy:     Cervical: No cervical adenopathy.  Skin:    General: Skin is warm  and dry.  Neurological:     General: No focal deficit present.     Mental Status: He is alert.  Psychiatric:        Mood and Affect: Mood normal.    Lab Results  Component Value Date   WBC 6.3 05/05/2021   HGB 14.5 05/05/2021   HCT 42.6 05/05/2021   PLT 217.0 05/05/2021   GLUCOSE 89 09/07/2021   CHOL 203 (H) 05/05/2021   TRIG 163.0 (H) 05/05/2021   HDL 68.00 05/05/2021   LDLDIRECT 105.0 07/02/2019   LDLCALC 102 (H) 05/05/2021   ALT 19 05/05/2021   AST 26 05/05/2021   NA 135 09/07/2021   K 3.2 (L) 09/07/2021   CL 97 09/07/2021   CREATININE 0.84 09/07/2021   BUN 15 09/07/2021   CO2 29 09/07/2021   TSH 2.54 09/07/2021   PSA 0.38 09/07/2021   INR 0.9 07/14/2020   HGBA1C 5.0 10/09/2018    US Abdomen Limited RUQ  Result Date: 07/13/2019 CLINICAL DATA:  Elevated liver function tests. EXAM: ULTRASOUND ABDOMEN LIMITED RIGHT UPPER QUADRANT COMPARISON:  None. FINDINGS: Gallbladder: No gallstones or wall thickening visualized. No sonographic Murphy sign noted by sonographer. Common bile duct: Diameter: 4 mm which is within normal limits. Liver: No focal lesion identified. Increased echogenicity of hepatic parenchyma is noted suggesting hepatic steatosis or other diffuse hepatocellular disease. Portal vein is patent on color Doppler imaging with normal direction of blood flow towards the liver. Other: None. IMPRESSION: Increased echogenicity of hepatic parenchyma is noted suggesting hepatic steatosis or other diffuse hepatocellular disease. Electronically Signed   By: Marijo Conception M.D.   On: 07/13/2019 13:27    Assessment & Plan:   Orlandus was seen today for annual exam, hypertension and hyperlipidemia.  Diagnoses and all orders for this visit:  Essential hypertension- He has not achieved his blood pressure goal.  I will treat the hypokalemia and he will improve his lifestyle modifications. -     TSH; Future -     Urinalysis, Routine w reflex microscopic; Future -     Basic  metabolic panel; Future -     Basic metabolic panel -     Urinalysis, Routine w reflex microscopic -     TSH -     potassium chloride SA (KLOR-CON M15) 15 MEQ tablet; Take 1 tablet (15 mEq total) by mouth 2 (two) times daily.  Routine general medical examination at a health care facility- Exam completed, labs reviewed, he refused a flu vaccine, cancer screenings are up-to-date, patient education was given. -     PSA; Future -     PSA  Dyslipidemia, goal LDL below 130- LDL goal achieved. Doing well on the statin  -  TSH; Future -     TSH  Diuretic-induced hypokalemia -     potassium chloride SA (KLOR-CON M15) 15 MEQ tablet; Take 1 tablet (15 mEq total) by mouth 2 (two) times daily.  I have discontinued Karrington Studnicka. Farnworth Sr.'s olmesartan. I am also having him start on potassium chloride SA. Additionally, I am having him maintain his Multiple Vitamins-Minerals (CENTRUM SILVER PO), omeprazole, icosapent Ethyl, Spiriva Respimat, gabapentin, indapamide, pioglitazone, pravastatin, and celecoxib.  Meds ordered this encounter  Medications   potassium chloride SA (KLOR-CON M15) 15 MEQ tablet    Sig: Take 1 tablet (15 mEq total) by mouth 2 (two) times daily.    Dispense:  180 tablet    Refill:  0      Follow-up: Return in about 6 months (around 03/08/2022).  Scarlette Calico, MD

## 2021-09-08 DIAGNOSIS — T502X5A Adverse effect of carbonic-anhydrase inhibitors, benzothiadiazides and other diuretics, initial encounter: Secondary | ICD-10-CM | POA: Insufficient documentation

## 2021-09-08 DIAGNOSIS — E876 Hypokalemia: Secondary | ICD-10-CM | POA: Insufficient documentation

## 2021-09-08 LAB — URINALYSIS, ROUTINE W REFLEX MICROSCOPIC
Bilirubin Urine: NEGATIVE
Hgb urine dipstick: NEGATIVE
Ketones, ur: NEGATIVE
Leukocytes,Ua: NEGATIVE
Nitrite: NEGATIVE
RBC / HPF: NONE SEEN (ref 0–?)
Specific Gravity, Urine: 1.01 (ref 1.000–1.030)
Total Protein, Urine: NEGATIVE
Urine Glucose: NEGATIVE
Urobilinogen, UA: 0.2 (ref 0.0–1.0)
pH: 6 (ref 5.0–8.0)

## 2021-09-08 MED ORDER — POTASSIUM CHLORIDE CRYS ER 15 MEQ PO TBCR: 15.0000 meq | EXTENDED_RELEASE_TABLET | Freq: Two times a day (BID) | ORAL | 0 refills | Status: DC

## 2021-10-30 ENCOUNTER — Other Ambulatory Visit: Payer: Self-pay | Admitting: Internal Medicine

## 2021-10-30 DIAGNOSIS — I10 Essential (primary) hypertension: Secondary | ICD-10-CM

## 2021-10-30 DIAGNOSIS — K7581 Nonalcoholic steatohepatitis (NASH): Secondary | ICD-10-CM

## 2021-10-31 ENCOUNTER — Other Ambulatory Visit: Payer: Self-pay | Admitting: Internal Medicine

## 2021-10-31 DIAGNOSIS — E781 Pure hyperglyceridemia: Secondary | ICD-10-CM

## 2021-11-03 ENCOUNTER — Other Ambulatory Visit: Payer: Self-pay | Admitting: Internal Medicine

## 2021-11-03 DIAGNOSIS — J449 Chronic obstructive pulmonary disease, unspecified: Secondary | ICD-10-CM

## 2021-11-10 ENCOUNTER — Other Ambulatory Visit: Payer: Self-pay | Admitting: Internal Medicine

## 2021-11-10 DIAGNOSIS — E785 Hyperlipidemia, unspecified: Secondary | ICD-10-CM

## 2021-12-01 ENCOUNTER — Other Ambulatory Visit: Payer: Self-pay | Admitting: Internal Medicine

## 2021-12-01 DIAGNOSIS — G8929 Other chronic pain: Secondary | ICD-10-CM

## 2021-12-01 DIAGNOSIS — M159 Polyosteoarthritis, unspecified: Secondary | ICD-10-CM

## 2021-12-01 DIAGNOSIS — M549 Dorsalgia, unspecified: Secondary | ICD-10-CM

## 2021-12-14 ENCOUNTER — Ambulatory Visit: Payer: BC Managed Care – PPO | Admitting: Internal Medicine

## 2021-12-14 ENCOUNTER — Other Ambulatory Visit: Payer: Self-pay

## 2021-12-14 ENCOUNTER — Encounter: Payer: Self-pay | Admitting: Internal Medicine

## 2021-12-14 VITALS — BP 158/88 | HR 82 | Temp 98.5°F | Resp 16 | Ht 67.0 in | Wt 200.0 lb

## 2021-12-14 DIAGNOSIS — T502X5A Adverse effect of carbonic-anhydrase inhibitors, benzothiadiazides and other diuretics, initial encounter: Secondary | ICD-10-CM | POA: Diagnosis not present

## 2021-12-14 DIAGNOSIS — I10 Essential (primary) hypertension: Secondary | ICD-10-CM | POA: Diagnosis not present

## 2021-12-14 DIAGNOSIS — E876 Hypokalemia: Secondary | ICD-10-CM

## 2021-12-14 LAB — BASIC METABOLIC PANEL
BUN: 16 mg/dL (ref 6–23)
CO2: 28 mEq/L (ref 19–32)
Calcium: 9.6 mg/dL (ref 8.4–10.5)
Chloride: 95 mEq/L — ABNORMAL LOW (ref 96–112)
Creatinine, Ser: 0.9 mg/dL (ref 0.40–1.50)
GFR: 90.03 mL/min (ref >60.00)
Glucose, Bld: 87 mg/dL (ref 70–99)
Potassium: 3.3 mEq/L — ABNORMAL LOW (ref 3.5–5.1)
Sodium: 132 mEq/L — ABNORMAL LOW (ref 135–145)

## 2021-12-14 LAB — MAGNESIUM: Magnesium: 1.9 mg/dL (ref 1.5–2.5)

## 2021-12-14 MED ORDER — AMLODIPINE BESYLATE 5 MG PO TABS: 5.0000 mg | ORAL_TABLET | Freq: Every day | ORAL | 1 refills | Status: DC

## 2021-12-14 NOTE — Patient Instructions (Signed)

## 2021-12-14 NOTE — Progress Notes (Signed)
Subjective:  Patient ID: Kenneth Offer Sr., male    DOB: 04/06/1956  Age: 66 y.o. MRN: [id number]  CC: Hypertension  This visit occurred during the SARS-CoV-2 public health emergency.  Safety protocols were in place, including screening questions prior to the visit, additional usage of staff PPE, and extensive cleaning of exam room while observing appropriate contact time as indicated for disinfecting solutions.    HPI Kenneth [facility] Sr. presents for f/up -   He tells me he is compliant with indapamide but does not monitor his blood pressure.  He is active and denies chest pain, shortness of breath, diaphoresis, dizziness, lightheadedness, edema, or fatigue.  Outpatient Medications Prior to Visit  Medication Sig Dispense Refill   celecoxib (CELEBREX) 200 MG capsule TAKE 1 CAPSULE BY MOUTH EVERY DAY 90 capsule 0   gabapentin (NEURONTIN) 100 MG capsule TAKE 1 CAPSULE BY MOUTH THREE TIMES A DAY 270 capsule 1   icosapent Ethyl (VASCEPA) 1 g capsule TAKE 2 CAPSULES BY MOUTH 2 TIMES DAILY. 360 capsule 1   Multiple Vitamins-Minerals (CENTRUM SILVER PO) Take by mouth.     omeprazole (PRILOSEC) 20 MG capsule Take 20 mg by mouth daily.     pioglitazone (ACTOS) 15 MG tablet TAKE 1 TABLET (15 MG TOTAL) BY MOUTH DAILY. 90 tablet 0   pravastatin (PRAVACHOL) 80 MG tablet TAKE 1 TABLET BY MOUTH EVERY DAY 90 tablet 0   SPIRIVA RESPIMAT 2.5 MCG/ACT AERS INHALE 2 PUFFS BY MOUTH INTO THE LUNGS DAILY 12 g 1   indapamide (LOZOL) 1.25 MG tablet TAKE 1 TABLET BY MOUTH EVERY DAY 90 tablet 0   potassium chloride SA (KLOR-CON M15) 15 MEQ tablet Take 1 tablet (15 mEq total) by mouth 2 (two) times daily. 180 tablet 0   No facility-administered medications prior to visit.    ROS Review of Systems  Constitutional:  Negative for chills, diaphoresis, fatigue and fever.  HENT: Negative.    Eyes: Negative.   Respiratory:  Negative for cough, chest tightness, shortness of breath and wheezing.   Cardiovascular:  Negative  for chest pain, palpitations and leg swelling.  Gastrointestinal:  Negative for abdominal pain, constipation, diarrhea, nausea and vomiting.  Genitourinary: Negative.  Negative for difficulty urinating and hematuria.  Musculoskeletal: Negative.   Skin: Negative.   Neurological:  Negative for dizziness, weakness, light-headedness and headaches.  Hematological:  Negative for adenopathy. Does not bruise/bleed easily.  Psychiatric/Behavioral: Negative.     Objective:  BP (!) 158/88 (BP Location: Right Arm, Patient Position: Sitting, Cuff Size: Large)    Pulse 82    Temp 98.5 F (36.9 C) (Oral)    Resp 16    Ht 5' 7"  (1.702 m)    Wt 200 lb (90.7 kg)    SpO2 91%    BMI 31.32 kg/m   BP Readings from Last 3 Encounters:  12/14/21 (!) 158/88  09/07/21 (!) 148/86  05/05/21 (!) 156/88    Wt Readings from Last 3 Encounters:  12/14/21 200 lb (90.7 kg)  09/07/21 199 lb (90.3 kg)  05/05/21 213 lb (96.6 kg)    Physical Exam Vitals reviewed.  HENT:     Nose: Nose normal.     Mouth/Throat:     Mouth: Mucous membranes are moist.  Eyes:     General: No scleral icterus.    Conjunctiva/sclera: Conjunctivae normal.  Cardiovascular:     Rate and Rhythm: Normal rate and regular rhythm.     Heart sounds: No murmur heard. Pulmonary:  Effort: Pulmonary effort is normal.     Breath sounds: No stridor. No wheezing, rhonchi or rales.  Abdominal:     General: Abdomen is flat.     Palpations: There is no mass.     Tenderness: There is no abdominal tenderness. There is no guarding.     Hernia: No hernia is present.  Musculoskeletal:        General: Normal range of motion.     Cervical back: Neck supple.     Right lower leg: No edema.     Left lower leg: No edema.  Lymphadenopathy:     Cervical: No cervical adenopathy.  Skin:    General: Skin is warm and dry.  Neurological:     General: No focal deficit present.     Mental Status: He is alert. Mental status is at baseline.  Psychiatric:         Mood and Affect: Mood normal.        Behavior: Behavior normal.    Lab Results  Component Value Date   WBC 6.3 05/05/2021   HGB 14.5 05/05/2021   HCT 42.6 05/05/2021   PLT 217.0 05/05/2021   GLUCOSE 87 12/14/2021   CHOL 203 (H) 05/05/2021   TRIG 163.0 (H) 05/05/2021   HDL 68.00 05/05/2021   LDLDIRECT 105.0 07/02/2019   LDLCALC 102 (H) 05/05/2021   ALT 19 05/05/2021   AST 26 05/05/2021   NA 132 (L) 12/14/2021   K 3.3 (L) 12/14/2021   CL 95 (L) 12/14/2021   CREATININE 0.90 12/14/2021   BUN 16 12/14/2021   CO2 28 12/14/2021   TSH 2.54 09/07/2021   PSA 0.38 09/07/2021   INR 0.9 07/14/2020   HGBA1C 5.0 10/09/2018    US Abdomen Limited RUQ  Result Date: 07/13/2019 CLINICAL DATA:  Elevated liver function tests. EXAM: ULTRASOUND ABDOMEN LIMITED RIGHT UPPER QUADRANT COMPARISON:  None. FINDINGS: Gallbladder: No gallstones or wall thickening visualized. No sonographic Murphy sign noted by sonographer. Common bile duct: Diameter: 4 mm which is within normal limits. Liver: No focal lesion identified. Increased echogenicity of hepatic parenchyma is noted suggesting hepatic steatosis or other diffuse hepatocellular disease. Portal vein is patent on color Doppler imaging with normal direction of blood flow towards the liver. Other: None. IMPRESSION: Increased echogenicity of hepatic parenchyma is noted suggesting hepatic steatosis or other diffuse hepatocellular disease. Electronically Signed   By: Marijo Conception M.D.   On: 07/13/2019 13:27    Assessment & Plan:   Kenneth Singleton was seen today for hypertension.  Diagnoses and all orders for this visit:  Diuretic-induced hypokalemia -     Basic metabolic panel; Future -     Magnesium; Future -     Magnesium -     Basic metabolic panel -     potassium chloride SA (KLOR-CON M15) 15 MEQ tablet; Take 1 tablet (15 mEq total) by mouth 2 (two) times daily.  Essential hypertension- His blood pressure is not adequately well controlled and he has  developed several electrolyte abnormalities.  I have asked him to stop taking indapamide.  Will treat the high blood pressure with a CCB and a renin inhibitor.  Will also treat the hypokalemia. -     amLODipine (NORVASC) 5 MG tablet; Take 1 tablet (5 mg total) by mouth daily. -     potassium chloride SA (KLOR-CON M15) 15 MEQ tablet; Take 1 tablet (15 mEq total) by mouth 2 (two) times daily. -     aliskiren (TEKTURNA) 150  MG tablet; Take 1 tablet (150 mg total) by mouth daily.   I have discontinued Kenneth Singleton. Kenneth Sr.'s indapamide. I am also having him start on amLODipine and aliskiren. Additionally, I am having him maintain his Multiple Vitamins-Minerals (CENTRUM SILVER PO), omeprazole, gabapentin, pioglitazone, icosapent Ethyl, Spiriva Respimat, pravastatin, celecoxib, and potassium chloride SA.  Meds ordered this encounter  Medications   amLODipine (NORVASC) 5 MG tablet    Sig: Take 1 tablet (5 mg total) by mouth daily.    Dispense:  90 tablet    Refill:  1   potassium chloride SA (KLOR-CON M15) 15 MEQ tablet    Sig: Take 1 tablet (15 mEq total) by mouth 2 (two) times daily.    Dispense:  180 tablet    Refill:  0   aliskiren (TEKTURNA) 150 MG tablet    Sig: Take 1 tablet (150 mg total) by mouth daily.    Dispense:  90 tablet    Refill:  0     Follow-up: Return in about 3 months (around 03/14/2022).  Scarlette Calico, MD

## 2021-12-15 ENCOUNTER — Encounter: Payer: Self-pay | Admitting: Internal Medicine

## 2021-12-15 MED ORDER — ALISKIREN FUMARATE 150 MG PO TABS
150.0000 mg | ORAL_TABLET | Freq: Every day | ORAL | 0 refills | Status: DC
Start: 2021-12-15 — End: 2022-03-13

## 2021-12-15 MED ORDER — POTASSIUM CHLORIDE CRYS ER 15 MEQ PO TBCR: 15.0000 meq | EXTENDED_RELEASE_TABLET | Freq: Two times a day (BID) | ORAL | 0 refills | Status: DC

## 2022-01-28 ENCOUNTER — Other Ambulatory Visit: Payer: Self-pay | Admitting: Internal Medicine

## 2022-01-28 DIAGNOSIS — K7581 Nonalcoholic steatohepatitis (NASH): Secondary | ICD-10-CM

## 2022-01-28 DIAGNOSIS — I10 Essential (primary) hypertension: Secondary | ICD-10-CM

## 2022-01-28 MED ORDER — PIOGLITAZONE HCL 15 MG PO TABS
15.0000 mg | ORAL_TABLET | Freq: Every day | ORAL | 0 refills | Status: DC
Start: 2022-01-28 — End: 2022-07-06

## 2022-02-14 ENCOUNTER — Other Ambulatory Visit: Payer: Self-pay | Admitting: Internal Medicine

## 2022-02-14 DIAGNOSIS — E785 Hyperlipidemia, unspecified: Secondary | ICD-10-CM

## 2022-03-01 ENCOUNTER — Other Ambulatory Visit: Payer: Self-pay | Admitting: Internal Medicine

## 2022-03-01 DIAGNOSIS — M159 Polyosteoarthritis, unspecified: Secondary | ICD-10-CM

## 2022-03-01 DIAGNOSIS — G8929 Other chronic pain: Secondary | ICD-10-CM

## 2022-03-08 ENCOUNTER — Ambulatory Visit: Payer: BC Managed Care – PPO | Admitting: Internal Medicine

## 2022-03-08 ENCOUNTER — Encounter: Payer: Self-pay | Admitting: Internal Medicine

## 2022-03-08 VITALS — BP 132/74 | HR 77 | Temp 97.7°F | Ht 67.0 in | Wt 200.0 lb

## 2022-03-08 DIAGNOSIS — Z23 Encounter for immunization: Secondary | ICD-10-CM | POA: Diagnosis not present

## 2022-03-08 DIAGNOSIS — T502X5A Adverse effect of carbonic-anhydrase inhibitors, benzothiadiazides and other diuretics, initial encounter: Secondary | ICD-10-CM

## 2022-03-08 DIAGNOSIS — G8929 Other chronic pain: Secondary | ICD-10-CM

## 2022-03-08 DIAGNOSIS — E876 Hypokalemia: Secondary | ICD-10-CM

## 2022-03-08 DIAGNOSIS — M549 Dorsalgia, unspecified: Secondary | ICD-10-CM

## 2022-03-08 DIAGNOSIS — I10 Essential (primary) hypertension: Secondary | ICD-10-CM

## 2022-03-08 LAB — MAGNESIUM: Magnesium: 2 mg/dL (ref 1.5–2.5)

## 2022-03-08 LAB — BASIC METABOLIC PANEL
BUN: 16 mg/dL (ref 6–23)
CO2: 27 mEq/L (ref 19–32)
Calcium: 9.9 mg/dL (ref 8.4–10.5)
Chloride: 100 mEq/L (ref 96–112)
Creatinine, Ser: 0.87 mg/dL (ref 0.40–1.50)
GFR: 90.81 mL/min (ref >60.00)
Glucose, Bld: 84 mg/dL (ref 70–99)
Potassium: 4 mEq/L (ref 3.5–5.1)
Sodium: 134 mEq/L — ABNORMAL LOW (ref 135–145)

## 2022-03-08 MED ORDER — GABAPENTIN 100 MG PO CAPS: ORAL_CAPSULE | ORAL | 1 refills | Status: DC

## 2022-03-08 NOTE — Progress Notes (Signed)
? ?Subjective:  ?Patient ID: Kenneth MILKS Sr., male    DOB: [birth year]  Age: 66 y.o. MRN: [id number] ? ?CC: Hypertension ? ? ?HPI ?Tania Perrott [facility] Sr. presents for f/up - ? ?He is active and denies chest pain, diaphoresis, shortness of breath, edema, or fatigue. ? ?Outpatient Medications Prior to Visit  ?Medication Sig Dispense Refill  ? aliskiren (TEKTURNA) 150 MG tablet Take 1 tablet (150 mg total) by mouth daily. 90 tablet 0  ? amLODipine (NORVASC) 5 MG tablet Take 1 tablet (5 mg total) by mouth daily. 90 tablet 1  ? celecoxib (CELEBREX) 200 MG capsule TAKE 1 CAPSULE BY MOUTH EVERY DAY 90 capsule 0  ? icosapent Ethyl (VASCEPA) 1 g capsule TAKE 2 CAPSULES BY MOUTH 2 TIMES DAILY. 360 capsule 1  ? Multiple Vitamins-Minerals (CENTRUM SILVER PO) Take by mouth.    ? omeprazole (PRILOSEC) 20 MG capsule Take 20 mg by mouth daily.    ? pioglitazone (ACTOS) 15 MG tablet Take 1 tablet (15 mg total) by mouth daily. 90 tablet 0  ? potassium chloride SA (KLOR-CON M15) 15 MEQ tablet Take 1 tablet (15 mEq total) by mouth 2 (two) times daily. 180 tablet 0  ? pravastatin (PRAVACHOL) 80 MG tablet TAKE 1 TABLET BY MOUTH EVERY DAY 90 tablet 0  ? SPIRIVA RESPIMAT 2.5 MCG/ACT AERS INHALE 2 PUFFS BY MOUTH INTO THE LUNGS DAILY 12 g 1  ? gabapentin (NEURONTIN) 100 MG capsule TAKE 1 CAPSULE BY MOUTH THREE TIMES A DAY 270 capsule 1  ? ?No facility-administered medications prior to visit.  ? ? ?ROS ?Review of Systems  ?Constitutional:  Negative for chills, diaphoresis, fatigue and fever.  ?HENT: Negative.    ?Eyes: Negative.   ?Respiratory:  Negative for cough, chest tightness, shortness of breath and wheezing.   ?Cardiovascular:  Negative for chest pain, palpitations and leg swelling.  ?Gastrointestinal:  Negative for abdominal pain, constipation, diarrhea, nausea and vomiting.  ?Endocrine: Negative.   ?Genitourinary: Negative.   ?Musculoskeletal:  Positive for arthralgias and back pain. Negative for myalgias.  ?Skin: Negative.   ?Neurological:   Negative for dizziness and weakness.  ?Hematological:  Negative for adenopathy. Does not bruise/bleed easily.  ?Psychiatric/Behavioral: Negative.    ? ?Objective:  ?BP 132/74 (BP Location: Right Arm, Patient Position: Sitting, Cuff Size: Normal)   Pulse 77   Temp 97.7 ?F (36.5 ?C) (Oral)   Ht 5' 7"  (1.702 m)   Wt 200 lb (90.7 kg)   SpO2 95%   BMI 31.32 kg/m?  ? ?BP Readings from Last 3 Encounters:  ?03/08/22 132/74  ?12/14/21 (!) 158/88  ?09/07/21 (!) 148/86  ? ? ?Wt Readings from Last 3 Encounters:  ?03/08/22 200 lb (90.7 kg)  ?12/14/21 200 lb (90.7 kg)  ?09/07/21 199 lb (90.3 kg)  ? ? ?Physical Exam ?Vitals reviewed.  ?HENT:  ?   Nose: Nose normal.  ?   Mouth/Throat:  ?   Mouth: Mucous membranes are moist.  ?Eyes:  ?   General: No scleral icterus. ?   Conjunctiva/sclera: Conjunctivae normal.  ?Cardiovascular:  ?   Rate and Rhythm: Normal rate and regular rhythm.  ?   Heart sounds: No murmur heard. ?Pulmonary:  ?   Effort: Pulmonary effort is normal.  ?   Breath sounds: No stridor. No wheezing, rhonchi or rales.  ?Abdominal:  ?   General: Abdomen is flat.  ?   Palpations: There is no mass.  ?   Tenderness: There is no abdominal tenderness. There is  no guarding.  ?   Hernia: No hernia is present.  ?Musculoskeletal:     ?   General: Normal range of motion.  ?   Cervical back: Neck supple.  ?   Right lower leg: No edema.  ?   Left lower leg: No edema.  ?Lymphadenopathy:  ?   Cervical: No cervical adenopathy.  ?Skin: ?   General: Skin is warm.  ?Neurological:  ?   General: No focal deficit present.  ?   Mental Status: He is alert.  ?Psychiatric:     ?   Mood and Affect: Mood normal.     ?   Behavior: Behavior normal.  ? ? ?Lab Results  ?Component Value Date  ? WBC 6.3 05/05/2021  ? HGB 14.5 05/05/2021  ? HCT 42.6 05/05/2021  ? PLT 217.0 05/05/2021  ? GLUCOSE 84 03/08/2022  ? CHOL 203 (H) 05/05/2021  ? TRIG 163.0 (H) 05/05/2021  ? HDL 68.00 05/05/2021  ? LDLDIRECT 105.0 07/02/2019  ? LDLCALC 102 (H) 05/05/2021  ?  ALT 19 05/05/2021  ? AST 26 05/05/2021  ? NA 134 (L) 03/08/2022  ? K 4.0 03/08/2022  ? CL 100 03/08/2022  ? CREATININE 0.87 03/08/2022  ? BUN 16 03/08/2022  ? CO2 27 03/08/2022  ? TSH 2.54 09/07/2021  ? PSA 0.38 09/07/2021  ? INR 0.9 07/14/2020  ? HGBA1C 5.0 10/09/2018  ? ? ?US Abdomen Limited RUQ ? ?Result Date: 07/13/2019 ?CLINICAL DATA:  Elevated liver function tests. EXAM: ULTRASOUND ABDOMEN LIMITED RIGHT UPPER QUADRANT COMPARISON:  None. FINDINGS: Gallbladder: No gallstones or wall thickening visualized. No sonographic Murphy sign noted by sonographer. Common bile duct: Diameter: 4 mm which is within normal limits. Liver: No focal lesion identified. Increased echogenicity of hepatic parenchyma is noted suggesting hepatic steatosis or other diffuse hepatocellular disease. Portal vein is patent on color Doppler imaging with normal direction of blood flow towards the liver. Other: None. IMPRESSION: Increased echogenicity of hepatic parenchyma is noted suggesting hepatic steatosis or other diffuse hepatocellular disease. Electronically Signed   By: Marijo Conception M.D.   On: 07/13/2019 13:27  ? ? ?Assessment & Plan:  ? ?Meshulem was seen today for hypertension. ? ?Diagnoses and all orders for this visit: ? ?Essential hypertension- His blood pressure is adequately well controlled.  Electrolytes and renal function are normal. ?-     Basic metabolic panel; Future ?-     Magnesium; Future ?-     Magnesium ?-     Basic metabolic panel ? ?Diuretic-induced hypokalemia ?-     Basic metabolic panel; Future ?-     Magnesium; Future ?-     Magnesium ?-     Basic metabolic panel ? ?Back pain, chronic ?-     gabapentin (NEURONTIN) 100 MG capsule; TAKE 1 CAPSULE BY MOUTH THREE TIMES A DAY ? ?Other orders ?-     Varicella-zoster vaccine IM (Shingrix) ? ? ?I am having Kenneth Gabrielle. Spatz Sr. maintain his Multiple Vitamins-Minerals (CENTRUM SILVER PO), omeprazole, icosapent Ethyl, Spiriva Respimat, amLODipine, potassium chloride SA, aliskiren,  pioglitazone, pravastatin, celecoxib, and gabapentin. ? ?Meds ordered this encounter  ?Medications  ? gabapentin (NEURONTIN) 100 MG capsule  ?  Sig: TAKE 1 CAPSULE BY MOUTH THREE TIMES A DAY  ?  Dispense:  270 capsule  ?  Refill:  1  ? ? ? ?Follow-up: Return in about 4 months (around 07/08/2022). ? ?Scarlette Calico, MD ?

## 2022-03-08 NOTE — Patient Instructions (Signed)

## 2022-03-13 ENCOUNTER — Other Ambulatory Visit: Payer: Self-pay | Admitting: Internal Medicine

## 2022-03-13 DIAGNOSIS — I10 Essential (primary) hypertension: Secondary | ICD-10-CM

## 2022-04-05 DIAGNOSIS — M25561 Pain in right knee: Secondary | ICD-10-CM | POA: Diagnosis not present

## 2022-04-05 DIAGNOSIS — M1711 Unilateral primary osteoarthritis, right knee: Secondary | ICD-10-CM | POA: Insufficient documentation

## 2022-05-16 ENCOUNTER — Other Ambulatory Visit: Payer: Self-pay | Admitting: Internal Medicine

## 2022-05-16 DIAGNOSIS — E785 Hyperlipidemia, unspecified: Secondary | ICD-10-CM

## 2022-06-01 ENCOUNTER — Other Ambulatory Visit: Payer: Self-pay | Admitting: Internal Medicine

## 2022-06-01 DIAGNOSIS — G8929 Other chronic pain: Secondary | ICD-10-CM

## 2022-06-01 DIAGNOSIS — M159 Polyosteoarthritis, unspecified: Secondary | ICD-10-CM

## 2022-06-08 ENCOUNTER — Other Ambulatory Visit: Payer: Self-pay | Admitting: Internal Medicine

## 2022-06-08 DIAGNOSIS — I10 Essential (primary) hypertension: Secondary | ICD-10-CM

## 2022-06-09 ENCOUNTER — Other Ambulatory Visit: Payer: Self-pay | Admitting: Internal Medicine

## 2022-06-09 DIAGNOSIS — E781 Pure hyperglyceridemia: Secondary | ICD-10-CM

## 2022-06-11 ENCOUNTER — Other Ambulatory Visit: Payer: Self-pay | Admitting: Internal Medicine

## 2022-06-11 DIAGNOSIS — I10 Essential (primary) hypertension: Secondary | ICD-10-CM

## 2022-07-01 DIAGNOSIS — M180 Bilateral primary osteoarthritis of first carpometacarpal joints: Secondary | ICD-10-CM | POA: Diagnosis not present

## 2022-07-06 ENCOUNTER — Other Ambulatory Visit: Payer: Self-pay | Admitting: Internal Medicine

## 2022-07-06 DIAGNOSIS — K7581 Nonalcoholic steatohepatitis (NASH): Secondary | ICD-10-CM

## 2022-07-08 ENCOUNTER — Other Ambulatory Visit: Payer: Self-pay | Admitting: Internal Medicine

## 2022-07-08 DIAGNOSIS — J449 Chronic obstructive pulmonary disease, unspecified: Secondary | ICD-10-CM

## 2022-07-12 ENCOUNTER — Encounter: Payer: Self-pay | Admitting: Internal Medicine

## 2022-07-12 ENCOUNTER — Ambulatory Visit: Payer: BC Managed Care – PPO | Admitting: Internal Medicine

## 2022-07-12 VITALS — BP 132/78 | HR 77 | Temp 98.3°F | Resp 16 | Ht 67.0 in | Wt 197.0 lb

## 2022-07-12 DIAGNOSIS — E781 Pure hyperglyceridemia: Secondary | ICD-10-CM

## 2022-07-12 DIAGNOSIS — K7581 Nonalcoholic steatohepatitis (NASH): Secondary | ICD-10-CM | POA: Diagnosis not present

## 2022-07-12 DIAGNOSIS — E785 Hyperlipidemia, unspecified: Secondary | ICD-10-CM

## 2022-07-12 DIAGNOSIS — I10 Essential (primary) hypertension: Secondary | ICD-10-CM

## 2022-07-12 DIAGNOSIS — Z23 Encounter for immunization: Secondary | ICD-10-CM

## 2022-07-12 LAB — BASIC METABOLIC PANEL
BUN: 22 mg/dL (ref 6–23)
CO2: 24 mEq/L (ref 19–32)
Calcium: 9 mg/dL (ref 8.4–10.5)
Chloride: 98 mEq/L (ref 96–112)
Creatinine, Ser: 0.79 mg/dL (ref 0.40–1.50)
GFR: 93.27 mL/min (ref >60.00)
Glucose, Bld: 92 mg/dL (ref 70–99)
Potassium: 3.7 mEq/L (ref 3.5–5.1)
Sodium: 133 mEq/L — ABNORMAL LOW (ref 135–145)

## 2022-07-12 LAB — CBC WITH DIFFERENTIAL/PLATELET
Basophils Absolute: 0.1 10*3/uL (ref 0.0–0.1)
Basophils Relative: 0.5 % (ref 0.0–3.0)
Eosinophils Absolute: 0 10*3/uL (ref 0.0–0.7)
Eosinophils Relative: 0.1 % (ref 0.0–5.0)
HCT: 42.4 % (ref 39.0–52.0)
Hemoglobin: 14.5 g/dL (ref 13.0–17.0)
Lymphocytes Relative: 7.2 % — ABNORMAL LOW (ref 12.0–46.0)
Lymphs Abs: 0.8 10*3/uL (ref 0.7–4.0)
MCHC: 34.3 g/dL (ref 30.0–36.0)
MCV: 98 fl (ref 78.0–100.0)
Monocytes Absolute: 0.6 10*3/uL (ref 0.1–1.0)
Monocytes Relative: 5.1 % (ref 3.0–12.0)
Neutro Abs: 9.6 10*3/uL — ABNORMAL HIGH (ref 1.4–7.7)
Neutrophils Relative %: 87.1 % — ABNORMAL HIGH (ref 43.0–77.0)
Platelets: 275 10*3/uL (ref 150.0–400.0)
RBC: 4.33 Mil/uL (ref 4.22–5.81)
RDW: 12.8 % (ref 11.5–15.5)
WBC: 11 10*3/uL — ABNORMAL HIGH (ref 4.0–10.5)

## 2022-07-12 LAB — HEPATIC FUNCTION PANEL
ALT: 20 U/L (ref 0–53)
AST: 17 U/L (ref 0–37)
Albumin: 4.3 g/dL (ref 3.5–5.2)
Alkaline Phosphatase: 72 U/L (ref 39–117)
Bilirubin, Direct: 0.2 mg/dL (ref 0.0–0.3)
Total Bilirubin: 1 mg/dL (ref 0.2–1.2)
Total Protein: 7.2 g/dL (ref 6.0–8.3)

## 2022-07-12 LAB — LIPID PANEL
Cholesterol: 216 mg/dL — ABNORMAL HIGH (ref 0–200)
HDL: 112.2 mg/dL (ref >39.00)
LDL Cholesterol: 87 mg/dL (ref 0–99)
NonHDL: 103.78
Total CHOL/HDL Ratio: 2
Triglycerides: 83 mg/dL (ref 0.0–149.0)
VLDL: 16.6 mg/dL (ref 0.0–40.0)

## 2022-07-12 NOTE — Progress Notes (Unsigned)
Subjective:  Patient ID: Kenneth Offer Sr., male    DOB: 1956/09/24  Age: 66 y.o. MRN: [id number]  CC: No chief complaint on file.   HPI Kenneth [facility] Sr. presents for ***  Outpatient Medications Prior to Visit  Medication Sig Dispense Refill   aliskiren (TEKTURNA) 150 MG tablet TAKE 1 TABLET BY MOUTH EVERY DAY 90 tablet 0   amLODipine (NORVASC) 5 MG tablet TAKE 1 TABLET (5 MG TOTAL) BY MOUTH DAILY. 90 tablet 1   celecoxib (CELEBREX) 200 MG capsule TAKE 1 CAPSULE BY MOUTH EVERY DAY 90 capsule 0   gabapentin (NEURONTIN) 100 MG capsule TAKE 1 CAPSULE BY MOUTH THREE TIMES A DAY 270 capsule 1   icosapent Ethyl (VASCEPA) 1 g capsule TAKE 2 CAPSULES BY MOUTH TWICE A DAY 360 capsule 1   Multiple Vitamins-Minerals (CENTRUM SILVER PO) Take by mouth.     omeprazole (PRILOSEC) 20 MG capsule Take 20 mg by mouth daily.     pioglitazone (ACTOS) 15 MG tablet TAKE 1 TABLET (15 MG TOTAL) BY MOUTH DAILY. 90 tablet 0   potassium chloride SA (KLOR-CON M15) 15 MEQ tablet Take 1 tablet (15 mEq total) by mouth 2 (two) times daily. 180 tablet 0   pravastatin (PRAVACHOL) 80 MG tablet TAKE 1 TABLET BY MOUTH EVERY DAY 90 tablet 0   predniSONE (DELTASONE) 10 MG tablet Take 10 mg by mouth in the morning and at bedtime.     SPIRIVA RESPIMAT 2.5 MCG/ACT AERS INHALE 2 PUFFS BY MOUTH INTO THE LUNGS DAILY 12 g 1   No facility-administered medications prior to visit.    ROS Review of Systems  Objective:  BP 132/78 (BP Location: Left Arm, Patient Position: Sitting, Cuff Size: Large)   Pulse 77   Temp 98.3 F (36.8 C) (Oral)   Resp 16   Ht 5' 7"  (1.702 m)   Wt 197 lb (89.4 kg)   SpO2 93%   BMI 30.85 kg/m   BP Readings from Last 3 Encounters:  07/12/22 132/78  03/08/22 132/74  12/14/21 (!) 158/88    Wt Readings from Last 3 Encounters:  07/12/22 197 lb (89.4 kg)  03/08/22 200 lb (90.7 kg)  12/14/21 200 lb (90.7 kg)    Physical Exam  Lab Results  Component Value Date   WBC 6.3 05/05/2021   HGB 14.5  05/05/2021   HCT 42.6 05/05/2021   PLT 217.0 05/05/2021   GLUCOSE 84 03/08/2022   CHOL 203 (H) 05/05/2021   TRIG 163.0 (H) 05/05/2021   HDL 68.00 05/05/2021   LDLDIRECT 105.0 07/02/2019   LDLCALC 102 (H) 05/05/2021   ALT 19 05/05/2021   AST 26 05/05/2021   NA 134 (L) 03/08/2022   K 4.0 03/08/2022   CL 100 03/08/2022   CREATININE 0.87 03/08/2022   BUN 16 03/08/2022   CO2 27 03/08/2022   TSH 2.54 09/07/2021   PSA 0.38 09/07/2021   INR 0.9 07/14/2020   HGBA1C 5.0 10/09/2018    US Abdomen Limited RUQ  Result Date: 07/13/2019 CLINICAL DATA:  Elevated liver function tests. EXAM: ULTRASOUND ABDOMEN LIMITED RIGHT UPPER QUADRANT COMPARISON:  None. FINDINGS: Gallbladder: No gallstones or wall thickening visualized. No sonographic Murphy sign noted by sonographer. Common bile duct: Diameter: 4 mm which is within normal limits. Liver: No focal lesion identified. Increased echogenicity of hepatic parenchyma is noted suggesting hepatic steatosis or other diffuse hepatocellular disease. Portal vein is patent on color Doppler imaging with normal direction of blood flow towards the liver. Other:  None. IMPRESSION: Increased echogenicity of hepatic parenchyma is noted suggesting hepatic steatosis or other diffuse hepatocellular disease. Electronically Signed   By: Marijo Conception M.D.   On: 07/13/2019 13:27    Assessment & Plan:   Diagnoses and all orders for this visit:  Nonalcoholic steatohepatitis (NASH) -     Hepatic function panel; Future  Essential hypertension -     Basic metabolic panel; Future -     CBC with Differential/Platelet; Future  Hypertriglyceridemia, essential -     Lipid panel; Future -     Hepatic function panel; Future  Dyslipidemia, goal LDL below 130 -     Lipid panel; Future -     Hepatic function panel; Future   I am having Kenneth Basso. Kloosterman Sr. maintain his Multiple Vitamins-Minerals (CENTRUM SILVER PO), omeprazole, potassium chloride SA, gabapentin, pravastatin,  celecoxib, amLODipine, icosapent Ethyl, aliskiren, pioglitazone, Spiriva Respimat, and predniSONE.  No orders of the defined types were placed in this encounter.    Follow-up: Return in about 3 months (around 10/12/2022).  Kenneth Calico, MD

## 2022-07-12 NOTE — Patient Instructions (Signed)
Hypertension, Adult High blood pressure (hypertension) is when the force of blood pumping through the arteries is too strong. The arteries are the blood vessels that carry blood from the heart throughout the body. Hypertension forces the heart to work harder to pump blood and may cause arteries to become narrow or stiff. Untreated or uncontrolled hypertension can lead to a heart attack, heart failure, a stroke, kidney disease, and other problems. A blood pressure reading consists of a higher number over a lower number. Ideally, your blood pressure should be below 120/80. The first ("top") number is called the systolic pressure. It is a measure of the pressure in your arteries as your heart beats. The second ("bottom") number is called the diastolic pressure. It is a measure of the pressure in your arteries as the heart relaxes. What are the causes? The exact cause of this condition is not known. There are some conditions that result in high blood pressure. What increases the risk? Certain factors may make you more likely to develop high blood pressure. Some of these risk factors are under your control, including: Smoking. Not getting enough exercise or physical activity. Being overweight. Having too much fat, sugar, calories, or salt (sodium) in your diet. Drinking too much alcohol. Other risk factors include: Having a personal history of heart disease, diabetes, high cholesterol, or kidney disease. Stress. Having a family history of high blood pressure and high cholesterol. Having obstructive sleep apnea. Age. The risk increases with age. What are the signs or symptoms? High blood pressure may not cause symptoms. Very high blood pressure (hypertensive crisis) may cause: Headache. Fast or irregular heartbeats (palpitations). Shortness of breath. Nosebleed. Nausea and vomiting. Vision changes. Severe chest pain, dizziness, and seizures. How is this diagnosed? This condition is diagnosed by  measuring your blood pressure while you are seated, with your arm resting on a flat surface, your legs uncrossed, and your feet flat on the floor. The cuff of the blood pressure monitor will be placed directly against the skin of your upper arm at the level of your heart. Blood pressure should be measured at least twice using the same arm. Certain conditions can cause a difference in blood pressure between your right and left arms. If you have a high blood pressure reading during one visit or you have normal blood pressure with other risk factors, you may be asked to: Return on a different day to have your blood pressure checked again. Monitor your blood pressure at home for 1 week or longer. If you are diagnosed with hypertension, you may have other blood or imaging tests to help your health care provider understand your overall risk for other conditions. How is this treated? This condition is treated by making healthy lifestyle changes, such as eating healthy foods, exercising more, and reducing your alcohol intake. You may be referred for counseling on a healthy diet and physical activity. Your health care provider may prescribe medicine if lifestyle changes are not enough to get your blood pressure under control and if: Your systolic blood pressure is above 130. Your diastolic blood pressure is above 80. Your personal target blood pressure may vary depending on your medical conditions, your age, and other factors. Follow these instructions at home: Eating and drinking  Eat a diet that is high in fiber and potassium, and low in sodium, added sugar, and fat. An example of this eating plan is called the DASH diet. DASH stands for Dietary Approaches to Stop Hypertension. To eat this way: Eat   plenty of fresh fruits and vegetables. Try to fill one half of your plate at each meal with fruits and vegetables. Eat whole grains, such as whole-wheat pasta, brown rice, or whole-grain bread. Fill about one  fourth of your plate with whole grains. Eat or drink low-fat dairy products, such as skim milk or low-fat yogurt. Avoid fatty cuts of meat, processed or cured meats, and poultry with skin. Fill about one fourth of your plate with lean proteins, such as fish, chicken without skin, beans, eggs, or tofu. Avoid pre-made and processed foods. These tend to be higher in sodium, added sugar, and fat. Reduce your daily sodium intake. Many people with hypertension should eat less than 1,500 mg of sodium a day. Do not drink alcohol if: Your health care provider tells you not to drink. You are pregnant, may be pregnant, or are planning to become pregnant. If you drink alcohol: Limit how much you have to: 0-1 drink a day for women. 0-2 drinks a day for men. Know how much alcohol is in your drink. In the [place] one drink equals one 12 oz bottle of beer (355 mL), one 5 oz glass of wine (148 mL), or one 1 oz glass of hard liquor (44 mL). Lifestyle  Work with your health care provider to maintain a healthy body weight or to lose weight. Ask what an ideal weight is for you. Get at least 30 minutes of exercise that causes your heart to beat faster (aerobic exercise) most days of the week. Activities may include walking, swimming, or biking. Include exercise to strengthen your muscles (resistance exercise), such as Pilates or lifting weights, as part of your weekly exercise routine. Try to do these types of exercises for 30 minutes at least 3 days a week. Do not use any products that contain nicotine or tobacco. These products include cigarettes, chewing tobacco, and vaping devices, such as e-cigarettes. If you need help quitting, ask your health care provider. Monitor your blood pressure at home as told by your health care provider. Keep all follow-up visits. This is important. Medicines Take over-the-counter and prescription medicines only as told by your health care provider. Follow directions carefully. Blood  pressure medicines must be taken as prescribed. Do not skip doses of blood pressure medicine. Doing this puts you at risk for problems and can make the medicine less effective. Ask your health care provider about side effects or reactions to medicines that you should watch for. Contact a health care provider if you: Think you are having a reaction to a medicine you are taking. Have headaches that keep coming back (recurring). Feel dizzy. Have swelling in your ankles. Have trouble with your vision. Get help right away if you: Develop a severe headache or confusion. Have unusual weakness or numbness. Feel faint. Have severe pain in your chest or abdomen. Vomit repeatedly. Have trouble breathing. These symptoms may be an emergency. Get help right away. Call 911. Do not wait to see if the symptoms will go away. Do not drive yourself to the hospital. Summary Hypertension is when the force of blood pumping through your arteries is too strong. If this condition is not controlled, it may put you at risk for serious complications. Your personal target blood pressure may vary depending on your medical conditions, your age, and other factors. For most people, a normal blood pressure is less than 120/80. Hypertension is treated with lifestyle changes, medicines, or a combination of both. Lifestyle changes include losing weight, eating a healthy,   low-sodium diet, exercising more, and limiting alcohol. This information is not intended to replace advice given to you by your health care provider. Make sure you discuss any questions you have with your health care provider. Document Revised: 09/22/2021 Document Reviewed: 09/22/2021 [employer]vier Patient Education  2023 [employer]  

## 2022-08-14 ENCOUNTER — Other Ambulatory Visit: Payer: Self-pay | Admitting: Internal Medicine

## 2022-08-14 DIAGNOSIS — E785 Hyperlipidemia, unspecified: Secondary | ICD-10-CM

## 2022-08-29 ENCOUNTER — Other Ambulatory Visit: Payer: Self-pay | Admitting: Internal Medicine

## 2022-08-29 DIAGNOSIS — G8929 Other chronic pain: Secondary | ICD-10-CM

## 2022-08-29 DIAGNOSIS — M159 Polyosteoarthritis, unspecified: Secondary | ICD-10-CM

## 2022-09-12 ENCOUNTER — Other Ambulatory Visit: Payer: Self-pay | Admitting: Internal Medicine

## 2022-09-12 DIAGNOSIS — G8929 Other chronic pain: Secondary | ICD-10-CM

## 2022-09-12 DIAGNOSIS — I10 Essential (primary) hypertension: Secondary | ICD-10-CM

## 2022-10-05 ENCOUNTER — Other Ambulatory Visit: Payer: Self-pay | Admitting: Internal Medicine

## 2022-10-05 DIAGNOSIS — K7581 Nonalcoholic steatohepatitis (NASH): Secondary | ICD-10-CM

## 2022-10-18 ENCOUNTER — Encounter: Payer: Self-pay | Admitting: Internal Medicine

## 2022-10-18 ENCOUNTER — Ambulatory Visit (INDEPENDENT_AMBULATORY_CARE_PROVIDER_SITE_OTHER): Payer: Medicare Other | Admitting: Internal Medicine

## 2022-10-18 VITALS — BP 132/84 | HR 81 | Temp 98.2°F | Resp 16 | Ht 67.0 in | Wt 196.0 lb

## 2022-10-18 DIAGNOSIS — N400 Enlarged prostate without lower urinary tract symptoms: Secondary | ICD-10-CM | POA: Diagnosis not present

## 2022-10-18 DIAGNOSIS — J4489 Other specified chronic obstructive pulmonary disease: Secondary | ICD-10-CM | POA: Diagnosis not present

## 2022-10-18 DIAGNOSIS — I10 Essential (primary) hypertension: Secondary | ICD-10-CM | POA: Diagnosis not present

## 2022-10-18 LAB — CBC WITH DIFFERENTIAL/PLATELET
Basophils Absolute: 0 10*3/uL (ref 0.0–0.1)
Basophils Relative: 0.9 % (ref 0.0–3.0)
Eosinophils Absolute: 0.2 10*3/uL (ref 0.0–0.7)
Eosinophils Relative: 3.2 % (ref 0.0–5.0)
HCT: 44 % (ref 39.0–52.0)
Hemoglobin: 14.9 g/dL (ref 13.0–17.0)
Lymphocytes Relative: 20.9 % (ref 12.0–46.0)
Lymphs Abs: 1.1 10*3/uL (ref 0.7–4.0)
MCHC: 33.8 g/dL (ref 30.0–36.0)
MCV: 98.5 fl (ref 78.0–100.0)
Monocytes Absolute: 0.6 10*3/uL (ref 0.1–1.0)
Monocytes Relative: 10.3 % (ref 3.0–12.0)
Neutro Abs: 3.6 10*3/uL (ref 1.4–7.7)
Neutrophils Relative %: 64.7 % (ref 43.0–77.0)
Platelets: 272 10*3/uL (ref 150.0–400.0)
RBC: 4.47 Mil/uL (ref 4.22–5.81)
RDW: 12.4 % (ref 11.5–15.5)
WBC: 5.5 10*3/uL (ref 4.0–10.5)

## 2022-10-18 LAB — PSA: PSA: 0.26 ng/mL (ref 0.10–4.00)

## 2022-10-18 LAB — TSH: TSH: 3.08 u[IU]/mL (ref 0.35–5.50)

## 2022-10-18 MED ORDER — TRELEGY ELLIPTA 100-62.5-25 MCG/ACT IN AEPB: 1.0000 | INHALATION_SPRAY | Freq: Every day | RESPIRATORY_TRACT | 0 refills | Status: DC

## 2022-10-18 NOTE — Patient Instructions (Signed)
Chronic Obstructive Pulmonary Disease  Chronic obstructive pulmonary disease (COPD) is a long-term (chronic) condition that affects the lungs. COPD is a general term that can be used to describe many different lung problems that cause lung inflammation and limit airflow, including chronic bronchitis and emphysema. If you have COPD, your lung function will probably never return to normal. In most cases, it gets worse over time. However, there are steps you can take to slow the progression of the disease and improve your quality of life. What are the causes? This condition may be caused by: Smoking. This is the most common cause. Certain genes passed down through families. What increases the risk? The following factors may make you more likely to develop this condition: Being exposed to secondhand smoke from cigarettes, pipes, or cigars. Being exposed to chemicals and other irritants, such as fumes and dust in the work environment. Having chronic lung conditions or infections. What are the signs or symptoms? Symptoms of this condition include: Shortness of breath, especially during physical activity. Chronic cough with a large amount of thick mucus. Sometimes, the cough may not have any mucus (dry cough). Wheezing and rapid breathing. Gray or bluish discoloration (cyanosis) of the skin, especially in the fingers, toes, or lips. Feeling tired (fatigue). Weight loss. Chest tightness. Frequent infections. Episodes when breathing symptoms become much worse (exacerbations). At the later stages of this disease, you may have swelling in the ankles, feet, or legs. How is this diagnosed? This condition is diagnosed based on: Your medical history. A physical exam. You may also have tests, including: Lung (pulmonary) function tests. This may include a spirometry test, which measures your ability to exhale properly. Chest X-ray. CT scan. Blood tests. How is this treated? This condition may be  treated with: Medicines. These may include inhaled rescue medicines to treat acute exacerbations as well as medicines that you take long-term (maintenance medicines) to prevent flare-ups of COPD. Bronchodilators help treat COPD by dilating the airways to allow increased airflow and make your breathing more comfortable. Steroids can reduce airway inflammation and help prevent exacerbations. Smoking cessation. If you smoke, your health care provider may ask you to quit, and may also recommend therapy or replacement products to help you quit. Pulmonary rehabilitation. This may involve working with a team of health care providers and specialists, such as respiratory, occupational, and physical therapists. Exercise and physical activity. These are beneficial for nearly all people with COPD. Nutrition therapy to gain weight, if you are underweight. Oxygen. Supplemental oxygen therapy is only helpful if you have a low oxygen level in your blood (hypoxemia). Lung surgery or transplant. Palliative care. This is to help people with COPD feel comfortable when treatment is no longer working. Follow these instructions at home: Medicines Take over-the-counter and prescription medicines only as told by your health care provider. This includes inhaled medicines and pills. Talk to your health care provider before taking any cough or allergy medicines. You may need to avoid certain medicines that dry out your airways. Lifestyle If you smoke, the most important thing that you can do is to stop smoking. Continuing to smoke will cause the disease to progress faster. Do not use any products that contain nicotine or tobacco. These products include cigarettes, chewing tobacco, and vaping devices, such as e-cigarettes. If you need help quitting, ask your health care provider. Avoid exposure to things that irritate your lungs, such as smoke, chemicals, and fumes. Stay active, but balance activity with periods of rest.  Exercise and  physical activity will help you maintain your ability to do things you want to do. Learn and use relaxation techniques to manage stress and to control your breathing. Get the right amount of sleep and get quality sleep. Most adults need 7 or more hours per night. Eat healthy foods. Eating smaller, more frequent meals and resting before meals may help you maintain your strength. Controlled breathing Learn and use controlled breathing techniques as directed by your health care provider. Controlled breathing techniques include: Pursed lip breathing. Start by breathing in (inhaling) through your nose for 1 second. Then, purse your lips as if you were going to whistle and breathe out (exhale) through the pursed lips for 2 seconds. Diaphragmatic breathing. Start by putting one hand on your abdomen just above your waist. Inhale slowly through your nose. The hand on your abdomen should move out. Then purse your lips and exhale slowly. You should be able to feel the hand on your abdomen moving in as you exhale.  Controlled coughing Learn and use controlled coughing to clear mucus from your lungs. Controlled coughing is a series of short, progressive coughs. The steps of controlled coughing are: Lean your head slightly forward. Breathe in deeply using diaphragmatic breathing. Try to hold your breath for 3 seconds. Keep your mouth slightly open while coughing twice. Spit any mucus out into a tissue. Rest and repeat the steps once or twice as needed. General instructions Make sure you receive all the vaccines that your health care provider recommends, especially the pneumococcal and influenza vaccines. Preventing infection and hospitalization is very important when you have COPD. Drink enough fluid to keep your urine pale yellow, unless you have a medical condition that requires fluid restriction. Use oxygen therapy and pulmonary rehabilitation if told by your health care provider. If you  require home oxygen therapy, ask your health care provider whether you should purchase a pulse oximeter to measure your oxygen level at home. Work with your health care provider to develop a COPD action plan. This will help you know what steps to take if your condition gets worse. Keep other chronic health conditions under control as told by your health care provider. Avoid extreme temperature and humidity changes. Avoid contact with people who have an illness that spreads from person to person (is contagious), such as viral infections or pneumonia. Keep all follow-up visits. This is important. Contact a health care provider if: You are coughing up more mucus than usual. There is a change in the color or thickness of your mucus. Your breathing is more labored than usual. Your breathing is faster than usual. You have difficulty sleeping. You need to use your rescue medicines or inhalers more often than expected. You have trouble doing routine activities such as getting dressed or walking around the house. Get help right away if: You have shortness of breath while you are resting. You have shortness of breath that prevents you from: Being able to talk. Performing your usual physical activities. You have chest pain lasting longer than 5 minutes. Your skin color is more blue (cyanotic) than usual. You measure low oxygen saturations for longer than 5 minutes with a pulse oximeter. You have a fever. You feel too tired to breathe normally. These symptoms may represent a serious problem that is an emergency. Do not wait to see if the symptoms will go away. Get medical help right away. Call your local emergency services (911 in the [place] Do not drive yourself to the hospital. Summary Chronic obstructive  pulmonary disease (COPD) is a long-term (chronic) condition that affects the lungs. Your lung function will probably never return to normal. In most cases, it gets worse over time. However, there  are steps you can take to slow the progression of the disease and improve your quality of life. Treatment for COPD may include taking medicines, quitting smoking, pulmonary rehabilitation, and changes to diet and exercise. As the disease progresses, you may need oxygen therapy, a lung transplant, or palliative care. To help manage your condition, do not smoke, avoid exposure to things that irritate your lungs, stay up to date on all vaccines, and follow your health care provider's instructions for taking medicines. This information is not intended to replace advice given to you by your health care provider. Make sure you discuss any questions you have with your health care provider. Document Revised: 09/23/2020 Document Reviewed: 09/23/2020 Elsevier Patient Education  [place].

## 2022-10-18 NOTE — Progress Notes (Unsigned)
Subjective:  Patient ID: Kenneth Offer Sr., male    DOB: [birth year]  Age: 66 y.o. MRN: [id number]  CC: Hypertension and COPD   HPI Kenneth Singleton [facility] Sr. presents for f/up -  He is active and denies chest pain, diaphoresis, dyspnea on exertion, or edema.  He has rare episodes of wheezing but denies cough.  He tells me that Vascepa and Spiriva are too expensive.  Outpatient Medications Prior to Visit  Medication Sig Dispense Refill   Carboxymethylcellulose Sodium (EYE DROPS OP) Apply to eye. Eye Promise Brand     celecoxib (CELEBREX) 200 MG capsule TAKE 1 CAPSULE BY MOUTH EVERY DAY 90 capsule 0   gabapentin (NEURONTIN) 100 MG capsule TAKE 1 CAPSULE BY MOUTH THREE TIMES A DAY 270 capsule 1   Multiple Vitamins-Minerals (CENTRUM SILVER PO) Take by mouth.     omeprazole (PRILOSEC) 20 MG capsule Take 20 mg by mouth daily.     pioglitazone (ACTOS) 15 MG tablet TAKE 1 TABLET (15 MG TOTAL) BY MOUTH DAILY. 90 tablet 0   pravastatin (PRAVACHOL) 80 MG tablet TAKE 1 TABLET BY MOUTH EVERY DAY 90 tablet 0   predniSONE (DELTASONE) 10 MG tablet Take 10 mg by mouth in the morning and at bedtime.     aliskiren (TEKTURNA) 150 MG tablet TAKE 1 TABLET BY MOUTH EVERY DAY 90 tablet 0   amLODipine (NORVASC) 5 MG tablet TAKE 1 TABLET (5 MG TOTAL) BY MOUTH DAILY. 90 tablet 1   icosapent Ethyl (VASCEPA) 1 g capsule TAKE 2 CAPSULES BY MOUTH TWICE A DAY 360 capsule 1   potassium chloride SA (KLOR-CON M15) 15 MEQ tablet Take 1 tablet (15 mEq total) by mouth 2 (two) times daily. 180 tablet 0   SPIRIVA RESPIMAT 2.5 MCG/ACT AERS INHALE 2 PUFFS BY MOUTH INTO THE LUNGS DAILY 12 g 1   No facility-administered medications prior to visit.    ROS Review of Systems  Constitutional:  Negative for diaphoresis, fatigue and unexpected weight change.  HENT: Negative.    Eyes: Negative.   Respiratory:  Positive for wheezing. Negative for cough, choking and shortness of breath.   Cardiovascular:  Negative for chest pain, palpitations  and leg swelling.  Gastrointestinal:  Negative for abdominal pain, constipation, diarrhea, nausea and vomiting.  Endocrine: Negative.   Genitourinary: Negative.  Negative for difficulty urinating and dysuria.  Musculoskeletal: Negative.  Negative for myalgias.  Skin: Negative.   Neurological: Negative.  Negative for dizziness and weakness.  Hematological:  Negative for adenopathy. Does not bruise/bleed easily.  Psychiatric/Behavioral: Negative.      Objective:  BP 132/84 (BP Location: Left Arm, Patient Position: Sitting, Cuff Size: Large)   Pulse 81   Temp 98.2 F (36.8 C) (Oral)   Resp 16   Ht 5' 7"  (1.702 m)   Wt 196 lb (88.9 kg)   SpO2 97%   BMI 30.70 kg/m   BP Readings from Last 3 Encounters:  10/18/22 132/84  07/12/22 132/78  03/08/22 132/74    Wt Readings from Last 3 Encounters:  10/18/22 196 lb (88.9 kg)  07/12/22 197 lb (89.4 kg)  03/08/22 200 lb (90.7 kg)    Physical Exam Vitals reviewed.  HENT:     Mouth/Throat:     Mouth: Mucous membranes are moist.  Eyes:     General: No scleral icterus.    Conjunctiva/sclera: Conjunctivae normal.  Cardiovascular:     Rate and Rhythm: Normal rate and regular rhythm.     Heart sounds: S1 normal  and S2 normal. No murmur heard.    No gallop.     Comments: EKG- NSR, 75 bpm No LVH Normal EKG Pulmonary:     Effort: Pulmonary effort is normal.     Breath sounds: No stridor. No wheezing, rhonchi or rales.  Abdominal:     General: Abdomen is flat.     Palpations: There is no mass.     Tenderness: There is no abdominal tenderness. There is no guarding.     Hernia: No hernia is present. There is no hernia in the left inguinal area or right inguinal area.  Genitourinary:    Pubic Area: No rash.      Penis: Normal and circumcised.      Testes: Normal.     Epididymis:     Right: Normal.     Left: Normal.     Prostate: Enlarged. Not tender and no nodules present.     Rectum: Normal. Guaiac result negative. No mass,  tenderness, anal fissure, external hemorrhoid or internal hemorrhoid. Normal anal tone.  Musculoskeletal:     Cervical back: Neck supple.     Right lower leg: No edema.     Left lower leg: No edema.  Lymphadenopathy:     Lower Body: No right inguinal adenopathy. No left inguinal adenopathy.  Skin:    General: Skin is warm and dry.     Findings: No lesion.  Neurological:     General: No focal deficit present.     Mental Status: He is alert. Mental status is at baseline.  Psychiatric:        Mood and Affect: Mood normal.        Behavior: Behavior normal.     Lab Results  Component Value Date   WBC 5.5 10/18/2022   HGB 14.9 10/18/2022   HCT 44.0 10/18/2022   PLT 272.0 10/18/2022   GLUCOSE 95 10/18/2022   CHOL 216 (H) 07/12/2022   TRIG 83.0 07/12/2022   HDL 112.20 07/12/2022   LDLDIRECT 105.0 07/02/2019   LDLCALC 87 07/12/2022   ALT 20 07/12/2022   AST 17 07/12/2022   NA 135 10/18/2022   K 4.8 10/18/2022   CL 99 10/18/2022   CREATININE 0.92 10/18/2022   BUN 12 10/18/2022   CO2 27 10/18/2022   TSH 3.08 10/18/2022   PSA 0.26 10/18/2022   INR 0.9 07/14/2020   HGBA1C 5.0 10/09/2018    US Abdomen Limited RUQ  Result Date: 07/13/2019 CLINICAL DATA:  Elevated liver function tests. EXAM: ULTRASOUND ABDOMEN LIMITED RIGHT UPPER QUADRANT COMPARISON:  None. FINDINGS: Gallbladder: No gallstones or wall thickening visualized. No sonographic Murphy sign noted by sonographer. Common bile duct: Diameter: 4 mm which is within normal limits. Liver: No focal lesion identified. Increased echogenicity of hepatic parenchyma is noted suggesting hepatic steatosis or other diffuse hepatocellular disease. Portal vein is patent on color Doppler imaging with normal direction of blood flow towards the liver. Other: None. IMPRESSION: Increased echogenicity of hepatic parenchyma is noted suggesting hepatic steatosis or other diffuse hepatocellular disease. Electronically Signed   By: Marijo Conception M.D.    On: 07/13/2019 13:27    Assessment & Plan:   Oluwadamilare was seen today for hypertension and copd.  Diagnoses and all orders for this visit:  Essential hypertension- His blood pressure is well-controlled.  EKG is reassuring.  Will continue the current antihypertensives. -     EKG 12-Lead -     TSH; Future -     CBC with Differential/Platelet;  Future -     Basic metabolic panel; Future -     Basic metabolic panel -     CBC with Differential/Platelet -     TSH -     amLODipine (NORVASC) 5 MG tablet; Take 1 tablet (5 mg total) by mouth daily. -     aliskiren (TEKTURNA) 150 MG tablet; Take 1 tablet (150 mg total) by mouth daily.  COPD (chronic obstructive pulmonary disease) with chronic bronchitis ([place])- Will change to Trelegy. -     Fluticasone-Umeclidin-Vilant (TRELEGY ELLIPTA) 100-62.5-25 MCG/ACT AEPB; Inhale 1 puff into the lungs daily. -     CBC with Differential/Platelet; Future -     CBC with Differential/Platelet  Benign prostatic hyperplasia without lower urinary tract symptoms -     PSA; Future -     PSA   I have discontinued Tiny Rietz. Fotopoulos Sr.'s potassium chloride SA, icosapent Ethyl, and Spiriva Respimat. I have also changed his aliskiren. Additionally, I am having him start on Trelegy Ellipta. Lastly, I am having him maintain his Multiple Vitamins-Minerals (CENTRUM SILVER PO), omeprazole, predniSONE, pravastatin, celecoxib, gabapentin, pioglitazone, Carboxymethylcellulose Sodium (EYE DROPS OP), and amLODipine.  Meds ordered this encounter  Medications   Fluticasone-Umeclidin-Vilant (TRELEGY ELLIPTA) 100-62.5-25 MCG/ACT AEPB    Sig: Inhale 1 puff into the lungs daily.    Dispense:  120 each    Refill:  0   amLODipine (NORVASC) 5 MG tablet    Sig: Take 1 tablet (5 mg total) by mouth daily.    Dispense:  90 tablet    Refill:  1   aliskiren (TEKTURNA) 150 MG tablet    Sig: Take 1 tablet (150 mg total) by mouth daily.    Dispense:  90 tablet    Refill:  1     Follow-up:  Return in about 6 months (around 04/18/2023).  Scarlette Calico, MD

## 2022-10-19 LAB — BASIC METABOLIC PANEL
BUN: 12 mg/dL (ref 6–23)
CO2: 27 mEq/L (ref 19–32)
Calcium: 9.2 mg/dL (ref 8.4–10.5)
Chloride: 99 mEq/L (ref 96–112)
Creatinine, Ser: 0.92 mg/dL (ref 0.40–1.50)
GFR: 87.17 mL/min (ref >60.00)
Glucose, Bld: 95 mg/dL (ref 70–99)
Potassium: 4.8 mEq/L (ref 3.5–5.1)
Sodium: 135 mEq/L (ref 135–145)

## 2022-10-20 MED ORDER — AMLODIPINE BESYLATE 5 MG PO TABS: 5.0000 mg | ORAL_TABLET | Freq: Every day | ORAL | 1 refills | Status: DC

## 2022-10-20 MED ORDER — ALISKIREN FUMARATE 150 MG PO TABS: 150.0000 mg | ORAL_TABLET | Freq: Every day | ORAL | 1 refills | Status: DC

## 2022-11-18 ENCOUNTER — Other Ambulatory Visit: Payer: Self-pay | Admitting: Internal Medicine

## 2022-11-18 DIAGNOSIS — E785 Hyperlipidemia, unspecified: Secondary | ICD-10-CM

## 2022-11-28 ENCOUNTER — Other Ambulatory Visit: Payer: Self-pay | Admitting: Internal Medicine

## 2022-11-28 DIAGNOSIS — G8929 Other chronic pain: Secondary | ICD-10-CM

## 2022-11-28 DIAGNOSIS — M159 Polyosteoarthritis, unspecified: Secondary | ICD-10-CM

## 2022-12-06 ENCOUNTER — Telehealth: Payer: Self-pay | Admitting: Internal Medicine

## 2022-12-06 ENCOUNTER — Other Ambulatory Visit: Payer: Self-pay | Admitting: Internal Medicine

## 2022-12-06 DIAGNOSIS — I10 Essential (primary) hypertension: Secondary | ICD-10-CM

## 2022-12-06 NOTE — Telephone Encounter (Signed)
Patient's wife called and stated that back in November Dr. Ronnald Ramp said he was going to replace the medication aliskiren (TEKTURNA) 150 MG tablet to something else that can be possibly cheaper. But nothing was ever sent in and they were never notified. Patient also wants just a call back from the nurse to further explain his medications and why some were stopped. Best callback number is [phone].

## 2022-12-06 NOTE — Telephone Encounter (Signed)
Kenneth Singleton, pt's wife, stated he can not afford the co-pay for Aliskiren Marisa Severin). Insurance informed them that all meds that in in "-pril" would be covered.   Trelegy co-pay is also to high for the pt and she mentioned that Judithann Sauger is covered.    She does not want the pt to go without due to co-pay cost but she is unsure what to do. Please advise.

## 2022-12-07 ENCOUNTER — Other Ambulatory Visit: Payer: Self-pay | Admitting: Internal Medicine

## 2022-12-07 DIAGNOSIS — I10 Essential (primary) hypertension: Secondary | ICD-10-CM

## 2022-12-07 DIAGNOSIS — J4489 Other specified chronic obstructive pulmonary disease: Secondary | ICD-10-CM

## 2022-12-07 MED ORDER — LISINOPRIL 20 MG PO TABS: 20.0000 mg | ORAL_TABLET | Freq: Every day | ORAL | 1 refills | Status: DC

## 2022-12-07 MED ORDER — BREZTRI AEROSPHERE 160-9-4.8 MCG/ACT IN AERO: 2.0000 | INHALATION_SPRAY | Freq: Two times a day (BID) | RESPIRATORY_TRACT | 1 refills | Status: DC

## 2022-12-07 NOTE — Telephone Encounter (Signed)
LVM for Kenneth Singleton for clarification on what pharmacy they use.

## 2022-12-07 NOTE — Telephone Encounter (Signed)
[facility] on [address]

## 2022-12-09 ENCOUNTER — Other Ambulatory Visit: Payer: Self-pay | Admitting: Internal Medicine

## 2022-12-09 DIAGNOSIS — E781 Pure hyperglyceridemia: Secondary | ICD-10-CM

## 2023-01-02 ENCOUNTER — Other Ambulatory Visit: Payer: Self-pay | Admitting: Internal Medicine

## 2023-01-02 DIAGNOSIS — K7581 Nonalcoholic steatohepatitis (NASH): Secondary | ICD-10-CM

## 2023-02-23 ENCOUNTER — Other Ambulatory Visit: Payer: Self-pay | Admitting: Internal Medicine

## 2023-02-23 DIAGNOSIS — G8929 Other chronic pain: Secondary | ICD-10-CM

## 2023-02-23 DIAGNOSIS — M159 Polyosteoarthritis, unspecified: Secondary | ICD-10-CM

## 2023-03-05 ENCOUNTER — Other Ambulatory Visit: Payer: Self-pay | Admitting: Internal Medicine

## 2023-03-05 DIAGNOSIS — E781 Pure hyperglyceridemia: Secondary | ICD-10-CM

## 2023-03-27 ENCOUNTER — Other Ambulatory Visit: Payer: Self-pay | Admitting: Internal Medicine

## 2023-03-27 DIAGNOSIS — M159 Polyosteoarthritis, unspecified: Secondary | ICD-10-CM

## 2023-03-27 DIAGNOSIS — E785 Hyperlipidemia, unspecified: Secondary | ICD-10-CM

## 2023-03-27 DIAGNOSIS — G8929 Other chronic pain: Secondary | ICD-10-CM

## 2023-03-29 ENCOUNTER — Other Ambulatory Visit: Payer: Self-pay | Admitting: Internal Medicine

## 2023-03-29 DIAGNOSIS — K7581 Nonalcoholic steatohepatitis (NASH): Secondary | ICD-10-CM

## 2023-04-18 ENCOUNTER — Encounter: Payer: Self-pay | Admitting: Internal Medicine

## 2023-04-18 ENCOUNTER — Ambulatory Visit (INDEPENDENT_AMBULATORY_CARE_PROVIDER_SITE_OTHER): Payer: Medicare Other | Admitting: Internal Medicine

## 2023-04-18 VITALS — BP 120/66 | HR 87 | Temp 98.5°F | Ht 67.0 in | Wt 201.0 lb

## 2023-04-18 DIAGNOSIS — S20469A Insect bite (nonvenomous) of unspecified back wall of thorax, initial encounter: Secondary | ICD-10-CM

## 2023-04-18 DIAGNOSIS — L247 Irritant contact dermatitis due to plants, except food: Secondary | ICD-10-CM | POA: Diagnosis not present

## 2023-04-18 DIAGNOSIS — W57XXXA Bitten or stung by nonvenomous insect and other nonvenomous arthropods, initial encounter: Secondary | ICD-10-CM | POA: Diagnosis not present

## 2023-04-18 DIAGNOSIS — I10 Essential (primary) hypertension: Secondary | ICD-10-CM

## 2023-04-18 DIAGNOSIS — T783XXA Angioneurotic edema, initial encounter: Secondary | ICD-10-CM | POA: Insufficient documentation

## 2023-04-18 DIAGNOSIS — K7581 Nonalcoholic steatohepatitis (NASH): Secondary | ICD-10-CM

## 2023-04-18 LAB — CBC WITH DIFFERENTIAL/PLATELET
Basophils Absolute: 0.1 10*3/uL (ref 0.0–0.1)
Basophils Relative: 1 % (ref 0.0–3.0)
Eosinophils Absolute: 0.1 10*3/uL (ref 0.0–0.7)
Eosinophils Relative: 2.2 % (ref 0.0–5.0)
HCT: 40.8 % (ref 39.0–52.0)
Hemoglobin: 14.2 g/dL (ref 13.0–17.0)
Lymphocytes Relative: 14.4 % (ref 12.0–46.0)
Lymphs Abs: 0.9 10*3/uL (ref 0.7–4.0)
MCHC: 34.7 g/dL (ref 30.0–36.0)
MCV: 97.1 fl (ref 78.0–100.0)
Monocytes Absolute: 0.6 10*3/uL (ref 0.1–1.0)
Monocytes Relative: 9.3 % (ref 3.0–12.0)
Neutro Abs: 4.4 10*3/uL (ref 1.4–7.7)
Neutrophils Relative %: 73.1 % (ref 43.0–77.0)
Platelets: 286 10*3/uL (ref 150.0–400.0)
RBC: 4.2 Mil/uL — ABNORMAL LOW (ref 4.22–5.81)
RDW: 12.7 % (ref 11.5–15.5)
WBC: 6 10*3/uL (ref 4.0–10.5)

## 2023-04-18 LAB — BASIC METABOLIC PANEL
BUN: 7 mg/dL (ref 6–23)
CO2: 27 mEq/L (ref 19–32)
Calcium: 9.3 mg/dL (ref 8.4–10.5)
Chloride: 99 mEq/L (ref 96–112)
Creatinine, Ser: 0.82 mg/dL (ref 0.40–1.50)
GFR: 91.73 mL/min (ref >60.00)
Glucose, Bld: 87 mg/dL (ref 70–99)
Potassium: 4.2 mEq/L (ref 3.5–5.1)
Sodium: 135 mEq/L (ref 135–145)

## 2023-04-18 LAB — C-REACTIVE PROTEIN: CRP: 2.3 mg/dL (ref 0.5–20.0)

## 2023-04-18 MED ORDER — METHYLPREDNISOLONE ACETATE 80 MG/ML IJ SUSP
120.0000 mg | Freq: Once | INTRAMUSCULAR | Status: AC
Start: 2023-04-18 — End: 2023-04-18
  Administered 2023-04-18: 120 mg via INTRAMUSCULAR

## 2023-04-18 MED ORDER — FLUOCINONIDE EMULSIFIED BASE 0.05 % EX CREA
1.0000 | TOPICAL_CREAM | Freq: Two times a day (BID) | CUTANEOUS | 1 refills | Status: DC
Start: 2023-04-18 — End: 2023-08-06

## 2023-04-18 MED ORDER — HYDROXYZINE PAMOATE 25 MG PO CAPS
25.0000 mg | ORAL_CAPSULE | Freq: Three times a day (TID) | ORAL | 0 refills | Status: DC | PRN
Start: 2023-04-18 — End: 2023-05-09

## 2023-04-18 NOTE — Progress Notes (Signed)
Subjective:  Patient ID: Kenneth Moor Sr., male    DOB: 24-Feb-1956  Age: 67 y.o. MRN: [id number]  CC: Rash and Hypertension   HPI Kenneth Legall [facility] Sr. presents for f/up   ---  He complains of a one week history of itchy rash on his torso, tick bites, and two week history of lower lip swelling. He has not had trouble breathing.  He has been exposed to poison oak.  Outpatient Medications Prior to Visit  Medication Sig Dispense Refill   amLODipine (NORVASC) 5 MG tablet Take 1 tablet (5 mg total) by mouth daily. 90 tablet 1   Carboxymethylcellulose Sodium (EYE DROPS OP) Apply to eye. Eye Promise Brand     celecoxib (CELEBREX) 200 MG capsule TAKE 1 CAPSULE BY MOUTH EVERY DAY (NEED PRIMARY INS) 90 capsule 0   gabapentin (NEURONTIN) 100 MG capsule TAKE 1 CAPSULE BY MOUTH THREE TIMES A DAY 270 capsule 1   Multiple Vitamins-Minerals (CENTRUM SILVER PO) Take by mouth.     omeprazole (PRILOSEC) 20 MG capsule Take 20 mg by mouth daily.     pravastatin (PRAVACHOL) 80 MG tablet TAKE 1 TABLET BY MOUTH EVERY DAY 90 tablet 0   lisinopril (ZESTRIL) 20 MG tablet Take 1 tablet (20 mg total) by mouth daily. 90 tablet 1   pioglitazone (ACTOS) 15 MG tablet Take 1 tablet (15 mg total) by mouth daily. Follow-up due in may must see provider for refills 30 tablet 0   Budeson-Glycopyrrol-Formoterol (BREZTRI AEROSPHERE) 160-9-4.8 MCG/ACT AERO Inhale 2 puffs into the lungs 2 (two) times daily. 64.2 g 1   predniSONE (DELTASONE) 10 MG tablet Take 10 mg by mouth in the morning and at bedtime.     No facility-administered medications prior to visit.    ROS Review of Systems  Constitutional: Negative.  Negative for chills, diaphoresis, fatigue and fever.  HENT: Negative.  Negative for facial swelling and trouble swallowing.   Eyes: Negative.  Negative for visual disturbance.  Respiratory: Negative.  Negative for cough, chest tightness, shortness of breath and wheezing.   Cardiovascular:   Negative for chest pain, palpitations and leg swelling.  Gastrointestinal:  Negative for abdominal pain, constipation, diarrhea, nausea and vomiting.  Endocrine: Negative.   Genitourinary: Negative.  Negative for difficulty urinating.  Musculoskeletal:  Negative for arthralgias, back pain and myalgias.  Skin:  Positive for color change and rash.  Neurological:  Negative for dizziness and weakness.  Hematological:  Negative for adenopathy. Does not bruise/bleed easily.  Psychiatric/Behavioral: Negative.      Objective:  BP 120/66 (BP Location: Left Arm, Patient Position: Sitting, Cuff Size: Large)   Pulse 87   Temp 98.5 F (36.9 C) (Oral)   Ht 5\' 7"  (1.702 m)   Wt 201 lb (91.2 kg)   SpO2 96%   BMI 31.48 kg/m   BP Readings from Last 3 Encounters:  04/18/23 120/66  10/18/22 132/84  07/12/22 132/78    Wt Readings from Last 3 Encounters:  04/18/23 201 lb (91.2 kg)  10/18/22 196 lb (88.9 kg)  07/12/22 197 lb (89.4 kg)    Physical Exam Vitals reviewed.  Constitutional:      Appearance: He is not ill-appearing.  HENT:     Nose: Nose normal.     Mouth/Throat:     Mouth: Mucous membranes are moist.  Eyes:     General: No scleral icterus.    Conjunctiva/sclera: Conjunctivae  normal.  Cardiovascular:     Rate and Rhythm: Normal rate and regular rhythm.     Heart sounds: No murmur heard. Pulmonary:     Effort: Pulmonary effort is normal.     Breath sounds: No wheezing, rhonchi or rales.  Abdominal:     Palpations: There is no mass.     Tenderness: There is no abdominal tenderness. There is no guarding.     Hernia: No hernia is present.  Musculoskeletal:        General: No swelling. Normal range of motion.     Cervical back: Neck supple.     Right lower leg: No edema.     Left lower leg: No edema.  Lymphadenopathy:     Cervical: No cervical adenopathy.  Skin:    Coloration: Skin is not pale.     Findings: Erythema, lesion and rash present. No abscess, bruising,  ecchymosis or petechiae. Rash is macular, papular and scaling. Rash is not crusting, nodular, purpuric, pustular, urticarial or vesicular.     Comments: Scattered over the torso there are several erythematous macules with excoriations and scabs.  There are no foreign bodies.  There are areas of excoriation.  There are erythematous macules and papules with scale.  Neurological:     General: No focal deficit present.     Mental Status: He is alert. Mental status is at baseline.  Psychiatric:        Mood and Affect: Mood normal.        Behavior: Behavior normal.     Lab Results  Component Value Date   WBC 6.0 04/18/2023   HGB 14.2 04/18/2023   HCT 40.8 04/18/2023   PLT 286.0 04/18/2023   GLUCOSE 87 04/18/2023   CHOL 216 (H) 07/12/2022   TRIG 83.0 07/12/2022   HDL 112.20 07/12/2022   LDLDIRECT 105.0 07/02/2019   LDLCALC 87 07/12/2022   ALT 20 07/12/2022   AST 17 07/12/2022   NA 135 04/18/2023   K 4.2 04/18/2023   CL 99 04/18/2023   CREATININE 0.82 04/18/2023   BUN 7 04/18/2023   CO2 27 04/18/2023   TSH 3.08 10/18/2022   PSA 0.26 10/18/2022   INR 0.9 07/14/2020   HGBA1C 5.0 10/09/2018    US Abdomen Limited RUQ  Result Date: 07/13/2019 CLINICAL DATA:  Elevated liver function tests. EXAM: ULTRASOUND ABDOMEN LIMITED RIGHT UPPER QUADRANT COMPARISON:  None. FINDINGS: Gallbladder: No gallstones or wall thickening visualized. No sonographic Murphy sign noted by sonographer. Common bile duct: Diameter: 4 mm which is within normal limits. Liver: No focal lesion identified. Increased echogenicity of hepatic parenchyma is noted suggesting hepatic steatosis or other diffuse hepatocellular disease. Portal vein is patent on color Doppler imaging with normal direction of blood flow towards the liver. Other: None. IMPRESSION: Increased echogenicity of hepatic parenchyma is noted suggesting hepatic steatosis or other diffuse hepatocellular disease. Electronically Signed   By: Lupita Raider M.D.    On: 07/13/2019 13:27    Assessment & Plan:   Essential hypertension- His blood pressure is well-controlled. -     CBC with Differential/Platelet; Future -     Basic metabolic panel; Future  Angioedema, initial encounter- Will discontinue the ACE inhibitor. -     hydrOXYzine Pamoate; Take 1 capsule (25 mg total) by mouth every 8 (eight) hours as needed.  Dispense: 30 capsule; Refill: 0 -     C-reactive protein; Future  Contact dermatitis and eczema due to plant -     hydrOXYzine Pamoate;  Take 1 capsule (25 mg total) by mouth every 8 (eight) hours as needed.  Dispense: 30 capsule; Refill: 0 -     C-reactive protein; Future -     Fluocinonide Emulsified Base; Apply 1 Application topically 2 (two) times daily.  Dispense: 60 g; Refill: 1 -     methylPREDNISolone Acetate  Tick bite of back wall of thorax, unspecified location, initial encounter- Labs are negative for tickborne illness. -     C-reactive protein; Future -     B. burgdorfi antibodies by WB; Future -     Rocky mtn spotted fvr abs pnl(IgG+IgM); Future  Nonalcoholic steatohepatitis (NASH)- I recommended that he stop taking pioglitazone.     Follow-up: Return in about 3 weeks (around 05/09/2023).  Sanda Linger, MD

## 2023-04-18 NOTE — Patient Instructions (Signed)
Contact Dermatitis Dermatitis is redness, soreness, and swelling (inflammation) of the skin. Contact dermatitis is a reaction to certain substances that touch the skin. There are two types of this condition: Irritant contact dermatitis. This is the most common type. It happens when something irritates your skin, such as when your hands get dry from washing them too often with soap. You can get this type of reaction even if you have not been exposed to the irritant before. Allergic contact dermatitis. This type is caused by a substance that you are allergic to, such as poison ivy. It occurs when you have been exposed to the substance (allergen) and form a sensitivity to it. In some cases, the reaction may start soon after your first exposure to the allergen. In other cases, it may not start until you are exposed to the allergen again. It may then occur every time you are exposed to the allergen in the future. What are the causes? Irritant contact dermatitis is often caused by exposure to: Makeup. Soaps, detergents, and bleaches. Acids. Metal salts, such as nickel. Allergic contact dermatitis is often caused by exposure to: Poisonous plants. Chemicals. Jewelry. Latex. Medicines. Preservatives in products, such as clothes. What increases the risk? You are more likely to get this condition if you have: A job that exposes you to irritants or allergens. Certain medical conditions. These include asthma and eczema. What are the signs or symptoms? Symptoms of this condition may occur in any place on your body that has been touched by the irritant. Symptoms include: Dryness, flaking, or cracking. Redness. Itching. Pain or a burning feeling. Blisters. Drainage of small amounts of blood or clear fluid from skin cracks. With allergic contact dermatitis, there may also be swelling in areas such as the eyelids, mouth, or genitals. How is this diagnosed? This condition is diagnosed with a medical  history and physical exam. A patch skin test may be done to help figure out the cause. If the condition is related to your job, you may need to see an expert in health problems in the workplace (occupational medicine specialist). How is this treated? This condition is treated by staying away from the cause of the reaction and protecting your skin from further contact. Treatment may also include: Steroid creams or ointments. Steroid medicines may need be taken by mouth (orally) in more severe cases. Antibiotics or medicines applied to the skin to kill bacteria (antibacterial ointments). These may be needed if a skin infection is present. Antihistamines. These may be taken orally or put on as a lotion to ease itching. A bandage (dressing). Follow these instructions at home: Skin care Moisturize your skin as needed. Put cool, wet cloths (cool compresses) on the affected areas. Try applying baking soda paste to your skin. Stir water into baking soda until it has the consistency of a paste. Do not scratch your skin. Avoid friction to the affected area. Avoid the use of soaps, perfumes, and dyes. Check the affected areas every day for signs of infection. Check for: More redness, swelling, or pain. More fluid or blood. Warmth. Pus or a bad smell. Medicines Take or apply over-the-counter and prescription medicines only as told by your health care provider. If you were prescribed antibiotics, take or apply them as told by your health care provider. Do not stop using the antibiotic even if you start to feel better. Bathing Try taking a bath with: Epsom salts. Follow the instructions on the packaging. You can get these at your local pharmacy   or grocery store. Baking soda. Pour a small amount into the bath as told by your health care provider. Colloidal oatmeal. Follow the instructions on the packaging. You can get this at your local pharmacy or grocery store. Bathe less often. This may mean bathing  every other day. Bathe in lukewarm water. Avoid using hot water. Bandage care If you were given a dressing, change it as told by your health care provider. Wash your hands with soap and water for at least 20 seconds before and after you change your dressing. If soap and water are not available, use hand sanitizer. General instructions Avoid the substance that caused your reaction. If you do not know what caused it, keep a journal to try to track what caused it. Write down: What you eat and drink. What cosmetics you use. What you wear in the affected area. This includes jewelry. Contact a health care provider if: Your condition does not get better with treatment. Your condition gets worse. You have any signs of infection. You have a fever. You have new symptoms. Your bone or joint under the affected area becomes painful after the skin has healed. Get help right away if: You notice red streaks coming from the affected area. The affected area turns darker. You have trouble breathing. This information is not intended to replace advice given to you by your health care provider. Make sure you discuss any questions you have with your health care provider. Document Revised: 05/21/2022 Document Reviewed: 05/21/2022 Elsevier Patient Education  2023 [employer]  

## 2023-04-21 ENCOUNTER — Other Ambulatory Visit: Payer: Self-pay | Admitting: Internal Medicine

## 2023-04-21 DIAGNOSIS — K7581 Nonalcoholic steatohepatitis (NASH): Secondary | ICD-10-CM

## 2023-04-21 LAB — B. BURGDORFI ANTIBODIES BY WB

## 2023-04-21 LAB — ROCKY MTN SPOTTED FVR ABS PNL(IGG+IGM)
RMSF IgG: NOT DETECTED
RMSF IgM: NOT DETECTED

## 2023-04-25 ENCOUNTER — Other Ambulatory Visit: Payer: Self-pay | Admitting: Internal Medicine

## 2023-04-25 DIAGNOSIS — K7581 Nonalcoholic steatohepatitis (NASH): Secondary | ICD-10-CM

## 2023-05-09 ENCOUNTER — Encounter: Payer: Self-pay | Admitting: Internal Medicine

## 2023-05-09 ENCOUNTER — Ambulatory Visit (INDEPENDENT_AMBULATORY_CARE_PROVIDER_SITE_OTHER): Payer: Medicare Other | Admitting: Internal Medicine

## 2023-05-09 VITALS — BP 126/76 | HR 81 | Temp 98.0°F | Ht 67.0 in | Wt 199.0 lb

## 2023-05-09 DIAGNOSIS — T783XXA Angioneurotic edema, initial encounter: Secondary | ICD-10-CM

## 2023-05-09 DIAGNOSIS — R233 Spontaneous ecchymoses: Secondary | ICD-10-CM | POA: Diagnosis not present

## 2023-05-09 DIAGNOSIS — L2084 Intrinsic (allergic) eczema: Secondary | ICD-10-CM

## 2023-05-09 DIAGNOSIS — E785 Hyperlipidemia, unspecified: Secondary | ICD-10-CM

## 2023-05-09 DIAGNOSIS — I10 Essential (primary) hypertension: Secondary | ICD-10-CM

## 2023-05-09 MED ORDER — LEVOCETIRIZINE DIHYDROCHLORIDE 5 MG PO TABS
5.0000 mg | ORAL_TABLET | Freq: Every evening | ORAL | 1 refills | Status: DC
Start: 2023-05-09 — End: 2023-11-04

## 2023-05-09 NOTE — Patient Instructions (Signed)
Contusion A contusion is a deep bruise. Contusions are the result of a blunt injury to tissues and muscle fibers under the skin. The injury causes bleeding under the skin. The skin over the contusion may turn blue, purple, or yellow. Minor injuries will give you a painless contusion, but more severe injuries cause contusions that can stay painful and swollen for a few weeks. Follow these instructions at home: Pay attention to any changes in your symptoms. Let your health care provider know about them. Take these actions to relieve your pain. Managing pain, stiffness, and swelling  Use resting, icing, applying pressure (compression), and raising (elevating) the injured area. This is often called the RICE method. Rest the injured area. Return to your normal activities as told by your health care provider. Ask your health care provider what activities are safe for you. If directed, put ice on the injured area. To do this: Put ice in a plastic bag. Place a towel between your skin and the bag. Leave the ice on for 20 minutes, 2-3 times a day. If your skin turns bright red, remove the ice right away to prevent skin damage. The risk of skin damage is higher if you cannot feel pain, heat, or cold. If directed, apply light compression to the injured area using an elastic bandage. Make sure the bandage is not wrapped too tightly. Remove and reapply the bandage as directed by your health care provider. If possible, elevate the injured area above the level of your heart while you are sitting or lying down. General instructions Take over-the-counter and prescription medicines only as told by your health care provider. Keep all follow-up visits. Your health care provider may want to see how your contusion is healing with treatment. Contact a health care provider if: Your symptoms do not improve after several days of treatment. Your symptoms get worse. You have difficulty moving the injured area. Get help  right away if: You have severe pain. You have numbness in a hand or foot. Your hand or foot turns pale or cold. This information is not intended to replace advice given to you by your health care provider. Make sure you discuss any questions you have with your health care provider. Document Revised: 05/03/2022 Document Reviewed: 05/03/2022 Elsevier Patient Education  2024 [employer].

## 2023-05-09 NOTE — Progress Notes (Signed)
Subjective:  Patient ID: Kenneth Moor Sr., male    DOB: November 04, 1956  Age: 67 y.o. MRN: [id number]  CC: No chief complaint on file.   HPI [name] Sr. presents for f/up ---  He complains of a 4 day history or painless bruising over both upper arms after a minor injury. He is taking advil and drinks up to 6 beers/day. His lower lip still feels swollen.  Outpatient Medications Prior to Visit  Medication Sig Dispense Refill   amLODipine (NORVASC) 5 MG tablet Take 1 tablet (5 mg total) by mouth daily. 90 tablet 1   Carboxymethylcellulose Sodium (EYE DROPS OP) Apply to eye. Eye Promise Brand     celecoxib (CELEBREX) 200 MG capsule TAKE 1 CAPSULE BY MOUTH EVERY DAY (NEED PRIMARY INS) 90 capsule 0   fluocinonide-emollient (LIDEX-E) 0.05 % cream Apply 1 Application topically 2 (two) times daily. 60 g 1   gabapentin (NEURONTIN) 100 MG capsule TAKE 1 CAPSULE BY MOUTH THREE TIMES A DAY 270 capsule 1   hydrOXYzine (VISTARIL) 25 MG capsule Take 1 capsule (25 mg total) by mouth every 8 (eight) hours as needed. 30 capsule 0   Multiple Vitamins-Minerals (CENTRUM SILVER PO) Take by mouth.     omeprazole (PRILOSEC) 20 MG capsule Take 20 mg by mouth daily.     pravastatin (PRAVACHOL) 80 MG tablet TAKE 1 TABLET BY MOUTH EVERY DAY 90 tablet 0   No facility-administered medications prior to visit.    ROS Review of Systems  Constitutional: Negative.  Negative for diaphoresis and fatigue.  HENT: Negative.    Eyes: Negative.   Respiratory: Negative.  Negative for cough, chest tightness, shortness of breath and wheezing.   Cardiovascular:  Negative for chest pain, palpitations and leg swelling.  Gastrointestinal:  Negative for abdominal pain, constipation, diarrhea, nausea and vomiting.  Endocrine: Negative.   Genitourinary: Negative.  Negative for difficulty urinating.  Musculoskeletal: Negative.  Negative for arthralgias, back pain, joint swelling and myalgias.  Skin: Negative.  Negative for  rash.    Objective:  BP 126/76 (BP Location: Left Arm, Patient Position: Sitting, Cuff Size: Large)   Pulse 81   Temp 98 F (36.7 C) (Oral)   Ht 5\' 7"  (1.702 m)   Wt 199 lb (90.3 kg)   SpO2 96%   BMI 31.17 kg/m   BP Readings from Last 3 Encounters:  05/09/23 126/76  04/18/23 120/66  10/18/22 132/84    Wt Readings from Last 3 Encounters:  05/09/23 199 lb (90.3 kg)  04/18/23 201 lb (91.2 kg)  10/18/22 196 lb (88.9 kg)    Physical Exam  Lab Results  Component Value Date   WBC 6.0 04/18/2023   HGB 14.2 04/18/2023   HCT 40.8 04/18/2023   PLT 286.0 04/18/2023   GLUCOSE 87 04/18/2023   CHOL 216 (H) 07/12/2022   TRIG 83.0 07/12/2022   HDL 112.20 07/12/2022   LDLDIRECT 105.0 07/02/2019   LDLCALC 87 07/12/2022   ALT 20 07/12/2022   AST 17 07/12/2022   NA 135 04/18/2023   K 4.2 04/18/2023   CL 99 04/18/2023   CREATININE 0.82 04/18/2023   BUN 7 04/18/2023   CO2 27 04/18/2023   TSH 3.08 10/18/2022   PSA 0.26 10/18/2022   INR 0.9 07/14/2020   HGBA1C 5.0 10/09/2018    US Abdomen Limited RUQ  Result Date: 07/13/2019 CLINICAL DATA:  Elevated liver function tests. EXAM: ULTRASOUND ABDOMEN LIMITED RIGHT UPPER QUADRANT COMPARISON:  None. FINDINGS: Gallbladder:  No gallstones or wall thickening visualized. No sonographic Murphy sign noted by sonographer. Common bile duct: Diameter: 4 mm which is within normal limits. Liver: No focal lesion identified. Increased echogenicity of hepatic parenchyma is noted suggesting hepatic steatosis or other diffuse hepatocellular disease. Portal vein is patent on color Doppler imaging with normal direction of blood flow towards the liver. Other: None. IMPRESSION: Increased echogenicity of hepatic parenchyma is noted suggesting hepatic steatosis or other diffuse hepatocellular disease. Electronically Signed   By: Lupita Raider M.D.   On: 07/13/2019 13:27    Assessment & Plan:   Easy bruising -     Protime-INR; Future -     APTT; Future -      CBC with Differential/Platelet; Future -     Hepatic function panel; Future  Essential hypertension -     CBC with Differential/Platelet; Future -     Hepatic function panel; Future  Dyslipidemia, goal LDL below 130 -     Hepatic function panel; Future     Follow-up: No follow-ups on file.  Kenneth Linger, MD

## 2023-05-10 LAB — HEPATIC FUNCTION PANEL
ALT: 20 U/L (ref 0–53)
AST: 31 U/L (ref 0–37)
Albumin: 4.3 g/dL (ref 3.5–5.2)
Alkaline Phosphatase: 87 U/L (ref 39–117)
Bilirubin, Direct: 0.1 mg/dL (ref 0.0–0.3)
Total Bilirubin: 0.7 mg/dL (ref 0.2–1.2)
Total Protein: 7.5 g/dL (ref 6.0–8.3)

## 2023-05-10 LAB — CBC WITH DIFFERENTIAL/PLATELET
Basophils Absolute: 0 10*3/uL (ref 0.0–0.1)
Basophils Relative: 0.7 % (ref 0.0–3.0)
Eosinophils Absolute: 0.1 10*3/uL (ref 0.0–0.7)
Eosinophils Relative: 1.5 % (ref 0.0–5.0)
HCT: 43.7 % (ref 39.0–52.0)
Hemoglobin: 14.5 g/dL (ref 13.0–17.0)
Lymphocytes Relative: 14.5 % (ref 12.0–46.0)
Lymphs Abs: 0.9 10*3/uL (ref 0.7–4.0)
MCHC: 33.1 g/dL (ref 30.0–36.0)
MCV: 99.6 fl (ref 78.0–100.0)
Monocytes Absolute: 0.6 10*3/uL (ref 0.1–1.0)
Monocytes Relative: 8.8 % (ref 3.0–12.0)
Neutro Abs: 4.8 10*3/uL (ref 1.4–7.7)
Neutrophils Relative %: 74.5 % (ref 43.0–77.0)
Platelets: 272 10*3/uL (ref 150.0–400.0)
RBC: 4.39 Mil/uL (ref 4.22–5.81)
RDW: 13.2 % (ref 11.5–15.5)
WBC: 6.5 10*3/uL (ref 4.0–10.5)

## 2023-05-10 LAB — PROTIME-INR
INR: 1 ratio (ref 0.8–1.0)
Prothrombin Time: 10.9 s (ref 9.6–13.1)

## 2023-05-10 LAB — APTT: aPTT: 23.1 s — ABNORMAL LOW (ref 25.4–36.8)

## 2023-05-29 ENCOUNTER — Other Ambulatory Visit: Payer: Self-pay | Admitting: Internal Medicine

## 2023-05-29 DIAGNOSIS — I10 Essential (primary) hypertension: Secondary | ICD-10-CM

## 2023-05-31 ENCOUNTER — Other Ambulatory Visit: Payer: Self-pay | Admitting: Internal Medicine

## 2023-05-31 DIAGNOSIS — I10 Essential (primary) hypertension: Secondary | ICD-10-CM

## 2023-06-21 ENCOUNTER — Other Ambulatory Visit: Payer: Self-pay | Admitting: Internal Medicine

## 2023-06-21 DIAGNOSIS — M159 Polyosteoarthritis, unspecified: Secondary | ICD-10-CM

## 2023-06-21 DIAGNOSIS — E785 Hyperlipidemia, unspecified: Secondary | ICD-10-CM

## 2023-06-21 DIAGNOSIS — G8929 Other chronic pain: Secondary | ICD-10-CM

## 2023-07-14 ENCOUNTER — Encounter (INDEPENDENT_AMBULATORY_CARE_PROVIDER_SITE_OTHER): Payer: Self-pay

## 2023-08-06 ENCOUNTER — Emergency Department (HOSPITAL_COMMUNITY): Payer: Medicare Other

## 2023-08-06 ENCOUNTER — Other Ambulatory Visit: Payer: Self-pay

## 2023-08-06 ENCOUNTER — Encounter (HOSPITAL_COMMUNITY): Payer: Self-pay | Admitting: Internal Medicine

## 2023-08-06 ENCOUNTER — Inpatient Hospital Stay (HOSPITAL_COMMUNITY)
Admission: EM | Admit: 2023-08-06 | Discharge: 2023-08-09 | DRG: 418 | Disposition: A | Discharge status: Home / Self Care | Payer: Medicare Other | Attending: Internal Medicine | Admitting: Internal Medicine

## 2023-08-06 DIAGNOSIS — K7460 Unspecified cirrhosis of liver: Secondary | ICD-10-CM | POA: Diagnosis present

## 2023-08-06 DIAGNOSIS — K2980 Duodenitis without bleeding: Secondary | ICD-10-CM | POA: Diagnosis present

## 2023-08-06 DIAGNOSIS — R233 Spontaneous ecchymoses: Secondary | ICD-10-CM | POA: Diagnosis present

## 2023-08-06 DIAGNOSIS — Z96642 Presence of left artificial hip joint: Secondary | ICD-10-CM | POA: Diagnosis present

## 2023-08-06 DIAGNOSIS — E669 Obesity, unspecified: Secondary | ICD-10-CM | POA: Diagnosis present

## 2023-08-06 DIAGNOSIS — J4489 Other specified chronic obstructive pulmonary disease: Secondary | ICD-10-CM | POA: Diagnosis present

## 2023-08-06 DIAGNOSIS — Z888 Allergy status to other drugs, medicaments and biological substances status: Secondary | ICD-10-CM | POA: Diagnosis not present

## 2023-08-06 DIAGNOSIS — K760 Fatty (change of) liver, not elsewhere classified: Secondary | ICD-10-CM | POA: Diagnosis present

## 2023-08-06 DIAGNOSIS — I10 Essential (primary) hypertension: Secondary | ICD-10-CM | POA: Diagnosis present

## 2023-08-06 DIAGNOSIS — Z8249 Family history of ischemic heart disease and other diseases of the circulatory system: Secondary | ICD-10-CM | POA: Diagnosis not present

## 2023-08-06 DIAGNOSIS — Z833 Family history of diabetes mellitus: Secondary | ICD-10-CM | POA: Diagnosis not present

## 2023-08-06 DIAGNOSIS — Z87891 Personal history of nicotine dependence: Secondary | ICD-10-CM

## 2023-08-06 DIAGNOSIS — E871 Hypo-osmolality and hyponatremia: Secondary | ICD-10-CM | POA: Diagnosis not present

## 2023-08-06 DIAGNOSIS — E785 Hyperlipidemia, unspecified: Secondary | ICD-10-CM | POA: Diagnosis present

## 2023-08-06 DIAGNOSIS — J439 Emphysema, unspecified: Secondary | ICD-10-CM | POA: Diagnosis present

## 2023-08-06 DIAGNOSIS — K810 Acute cholecystitis: Principal | ICD-10-CM | POA: Diagnosis present

## 2023-08-06 DIAGNOSIS — E876 Hypokalemia: Secondary | ICD-10-CM | POA: Diagnosis not present

## 2023-08-06 DIAGNOSIS — K219 Gastro-esophageal reflux disease without esophagitis: Secondary | ICD-10-CM | POA: Diagnosis present

## 2023-08-06 DIAGNOSIS — K82A1 Gangrene of gallbladder in cholecystitis: Secondary | ICD-10-CM | POA: Diagnosis not present

## 2023-08-06 DIAGNOSIS — Z6834 Body mass index (BMI) 34.0-34.9, adult: Secondary | ICD-10-CM | POA: Diagnosis not present

## 2023-08-06 DIAGNOSIS — G4730 Sleep apnea, unspecified: Secondary | ICD-10-CM | POA: Diagnosis not present

## 2023-08-06 DIAGNOSIS — Z79899 Other long term (current) drug therapy: Secondary | ICD-10-CM | POA: Diagnosis not present

## 2023-08-06 DIAGNOSIS — R1013 Epigastric pain: Principal | ICD-10-CM

## 2023-08-06 LAB — CBC
HCT: 44.8 % (ref 39.0–52.0)
HCT: 43.1 % (ref 39.0–52.0)
Hemoglobin: 15.5 g/dL (ref 13.0–17.0)
Hemoglobin: 14.7 g/dL (ref 13.0–17.0)
MCH: 32.6 pg (ref 26.0–34.0)
MCH: 33 pg (ref 26.0–34.0)
MCHC: 34.6 g/dL (ref 30.0–36.0)
MCHC: 34.1 g/dL (ref 30.0–36.0)
MCV: 94.3 fL (ref 80.0–100.0)
MCV: 96.6 fL (ref 80.0–100.0)
Platelets: 291 10*3/uL (ref 150–400)
Platelets: 220 10*3/uL (ref 150–400)
RBC: 4.75 MIL/uL (ref 4.22–5.81)
RBC: 4.46 MIL/uL (ref 4.22–5.81)
RDW: 11.8 % (ref 11.5–15.5)
RDW: 12.1 % (ref 11.5–15.5)
WBC: 20.2 10*3/uL — ABNORMAL HIGH (ref 4.0–10.5)
WBC: 20.3 10*3/uL — ABNORMAL HIGH (ref 4.0–10.5)
nRBC: 0 % (ref 0.0–0.2)
nRBC: 0 % (ref 0.0–0.2)

## 2023-08-06 LAB — SURGICAL PCR SCREEN
MRSA, PCR: NEGATIVE
Staphylococcus aureus: NEGATIVE

## 2023-08-06 LAB — BASIC METABOLIC PANEL
Anion gap: 16 — ABNORMAL HIGH (ref 5–15)
BUN: 6 mg/dL — ABNORMAL LOW (ref 8–23)
CO2: 21 mmol/L — ABNORMAL LOW (ref 22–32)
Calcium: 8.8 mg/dL — ABNORMAL LOW (ref 8.9–10.3)
Chloride: 94 mmol/L — ABNORMAL LOW (ref 98–111)
Creatinine, Ser: 0.9 mg/dL (ref 0.61–1.24)
GFR, Estimated: >60 mL/min (ref >60)
Glucose, Bld: 134 mg/dL — ABNORMAL HIGH (ref 70–99)
Potassium: 3.6 mmol/L (ref 3.5–5.1)
Sodium: 131 mmol/L — ABNORMAL LOW (ref 135–145)

## 2023-08-06 LAB — TROPONIN I (HIGH SENSITIVITY)
Troponin I (High Sensitivity): 6 ng/L (ref <18)
Troponin I (High Sensitivity): 7 ng/L (ref <18)

## 2023-08-06 LAB — HEMOGLOBIN A1C
Hgb A1c MFr Bld: 5.2 % (ref 4.8–5.6)
Mean Plasma Glucose: 102.54 mg/dL

## 2023-08-06 LAB — URINALYSIS, ROUTINE W REFLEX MICROSCOPIC
Bilirubin Urine: NEGATIVE
Glucose, UA: NEGATIVE mg/dL
Hgb urine dipstick: NEGATIVE
Ketones, ur: 5 mg/dL — AB
Leukocytes,Ua: NEGATIVE
Nitrite: NEGATIVE
Protein, ur: NEGATIVE mg/dL
Specific Gravity, Urine: 1.011 (ref 1.005–1.030)
pH: 6 (ref 5.0–8.0)

## 2023-08-06 LAB — HEPATIC FUNCTION PANEL
ALT: 34 U/L (ref 0–44)
AST: 38 U/L (ref 15–41)
Albumin: 3.6 g/dL (ref 3.5–5.0)
Alkaline Phosphatase: 102 U/L (ref 38–126)
Bilirubin, Direct: 0.2 mg/dL (ref 0.0–0.2)
Indirect Bilirubin: 0.6 mg/dL (ref 0.3–0.9)
Total Bilirubin: 0.8 mg/dL (ref 0.3–1.2)
Total Protein: 7.3 g/dL (ref 6.5–8.1)

## 2023-08-06 LAB — LIPASE, BLOOD: Lipase: 25 U/L (ref 11–51)

## 2023-08-06 LAB — CREATININE, SERUM
Creatinine, Ser: 1.04 mg/dL (ref 0.61–1.24)
GFR, Estimated: >60 mL/min (ref >60)

## 2023-08-06 MED ORDER — HEPARIN SODIUM (PORCINE) 5000 UNIT/ML IJ SOLN
5000.0000 [IU] | Freq: Three times a day (TID) | INTRAMUSCULAR | Status: DC
  Administered 2023-08-06 – 2023-08-09 (×8): 5000 [IU] via SUBCUTANEOUS
  Filled 2023-08-06 (×8): qty 1

## 2023-08-06 MED ORDER — HYDROMORPHONE HCL 1 MG/ML IJ SOLN
0.5000 mg | INTRAMUSCULAR | Status: DC | PRN
  Administered 2023-08-06: 0.5 mg via INTRAVENOUS
  Filled 2023-08-06: qty 0.5

## 2023-08-06 MED ORDER — ACETAMINOPHEN 325 MG PO TABS
650.0000 mg | ORAL_TABLET | Freq: Four times a day (QID) | ORAL | Status: DC | PRN
  Administered 2023-08-07: 650 mg via ORAL
  Filled 2023-08-06: qty 2

## 2023-08-06 MED ORDER — IOHEXOL 350 MG/ML SOLN
100.0000 mL | Freq: Once | INTRAVENOUS | Status: AC | PRN
  Administered 2023-08-06: 100 mL via INTRAVENOUS

## 2023-08-06 MED ORDER — MORPHINE SULFATE (PF) 4 MG/ML IV SOLN
4.0000 mg | Freq: Once | INTRAVENOUS | Status: AC
  Administered 2023-08-06: 4 mg via INTRAVENOUS
  Filled 2023-08-06: qty 1

## 2023-08-06 MED ORDER — GABAPENTIN 100 MG PO CAPS
100.0000 mg | ORAL_CAPSULE | Freq: Three times a day (TID) | ORAL | Status: DC
  Administered 2023-08-06 – 2023-08-09 (×8): 100 mg via ORAL
  Filled 2023-08-06 (×8): qty 1

## 2023-08-06 MED ORDER — KETOROLAC TROMETHAMINE 30 MG/ML IJ SOLN
30.0000 mg | Freq: Four times a day (QID) | INTRAMUSCULAR | Status: DC | PRN
  Administered 2023-08-06 – 2023-08-07 (×3): 30 mg via INTRAVENOUS
  Filled 2023-08-06 (×3): qty 1

## 2023-08-06 MED ORDER — LORATADINE 10 MG PO TABS
10.0000 mg | ORAL_TABLET | Freq: Every evening | ORAL | Status: DC
  Administered 2023-08-06 – 2023-08-08 (×3): 10 mg via ORAL
  Filled 2023-08-06 (×3): qty 1

## 2023-08-06 MED ORDER — ACETAMINOPHEN 650 MG RE SUPP: 650.0000 mg | Freq: Four times a day (QID) | RECTAL | Status: DC | PRN

## 2023-08-06 MED ORDER — MUPIROCIN 2 % EX OINT
1.0000 | TOPICAL_OINTMENT | Freq: Two times a day (BID) | CUTANEOUS | Status: DC
  Administered 2023-08-06 – 2023-08-08 (×5): 1 via NASAL
  Filled 2023-08-06: qty 22

## 2023-08-06 MED ORDER — HYDROCODONE-ACETAMINOPHEN 5-325 MG PO TABS
1.0000 | ORAL_TABLET | Freq: Once | ORAL | Status: AC
  Administered 2023-08-06: 1 via ORAL
  Filled 2023-08-06: qty 1

## 2023-08-06 MED ORDER — PRAVASTATIN SODIUM 40 MG PO TABS
80.0000 mg | ORAL_TABLET | Freq: Every day | ORAL | Status: DC
  Administered 2023-08-06 – 2023-08-09 (×3): 80 mg via ORAL
  Filled 2023-08-06 (×3): qty 2

## 2023-08-06 MED ORDER — SENNA 8.6 MG PO TABS
1.0000 | ORAL_TABLET | Freq: Two times a day (BID) | ORAL | Status: DC
  Administered 2023-08-06 – 2023-08-09 (×5): 8.6 mg via ORAL
  Filled 2023-08-06 (×6): qty 1

## 2023-08-06 MED ORDER — DEXTROSE-SODIUM CHLORIDE 5-0.45 % IV SOLN: INTRAVENOUS | Status: DC

## 2023-08-06 MED ORDER — PIPERACILLIN-TAZOBACTAM 3.375 G IVPB 30 MIN
3.3750 g | Freq: Once | INTRAVENOUS | Status: AC
  Administered 2023-08-06: 3.375 g via INTRAVENOUS
  Filled 2023-08-06: qty 50

## 2023-08-06 MED ORDER — PANTOPRAZOLE SODIUM 40 MG IV SOLR
40.0000 mg | Freq: Two times a day (BID) | INTRAVENOUS | Status: AC
  Administered 2023-08-06 – 2023-08-07 (×3): 40 mg via INTRAVENOUS
  Filled 2023-08-06 (×3): qty 10

## 2023-08-06 MED ORDER — PANTOPRAZOLE SODIUM 40 MG IV SOLR
40.0000 mg | Freq: Once | INTRAVENOUS | Status: AC
  Administered 2023-08-06: 40 mg via INTRAVENOUS
  Filled 2023-08-06: qty 10

## 2023-08-06 MED ORDER — TRAZODONE HCL 50 MG PO TABS
25.0000 mg | ORAL_TABLET | Freq: Every evening | ORAL | Status: DC | PRN
  Administered 2023-08-07: 25 mg via ORAL
  Filled 2023-08-06: qty 1

## 2023-08-06 MED ORDER — PIPERACILLIN-TAZOBACTAM 3.375 G IVPB
3.3750 g | Freq: Three times a day (TID) | INTRAVENOUS | Status: DC
  Administered 2023-08-06 – 2023-08-09 (×9): 3.375 g via INTRAVENOUS
  Filled 2023-08-06 (×9): qty 50

## 2023-08-06 MED ORDER — ONDANSETRON HCL 4 MG/2ML IJ SOLN
4.0000 mg | Freq: Once | INTRAMUSCULAR | Status: AC
  Administered 2023-08-06: 4 mg via INTRAVENOUS
  Filled 2023-08-06: qty 2

## 2023-08-06 MED ORDER — AMLODIPINE BESYLATE 5 MG PO TABS
5.0000 mg | ORAL_TABLET | Freq: Every day | ORAL | Status: DC
  Administered 2023-08-06 – 2023-08-09 (×3): 5 mg via ORAL
  Filled 2023-08-06 (×3): qty 1

## 2023-08-06 MED ORDER — SODIUM CHLORIDE 0.9 % IV BOLUS
1000.0000 mL | Freq: Once | INTRAVENOUS | Status: AC
  Administered 2023-08-06: 1000 mL via INTRAVENOUS

## 2023-08-06 NOTE — Assessment & Plan Note (Signed)
Patient with emphysema by CTA chest. He denies any symptoms. He quit smoking 15 years ago. He takes no medications.

## 2023-08-06 NOTE — ED Provider Notes (Signed)
[facility] AT [facility] Provider Note   CSN: [id number] Arrival date & time: 08/06/23  0055     History  Chief Complaint  Patient presents with   Chest Pain   Abdominal Pain    Kenneth Steeves [facility] is a 67 y.o. male.  HPI     This is a 67 year old male who presents with chest and abdominal pain.  Patient reports ongoing and worsening lower chest and epigastric pain that radiates downward into the abdomen.  He states it has progressively gotten worse during the day.  He is usually a daily drinker but has not had anything to drink today.  Nothing seems to make the pain better or worse.  Denies nausea, vomiting, change in bowel habits.  No fevers. Home Medications Prior to Admission medications   Medication Sig Start Date End Date Taking? Authorizing Provider  amLODipine (NORVASC) 5 MG tablet TAKE 1 TABLET (5 MG TOTAL) BY MOUTH DAILY. 05/29/23   [name], MD  Carboxymethylcellulose Sodium (EYE DROPS OP) Apply to eye. [facility]    [provider]  celecoxib (CELEBREX) 200 MG capsule TAKE 1 CAPSULE BY MOUTH EVERY DAY (NEED PRIMARY INS) 06/21/23   [name], MD  fluocinonide-emollient (LIDEX-E) 0.05 % cream Apply 1 Application topically 2 (two) times daily. 04/18/23   [name], MD  gabapentin (NEURONTIN) 100 MG capsule TAKE 1 CAPSULE BY MOUTH THREE TIMES A DAY 03/27/23   [name], MD  levocetirizine (XYZAL) 5 MG tablet Take 1 tablet (5 mg total) by mouth every evening. 05/09/23   [name], MD  Multiple Vitamins-Minerals (CENTRUM SILVER PO) Take by mouth.    [provider]  omeprazole (PRILOSEC) 20 MG capsule Take 20 mg by mouth daily.    [provider]  pravastatin (PRAVACHOL) 80 MG tablet TAKE 1 TABLET BY MOUTH EVERY DAY 06/21/23   [name], MD      Allergies    Lisinopril, Lyrica [pregabalin], Olmesartan, and Atorvastatin    Review of Systems   Review of Systems  Constitutional:   Negative for fever.  Respiratory:  Negative for shortness of breath.   Cardiovascular:  Negative for chest pain.  Gastrointestinal:  Positive for abdominal pain. Negative for diarrhea, nausea and vomiting.  Genitourinary:  Negative for dysuria.  All other systems reviewed and are negative.   Physical Exam Updated Vital Signs BP 118/74   Pulse (!) 105   Temp 98.3 F (36.8 C) (Oral)   Resp 18   Ht 1.702 m (5\' 7" )   Wt 99.8 kg   SpO2 100%   BMI 34.46 kg/m  Physical Exam Vitals and nursing note reviewed.  Constitutional:      Appearance: He is well-developed. He is not ill-appearing.  HENT:     Head: Normocephalic and atraumatic.  Eyes:     Pupils: Pupils are equal, round, and reactive to light.  Cardiovascular:     Rate and Rhythm: Normal rate and regular rhythm.     Heart sounds: Normal heart sounds. No murmur heard. Pulmonary:     Effort: Pulmonary effort is normal. No respiratory distress.     Breath sounds: Normal breath sounds. No wheezing.  Abdominal:     General: Bowel sounds are normal.     Palpations: Abdomen is soft.     Tenderness: There is abdominal tenderness. There is no rebound.     Comments: Generalized tenderness to palpation, no rebound or guarding  Musculoskeletal:     Cervical back: Neck supple.  Lymphadenopathy:     Cervical: No cervical adenopathy.  Skin:    General: Skin is warm and dry.  Neurological:     Mental Status: He is alert and oriented to person, place, and time.  Psychiatric:        Mood and Affect: Mood normal.     ED Results / Procedures / Treatments   Labs (all labs ordered are listed, but only abnormal results are displayed) Labs Reviewed  BASIC METABOLIC PANEL - Abnormal; Notable for the following components:      Result Value   Sodium 131 (*)    Chloride 94 (*)    CO2 21 (*)    Glucose, Bld 134 (*)    BUN 6 (*)    Calcium 8.8 (*)    Anion gap 16 (*)    All other components within normal limits  CBC - Abnormal;  Notable for the following components:   WBC 20.2 (*)    All other components within normal limits  URINALYSIS, ROUTINE W REFLEX MICROSCOPIC - Abnormal; Notable for the following components:   Ketones, ur 5 (*)    Bacteria, UA RARE (*)    All other components within normal limits  LIPASE, BLOOD  HEPATIC FUNCTION PANEL  TROPONIN I (HIGH SENSITIVITY)  TROPONIN I (HIGH SENSITIVITY)    EKG None  Radiology CT Angio Chest/Abd/Pel for Dissection W and/or Wo Contrast  Result Date: 08/06/2023 CLINICAL DATA:  67 year old male with history of substernal chest pain radiating into the mid back and down to the umbilical region for 1 day. Clinical suspicion for acute aortic syndrome. EXAM: CT ANGIOGRAPHY CHEST, ABDOMEN AND PELVIS TECHNIQUE: Non-contrast CT of the chest was initially obtained. Multidetector CT imaging through the chest, abdomen and pelvis was performed using the standard protocol during bolus administration of intravenous contrast. Multiplanar reconstructed images and MIPs were obtained and reviewed to evaluate the vascular anatomy. RADIATION DOSE REDUCTION: This exam was performed according to the departmental dose-optimization program which includes automated exposure control, adjustment of the mA and/or kV according to patient size and/or use of iterative reconstruction technique. CONTRAST:  OMNIPAQUE IOHEXOL 350 MG/ML SOLN COMPARISON:  No priors. FINDINGS: CTA CHEST FINDINGS Cardiovascular: Atherosclerotic calcifications are noted throughout the thoracic aorta. No definite coronary artery calcifications are noted. Precontrast images demonstrate no crescentic high attenuation associated with the wall of the thoracic aorta to indicate the presence of acute intramural hemorrhage. Postcontrast images demonstrate no aneurysm or dissection of the thoracic aorta. Ascending thoracic aorta, mid arch and descending thoracic aorta are normal in caliber measuring 3.0 cm, 2.5 cm and 2.5 cm in diameter  respectively. Heart size is normal. There is no significant pericardial fluid, thickening or pericardial calcification. Mediastinum/Nodes: No pathologically enlarged mediastinal or hilar lymph nodes. Esophagus is unremarkable in appearance. No axillary lymphadenopathy. Lungs/Pleura: No acute consolidative airspace disease. No pleural effusions. No definite suspicious appearing pulmonary nodules or masses are noted. Mild diffuse bronchial wall thickening with mild centrilobular and paraseptal emphysema. Musculoskeletal: There are no aggressive appearing lytic or blastic lesions noted in the visualized portions of the skeleton. Review of the MIP images confirms the above findings. CTA ABDOMEN AND PELVIS FINDINGS VASCULAR Aorta: Aortic atherosclerosis. Normal caliber aorta without aneurysm, dissection, vasculitis or significant stenosis. Celiac: Patent without evidence of aneurysm, dissection, vasculitis or significant stenosis. SMA: Patent without evidence of aneurysm, dissection, vasculitis or significant stenosis. Renals: Both renal arteries are patent without evidence of aneurysm,  dissection, vasculitis, fibromuscular dysplasia or significant stenosis. IMA: Patent without evidence of aneurysm, dissection, vasculitis or significant stenosis. Inflow: Patent without evidence of aneurysm, dissection, vasculitis or significant stenosis. Veins: No obvious venous abnormality within the limitations of this arterial phase study. Review of the MIP images confirms the above findings. NON-VASCULAR Hepatobiliary: No definite suspicious cystic or solid hepatic lesions are noted on today's arterial phase examination. There is diffuse low attenuation throughout the hepatic parenchyma, indicative of a background of hepatic steatosis. No intra or extrahepatic biliary ductal dilatation. Gallbladder is moderately distended. There is a suggestion of some stones and/or sludge lying dependently in the gallbladder. Trace amount of soft  tissue stranding surrounding the gallbladder. Pancreas: No pancreatic mass. No pancreatic ductal dilatation. No pancreatic or peripancreatic fluid collections or inflammatory changes. Spleen: Unremarkable. Adrenals/Urinary Tract: Bilateral kidneys and bilateral adrenal glands are normal in appearance. No hydroureteronephrosis. Urinary bladder is moderately distended, but otherwise unremarkable in appearance. Stomach/Bowel: The appearance of the stomach is normal. Subtle soft tissue stranding is noted adjacent to the second portion of the duodenum. No pathologic dilatation of small bowel or colon. Normal appendix. Lymphatic: No lymphadenopathy noted in the abdomen or pelvis. Reproductive: Prostate gland and seminal vesicles are unremarkable in appearance. Other: No significant volume of ascites. No pneumoperitoneum. Small umbilical hernia containing only omental fat. Musculoskeletal: There are no aggressive appearing lytic or blastic lesions noted in the visualized portions of the skeleton. Review of the MIP images confirms the above findings. IMPRESSION: 1. While there is aortic atherosclerosis, there are no findings to suggest acute aortic syndrome. 2. Gallbladder is moderately distended with a suggestion of some dependent stones and/or biliary sludge, as well as a trace amount of surrounding soft tissue stranding. These findings could indicate an acute cholecystitis. Further clinical evaluation is recommended. Consideration for follow-up right upper quadrant abdominal ultrasound is recommended if clinically appropriate. 3. Subtle inflammatory changes also noted adjacent to the second portion of the duodenal. If there is no evidence of acute cholecystitis, clinical correlation for signs and symptoms of potential duodenitis is suggested. 4. Hepatic steatosis. 5. Tiny umbilical hernia containing only omental fat. No associated bowel incarceration or obstruction at this time. 6. Mild diffuse bronchial wall thickening  with mild centrilobular and paraseptal emphysema; imaging findings suggestive of underlying COPD. Electronically Signed   By: Trudie Reed M.D.   On: 08/06/2023 05:38   DG Chest 2 View  Result Date: 08/06/2023 CLINICAL DATA:  Chest pain. EXAM: CHEST - 2 VIEW COMPARISON:  Chest x-ray 03/12/2013. FINDINGS: The heart size and mediastinal contours are within normal limits. Both lungs are clear. The visualized skeletal structures are unremarkable. IMPRESSION: No active cardiopulmonary disease. Electronically Signed   By: Darliss Cheney M.D.   On: 08/06/2023 01:29    Procedures Procedures    Medications Ordered in ED Medications  morphine (PF) 4 MG/ML injection 4 mg (4 mg Intravenous Given 08/06/23 0345)  ondansetron (ZOFRAN) injection 4 mg (4 mg Intravenous Given 08/06/23 0345)  sodium chloride 0.9 % bolus 1,000 mL (0 mLs Intravenous Stopped 08/06/23 0506)  iohexol (OMNIPAQUE) 350 MG/ML injection 100 mL (100 mLs Intravenous Contrast Given 08/06/23 0404)  morphine (PF) 4 MG/ML injection 4 mg (4 mg Intravenous Given 08/06/23 0614)  pantoprazole (PROTONIX) injection 40 mg (40 mg Intravenous Given 08/06/23 1610)    ED Course/ Medical Decision Making/ A&P Clinical Course as of 08/06/23 9604  Sat Aug 06, 2023  5409 Patient with some ongoing discomfort.  Patient given additional dose of  pain medication.  Also given IV Protonix for possible gastritis/duodenitis.  Right upper quadrant ultrasound ordered given CT imaging. [CH]    Clinical Course User Index [CH] Kimiah Hibner, Mayer Masker, MD                                 Medical Decision Making Amount and/or Complexity of Data Reviewed Labs: ordered. Radiology: ordered.  Risk Prescription drug management.   This patient presents to the ED for concern of chest and abdominal pain, this involves an extensive number of treatment options, and is a complaint that carries with it a high risk of complications and morbidity.  I considered the following differential  and admission for this acute, potentially life threatening condition.  The differential diagnosis includes ACS, PE, pneumothorax, pneumonia, dissection, pancreatitis, cholecystitis, gastritis, peptic ulcers  MDM:    This is a 67 year old male who presents with epigastric, chest and abdominal pain.  He is overall nontoxic.  He is slightly tachycardic.  He has some tenderness in the abdomen which does not specifically localize to the epigastrium.  EKG shows no evidence of acute ischemia.  Chest x-ray shows no pneumothorax or pneumonia.  I am concerned for possible dissection given the radiation of pain downward and description of pain.  CT dissection study was obtained.  In the meantime, labs were reviewed.  He does have a leukocytosis to 20.  Normal LFTs.  Normal lipase.  CT dissection study does not show any evidence of acute aortic dissection.  There are some changes around the gallbladder which could indicate cholecystitis.  He also has some inflammatory changes around the duodenum which may suggest duodenitis.  Patient was given IV Protonix and repeated morphine.  Right upper quadrant ultrasound ordered.  (Labs, imaging, consults)  Labs: I Ordered, and personally interpreted labs.  The pertinent results include: CBC, BMP, hepatic function panel, lipase, troponin x 2  Imaging Studies ordered: I ordered imaging studies including chest x-ray, CT chest abdomen and pelvis dissection study, right upper quadrant ultrasound pending I independently visualized and interpreted imaging. I agree with the radiologist interpretation  Additional history obtained from son at bedside.  External records from outside source obtained and reviewed including prior evaluations  Cardiac Monitoring: The patient was maintained on a cardiac monitor.  If on the cardiac monitor, I personally viewed and interpreted the cardiac monitored which showed an underlying rhythm of: Sinus rhythm  Reevaluation: After the  interventions noted above, I reevaluated the patient and found that they have :stayed the same  Social Determinants of Health:  lives independently, alcohol abuse history  Disposition: Pending right upper quadrant ultrasound  Co morbidities that complicate the patient evaluation  Past Medical History:  Diagnosis Date   Alcohol abuse    Arthritis    Colon polyps 05/03/2008   diminutive (hyperplastis)   COPD (chronic obstructive pulmonary disease) (HCC)    Elevated LFTs    GERD (gastroesophageal reflux disease)    Hyperlipidemia    Hypertension      Medicines Meds ordered this encounter  Medications   morphine (PF) 4 MG/ML injection 4 mg   ondansetron (ZOFRAN) injection 4 mg   sodium chloride 0.9 % bolus 1,000 mL   iohexol (OMNIPAQUE) 350 MG/ML injection 100 mL   morphine (PF) 4 MG/ML injection 4 mg   pantoprazole (PROTONIX) injection 40 mg    I have reviewed the patients home medicines and have made adjustments as needed  Problem List / ED Course: Problem List Items Addressed This Visit   None               Final Clinical Impression(s) / ED Diagnoses Final diagnoses:  None    Rx / DC Orders ED Discharge Orders     None         Elaijah Munoz, Mayer Masker, MD 08/06/23 [phone]

## 2023-08-06 NOTE — ED Notes (Signed)
Hospitalist at bedside 

## 2023-08-06 NOTE — ED Triage Notes (Signed)
Pt arrives to ED c/o sharp substernal CP that radiates to mid back and down to umbilicus region x 1 day. Pt reports gradual onset of pain throughout day.

## 2023-08-06 NOTE — Assessment & Plan Note (Signed)
BP well controlled at admission  Plan Continue home medication

## 2023-08-06 NOTE — ED Notes (Signed)
ED Provider at bedside. 

## 2023-08-06 NOTE — ED Provider Notes (Signed)
  Physical Exam  BP (!) 111/57   Pulse (!) 103   Temp 98.3 F (36.8 C) (Oral)   Resp (!) 23   Ht 5\' 7"  (1.702 m)   Wt 99.8 kg   SpO2 93%   BMI 34.46 kg/m   Physical Exam Constitutional:      General: He is not in acute distress.    Appearance: Normal appearance.  HENT:     Head: Normocephalic and atraumatic.     Nose: No congestion or rhinorrhea.  Eyes:     General:        Right eye: No discharge.        Left eye: No discharge.     Extraocular Movements: Extraocular movements intact.     Pupils: Pupils are equal, round, and reactive to light.  Cardiovascular:     Rate and Rhythm: Normal rate and regular rhythm.     Heart sounds: No murmur heard. Pulmonary:     Effort: No respiratory distress.     Breath sounds: No wheezing or rales.  Abdominal:     General: There is no distension.     Tenderness: There is abdominal tenderness in the right upper quadrant and epigastric area.  Musculoskeletal:        General: Normal range of motion.     Cervical back: Normal range of motion.  Skin:    General: Skin is warm and dry.  Neurological:     General: No focal deficit present.     Mental Status: He is alert.     Procedures  Procedures  ED Course / MDM   Clinical Course as of 08/06/23 0918  Sat Aug 06, 2023  2952 Patient with some ongoing discomfort.  Patient given additional dose of pain medication.  Also given IV Protonix for possible gastritis/duodenitis.  Right upper quadrant ultrasound ordered given CT imaging. [CH]    Clinical Course User Index [CH] Horton, Mayer Masker, MD   Medical Decision Making Amount and/or Complexity of Data Reviewed Labs: ordered. Radiology: ordered.  Risk Prescription drug management. Decision regarding hospitalization.   Patient received in handoff.  Abdominal pain with possible cholecystitis versus duodenitis.  Pending right upper quadrant ultrasound.  Right upper quadrant ultrasound is unremarkable but on my reevaluation, patient  with persistent epigastric pain.  Spoke with general surgery PA Barnetta Chapel who is not recommending surgical intervention at this time.  Spoke with gastroenterology nurse practitioner Stanton Kidney who is not recommending MRCP given normal LFTs.  She states that gastroenterology is happy to consult on the patient if a formal consult is requested.  Persistent symptoms in the setting of heavy alcohol use and duodenitis patient require hospital admission      Glendora Score, MD 08/06/23 [phone]

## 2023-08-06 NOTE — Assessment & Plan Note (Signed)
No significant problem of late. He was seen by his PCP, Dr. Sanda Linger, for this problem who recommended cessation of alcohol

## 2023-08-06 NOTE — Assessment & Plan Note (Signed)
Patient with HLD. He takes and tolerates "statin" therapy  Plan Continue home medication

## 2023-08-06 NOTE — Subjective & Objective (Signed)
Kenneth Singleton, a 67 y/o followed for COPD, HTN, HLD, OA had the onset of abdominal pain Friday, 08/04/23 that was diffuse and persistent. He denies any nausea or vomiting, change in bowel habit except not having a BM since early Friday when the stool was the usual dark color due to vitamins he takes. He has had no fever or chills, no chest pain, no respiratory symptoms. He does drink 6 oz alcohol/Singleton or more but reports he has never had any withdrawal symptomws. Due to continued pain he presents to [facility] for evaluation.

## 2023-08-06 NOTE — Assessment & Plan Note (Signed)
Patient with persistent RUQ abdominal pain that does radiated to lower abdomen and back. Lab reveals nl LFTs, lipase,general chemistries. WBC 20.2 w/o diff. U/S upper abdomen reveals no cholelithiasis and nl CBD. CTA neg for PE. CT abd/pel reveals a distended GB, tissue stranding, hepatic steatosis, question of duodenal inflammation vs GB disease. Physical exam with RUQ tenderness greatest with percussion. Working diagnosis is acute cholecystitis.  Plan Med-surg admit  NPO except chips and sips with meds  Continue Zosyn q 6  GI consult requested by EDP - to determine need for MRCP or luminal investigation  GS consult requested by EDP - does not appear in need of immediate intervention  F/u lab in AM: Cmet, CBCD

## 2023-08-06 NOTE — Assessment & Plan Note (Addendum)
>>  ASSESSMENT AND PLAN FOR ABDOMINAL PAIN WRITTEN ON 08/06/2023  1:19 PM BY Kiora Hallberg E, MD  Patient with persistent RUQ abdominal pain that does radiated to lower abdomen and back. Lab reveals nl LFTs, lipase,general chemistries. WBC 20.2 w/o diff. U/S upper abdomen reveals no cholelithiasis and nl CBD. CTA neg for PE. CT abd/pel reveals a distended GB, tissue stranding, hepatic steatosis, question of duodenal inflammation vs GB disease. Physical exam with RUQ tenderness greatest with percussion. Working diagnosis is acute cholecystitis.  Plan Med-surg admit  NPO except chips and sips with meds  Continue Zosyn q 8  GI consult requested by EDP - to determine need for MRCP or luminal investigation  GS consult requested by EDP - does not appear in need of immediate intervention  F/u lab in AM: Cmet, CBCD

## 2023-08-06 NOTE — ED Notes (Signed)
ED TO INPATIENT HANDOFF REPORT  ED Nurse Name and Phone #: Darral Dash and Trudie Reed Name/Age/Gender Kenneth Singleton Sr. 67 y.o. male Room/Bed: 024C/024C  Code Status   Code Status: Full Code  Home/SNF/Other Home Patient oriented to: self, place, time, and situation Is this baseline? Yes   Triage Complete: Triage complete  Chief Complaint Acute cholecystitis [K81.0]  Triage Note Pt arrives to ED c/o sharp substernal CP that radiates to mid back and down to umbilicus region x 1 day. Pt reports gradual onset of pain throughout day.    Allergies Allergies  Allergen Reactions   Lisinopril Swelling   Lyrica [Pregabalin] Other (See Comments)    Blurred vision   Olmesartan Other (See Comments)    dizziness   Atorvastatin     REACTION: muscle aches    Level of Care/Admitting Diagnosis ED Disposition     ED Disposition  Admit   Condition  --   Comment  Hospital Area: [facility] [[id number]]  Level of Care: Med-Surg [[id number]]  May admit patient to [facility] or [facility] if equivalent level of care is available:: Yes  Covid Evaluation: Asymptomatic - no recent exposure (last 10 days) testing not required  Diagnosis: Acute cholecystitis [575.0.ICD-9-CM]  Admitting Physician: Jacques Navy [[id number]]  Attending Physician: Jacques Navy [[id number]]  Certification:: I certify this patient will need inpatient services for at least 2 midnights  Expected Medical Readiness: 08/08/2023          B Medical/Surgery History Past Medical History:  Diagnosis Date   Alcohol abuse    Arthritis    Colon polyps 05/03/2008   diminutive (hyperplastis)   COPD (chronic obstructive pulmonary disease) (HCC)    Elevated LFTs    GERD (gastroesophageal reflux disease)    Hyperlipidemia    Hypertension    Past Surgical History:  Procedure Laterality Date   CARPAL TUNNEL RELEASE     bilateral    COLONOSCOPY  05/03/2008   [facility]   right ear surgery      due to infection     TOTAL HIP ARTHROPLASTY Left 04/03/2015   Procedure: LEFT TOTAL HIP ARTHROPLASTY ANTERIOR APPROACH;  Surgeon: Samson Frederic, MD;  Location: [facility];  Service: Orthopedics;  Laterality: Left;   VASECTOMY       A IV Location/Drains/Wounds Patient Lines/Drains/Airways Status     Active Line/Drains/Airways     Name Placement date Placement time Site Days   Peripheral IV 08/06/23 18 G 1.16" Anterior;Right Forearm 08/06/23  0342  Forearm  less than 1   Incision (Closed) 04/03/15 Hip 04/03/15  1754  -- 3047            Intake/Output Last 24 hours  Intake/Output Summary (Last 24 hours) at 08/06/2023 1239 Last data filed at 08/06/2023 0506 Gross per 24 hour  Intake 1000 ml  Output --  Net 1000 ml    Labs/Imaging Results for orders placed or performed during the hospital encounter of 08/06/23 (from the past 48 hour(s))  Basic metabolic panel     Status: Abnormal   Collection Time: 08/06/23  1:08 AM  Result Value Ref Range   Sodium 131 (L) 135 - 145 mmol/L   Potassium 3.6 3.5 - 5.1 mmol/L   Chloride 94 (L) 98 - 111 mmol/L   CO2 21 (L) 22 - 32 mmol/L   Glucose, Bld 134 (H) 70 - 99 mg/dL    Comment: Glucose reference range applies only to samples taken after fasting  for at least 8 hours.   BUN 6 (L) 8 - 23 mg/dL   Creatinine, Ser 1.30 0.61 - 1.24 mg/dL   Calcium 8.8 (L) 8.9 - 10.3 mg/dL   GFR, Estimated >86 >57 mL/min    Comment: (NOTE) Calculated using the CKD-EPI Creatinine Equation (2021)    Anion gap 16 (H) 5 - 15    Comment: Performed at [facility], [address], [place], [place] [address]  CBC     Status: Abnormal   Collection Time: 08/06/23  1:08 AM  Result Value Ref Range   WBC 20.2 (H) 4.0 - 10.5 K/uL   RBC 4.75 4.22 - 5.81 MIL/uL   Hemoglobin 15.5 13.0 - 17.0 g/dL   HCT 29.5 28.4 - 13.2 %   MCV 94.3 80.0 - 100.0 fL   MCH 32.6 26.0 - 34.0 pg   MCHC 34.6 30.0 - 36.0 g/dL   RDW 44.0 10.2 - 72.5 %   Platelets 291 150 - 400 K/uL   nRBC 0.0 0.0 - 0.2 %     Comment: Performed at [facility], [address], [place], [place] [address]  Troponin I (High Sensitivity)     Status: None   Collection Time: 08/06/23  1:08 AM  Result Value Ref Range   Troponin I (High Sensitivity) 6 <18 ng/L    Comment: (NOTE) Elevated high sensitivity troponin I (hsTnI) values and significant  changes across serial measurements may suggest ACS but many other  chronic and acute conditions are known to elevate hsTnI results.  Refer to the "Links" section for chest pain algorithms and additional  guidance. Performed at [facility], [address] [place], [place] [address]   Lipase, blood     Status: None   Collection Time: 08/06/23  1:08 AM  Result Value Ref Range   Lipase 25 11 - 51 U/L    Comment: Performed at [facility], [address] [place], [place] [address]  Urinalysis, Routine w reflex microscopic -Urine, Clean Catch     Status: Abnormal   Collection Time: 08/06/23  1:10 AM  Result Value Ref Range   Color, Urine YELLOW YELLOW   APPearance CLEAR CLEAR   Specific Gravity, Urine 1.011 1.005 - 1.030   pH 6.0 5.0 - 8.0   Glucose, UA NEGATIVE NEGATIVE mg/dL   Hgb urine dipstick NEGATIVE NEGATIVE   Bilirubin Urine NEGATIVE NEGATIVE   Ketones, ur 5 (A) NEGATIVE mg/dL   Protein, ur NEGATIVE NEGATIVE mg/dL   Nitrite NEGATIVE NEGATIVE   Leukocytes,Ua NEGATIVE NEGATIVE   RBC / HPF 0-5 0 - 5 RBC/hpf   WBC, UA 0-5 0 - 5 WBC/hpf   Bacteria, UA RARE (A) NONE SEEN   Squamous Epithelial / HPF 0-5 0 - 5 /HPF   Mucus PRESENT     Comment: Performed at [facility], [address], [place], [place] [address]  Troponin I (High Sensitivity)     Status: None   Collection Time: 08/06/23  3:46 AM  Result Value Ref Range   Troponin I (High Sensitivity) 7 <18 ng/L    Comment: (NOTE) Elevated high sensitivity troponin I (hsTnI) values and significant  changes across serial measurements may suggest ACS but many other  chronic and acute  conditions are known to elevate hsTnI results.  Refer to the "Links" section for chest pain algorithms and additional  guidance. Performed at [facility], [address] [place], [place] [address]  Hepatic function panel     Status: None   Collection Time: 08/06/23  3:46 AM  Result Value Ref Range   Total Protein 7.3 6.5 - 8.1 g/dL   Albumin 3.6 3.5 - 5.0 g/dL   AST 38 15 - 41 U/L   ALT 34 0 - 44 U/L   Alkaline Phosphatase 102 38 - 126 U/L   Total Bilirubin 0.8 0.3 - 1.2 mg/dL   Bilirubin, Direct 0.2 0.0 - 0.2 mg/dL   Indirect Bilirubin 0.6 0.3 - 0.9 mg/dL    Comment: Performed at [facility], [address] [place], [place] [address]   US Abdomen Limited RUQ (LIVER/GB)  Result Date: 08/06/2023 CLINICAL DATA:  67 year old male with history of epigastric pain. EXAM: ULTRASOUND ABDOMEN LIMITED RIGHT UPPER QUADRANT COMPARISON:  Right upper quadrant abdominal ultrasound 07/13/2019. FINDINGS: Gallbladder: No gallstones or wall thickening visualized. No sonographic Murphy sign noted by sonographer. Common bile duct: Diameter: 5.6 mm Liver: No focal lesion identified. Liver is diffusely heterogeneously echogenic, suggesting a background of cirrhosis. There is also a slight irregular contour of the liver, suggesting underlying cirrhosis. Portal vein is patent on color Doppler imaging with normal direction of blood flow towards the liver. Other: None. IMPRESSION: 1. No acute findings. Specifically, no gallstones and no evidence of acute cholecystitis. 2. Hepatic steatosis. 3. Nodular contour of the liver suggesting early changes of cirrhosis. Electronically Signed   By: Trudie Reed M.D.   On: 08/06/2023 07:59   CT Angio Chest/Abd/Pel for Dissection W and/or Wo Contrast  Result Date: 08/06/2023 CLINICAL DATA:  67 year old male with history of substernal chest pain radiating into the mid back and down to the umbilical region for 1 day. Clinical suspicion for acute aortic syndrome.  EXAM: CT ANGIOGRAPHY CHEST, ABDOMEN AND PELVIS TECHNIQUE: Non-contrast CT of the chest was initially obtained. Multidetector CT imaging through the chest, abdomen and pelvis was performed using the standard protocol during bolus administration of intravenous contrast. Multiplanar reconstructed images and MIPs were obtained and reviewed to evaluate the vascular anatomy. RADIATION DOSE REDUCTION: This exam was performed according to the departmental dose-optimization program which includes automated exposure control, adjustment of the mA and/or kV according to patient size and/or use of iterative reconstruction technique. CONTRAST:  OMNIPAQUE IOHEXOL 350 MG/ML SOLN COMPARISON:  No priors. FINDINGS: CTA CHEST FINDINGS Cardiovascular: Atherosclerotic calcifications are noted throughout the thoracic aorta. No definite coronary artery calcifications are noted. Precontrast images demonstrate no crescentic high attenuation associated with the wall of the thoracic aorta to indicate the presence of acute intramural hemorrhage. Postcontrast images demonstrate no aneurysm or dissection of the thoracic aorta. Ascending thoracic aorta, mid arch and descending thoracic aorta are normal in caliber measuring 3.0 cm, 2.5 cm and 2.5 cm in diameter respectively. Heart size is normal. There is no significant pericardial fluid, thickening or pericardial calcification. Mediastinum/Nodes: No pathologically enlarged mediastinal or hilar lymph nodes. Esophagus is unremarkable in appearance. No axillary lymphadenopathy. Lungs/Pleura: No acute consolidative airspace disease. No pleural effusions. No definite suspicious appearing pulmonary nodules or masses are noted. Mild diffuse bronchial wall thickening with mild centrilobular and paraseptal emphysema. Musculoskeletal: There are no aggressive appearing lytic or blastic lesions noted in the visualized portions of the skeleton. Review of the MIP images confirms the above findings. CTA  ABDOMEN AND PELVIS FINDINGS VASCULAR Aorta: Aortic atherosclerosis. Normal caliber aorta without aneurysm, dissection, vasculitis or significant stenosis. Celiac: Patent without evidence of aneurysm, dissection, vasculitis or significant stenosis. SMA: Patent without evidence of  aneurysm, dissection, vasculitis or significant stenosis. Renals: Both renal arteries are patent without evidence of aneurysm, dissection, vasculitis, fibromuscular dysplasia or significant stenosis. IMA: Patent without evidence of aneurysm, dissection, vasculitis or significant stenosis. Inflow: Patent without evidence of aneurysm, dissection, vasculitis or significant stenosis. Veins: No obvious venous abnormality within the limitations of this arterial phase study. Review of the MIP images confirms the above findings. NON-VASCULAR Hepatobiliary: No definite suspicious cystic or solid hepatic lesions are noted on today's arterial phase examination. There is diffuse low attenuation throughout the hepatic parenchyma, indicative of a background of hepatic steatosis. No intra or extrahepatic biliary ductal dilatation. Gallbladder is moderately distended. There is a suggestion of some stones and/or sludge lying dependently in the gallbladder. Trace amount of soft tissue stranding surrounding the gallbladder. Pancreas: No pancreatic mass. No pancreatic ductal dilatation. No pancreatic or peripancreatic fluid collections or inflammatory changes. Spleen: Unremarkable. Adrenals/Urinary Tract: Bilateral kidneys and bilateral adrenal glands are normal in appearance. No hydroureteronephrosis. Urinary bladder is moderately distended, but otherwise unremarkable in appearance. Stomach/Bowel: The appearance of the stomach is normal. Subtle soft tissue stranding is noted adjacent to the second portion of the duodenum. No pathologic dilatation of small bowel or colon. Normal appendix. Lymphatic: No lymphadenopathy noted in the abdomen or pelvis.  Reproductive: Prostate gland and seminal vesicles are unremarkable in appearance. Other: No significant volume of ascites. No pneumoperitoneum. Small umbilical hernia containing only omental fat. Musculoskeletal: There are no aggressive appearing lytic or blastic lesions noted in the visualized portions of the skeleton. Review of the MIP images confirms the above findings. IMPRESSION: 1. While there is aortic atherosclerosis, there are no findings to suggest acute aortic syndrome. 2. Gallbladder is moderately distended with a suggestion of some dependent stones and/or biliary sludge, as well as a trace amount of surrounding soft tissue stranding. These findings could indicate an acute cholecystitis. Further clinical evaluation is recommended. Consideration for follow-up right upper quadrant abdominal ultrasound is recommended if clinically appropriate. 3. Subtle inflammatory changes also noted adjacent to the second portion of the duodenal. If there is no evidence of acute cholecystitis, clinical correlation for signs and symptoms of potential duodenitis is suggested. 4. Hepatic steatosis. 5. Tiny umbilical hernia containing only omental fat. No associated bowel incarceration or obstruction at this time. 6. Mild diffuse bronchial wall thickening with mild centrilobular and paraseptal emphysema; imaging findings suggestive of underlying COPD. Electronically Signed   By: Trudie Reed M.D.   On: 08/06/2023 05:38   DG Chest 2 View  Result Date: 08/06/2023 CLINICAL DATA:  Chest pain. EXAM: CHEST - 2 VIEW COMPARISON:  Chest x-ray 03/12/2013. FINDINGS: The heart size and mediastinal contours are within normal limits. Both lungs are clear. The visualized skeletal structures are unremarkable. IMPRESSION: No active cardiopulmonary disease. Electronically Signed   By: Darliss Cheney M.D.   On: 08/06/2023 01:29    Pending Labs Unresulted Labs (From admission, onward)     Start     Ordered   08/07/23 0500  HIV  Antibody (routine testing w rflx)  (HIV Antibody (Routine testing w reflex) panel)  Tomorrow morning,   R        08/06/23 1235   08/07/23 0500  Comprehensive metabolic panel  Tomorrow morning,   R        08/06/23 1235   08/07/23 0500  CBC  Tomorrow morning,   R        08/06/23 1235   08/06/23 1235  Hemoglobin A1c  Add-on,   AD  08/06/23 1235   08/06/23 1233  CBC  (heparin)  Once,   R       Comments: Baseline for heparin therapy IF NOT ALREADY DRAWN.  Notify MD if PLT < 100 K.    08/06/23 1235   08/06/23 1233  Creatinine, serum  (heparin)  Once,   R       Comments: Baseline for heparin therapy IF NOT ALREADY DRAWN.    08/06/23 1235            Vitals/Pain Today's Vitals   08/06/23 1049 08/06/23 1100 08/06/23 1135 08/06/23 1200  BP: 115/60 105/70 104/67 117/71  Pulse: (!) 103 (!) 106 (!) 101 99  Resp:  15 (!) 21 (!) 22  Temp:      TempSrc:      SpO2:  90% 92% 92%  Weight:      Height:      PainSc:        Isolation Precautions No active isolations  Medications Medications  amLODipine (NORVASC) tablet 5 mg (has no administration in time range)  pravastatin (PRAVACHOL) tablet 80 mg (has no administration in time range)  gabapentin (NEURONTIN) capsule 100 mg (has no administration in time range)  loratadine (CLARITIN) tablet 10 mg (has no administration in time range)  heparin injection 5,000 Units (has no administration in time range)  dextrose 5 % and 0.45 % NaCl infusion (has no administration in time range)  acetaminophen (TYLENOL) tablet 650 mg (has no administration in time range)    Or  acetaminophen (TYLENOL) suppository 650 mg (has no administration in time range)  ketorolac (TORADOL) 30 MG/ML injection 30 mg (has no administration in time range)  traZODone (DESYREL) tablet 25 mg (has no administration in time range)  senna (SENOKOT) tablet 8.6 mg (has no administration in time range)  pantoprazole (PROTONIX) injection 40 mg (has no administration in time  range)  piperacillin-tazobactam (ZOSYN) IVPB 3.375 g (has no administration in time range)  morphine (PF) 4 MG/ML injection 4 mg (4 mg Intravenous Given 08/06/23 0345)  ondansetron (ZOFRAN) injection 4 mg (4 mg Intravenous Given 08/06/23 0345)  sodium chloride 0.9 % bolus 1,000 mL (0 mLs Intravenous Stopped 08/06/23 0506)  iohexol (OMNIPAQUE) 350 MG/ML injection 100 mL (100 mLs Intravenous Contrast Given 08/06/23 0404)  morphine (PF) 4 MG/ML injection 4 mg (4 mg Intravenous Given 08/06/23 0614)  pantoprazole (PROTONIX) injection 40 mg (40 mg Intravenous Given 08/06/23 0614)  piperacillin-tazobactam (ZOSYN) IVPB 3.375 g (0 g Intravenous Stopped 08/06/23 0757)  HYDROcodone-acetaminophen (NORCO/VICODIN) 5-325 MG per tablet 1 tablet (1 tablet Oral Given 08/06/23 1008)    Mobility walks     Focused Assessments G/U   R Recommendations: See Admitting Provider Note  Report given to:   Additional Notes:

## 2023-08-06 NOTE — H&P (Signed)
History and Physical    Kenneth Singleton Sr. ZOX:[id number] DOB: 1956/01/18 DOA: 08/06/2023  DOS: the patient was seen and examined on 08/06/2023  PCP: [name], MD   Patient coming from: Home  I have personally briefly reviewed patient's old medical records in [facility]  Mr. Kenneth Singleton, a 67 y/o followed for COPD, HTN, HLD, OA had the onset of abdominal pain Friday, 08/04/23 that was diffuse and persistent. He denies any nausea or vomiting, change in bowel habit except not having a BM since early Friday when the stool was the usual dark color due to vitamins he takes. He has had no fever or chills, no chest pain, no respiratory symptoms. He does drink 6 oz alcohol/day or more but reports he has never had any withdrawal symptomws. Due to continued pain he presents to [facility] for evaluation.   ED Course: 98.5  117/71  HR 99  rr 22. Overweight man in no acute distress at time of exam. Lab: K 3.8, LFTs nl, lipase 25, Tropoinin 6, 7. WBC 20.2 w/o diff. U/A negative. CTA chest - no PE, stable emphysema. CT abd/pel - distended GB, tissue stranding, hepatic steatosis. IN ED patient recieive Zosyn. EDP requested GI and GS consults. TRH called to admit for management of probable acute cholecystitis.   Review of Systems:  Review of Systems  Constitutional:  Negative for chills, fever and weight loss.  HENT: Negative.    Eyes: Negative.   Respiratory: Negative.    Cardiovascular:  Negative for chest pain, palpitations and leg swelling.  Gastrointestinal:  Positive for abdominal pain. Negative for blood in stool, diarrhea, heartburn, nausea and vomiting.  Genitourinary: Negative.   Musculoskeletal:  Positive for back pain and joint pain.  Skin: Negative.   Neurological: Negative.   Endo/Heme/Allergies:  Bruises/bleeds easily.  Psychiatric/Behavioral: Negative.      Past Medical History:  Diagnosis Date   Alcohol abuse    Arthritis    Colon polyps 05/03/2008   diminutive (hyperplastis)   COPD  (chronic obstructive pulmonary disease) (HCC)    Elevated LFTs    GERD (gastroesophageal reflux disease)    Hyperlipidemia    Hypertension     Past Surgical History:  Procedure Laterality Date   CARPAL TUNNEL RELEASE     bilateral    COLONOSCOPY  05/03/2008   [facility]   right ear surgery      due to infection    TOTAL HIP ARTHROPLASTY Left 04/03/2015   Procedure: LEFT TOTAL HIP ARTHROPLASTY ANTERIOR APPROACH;  Surgeon: Samson Frederic, MD;  Location: [facility];  Service: Orthopedics;  Laterality: Left;   VASECTOMY     Soc Hx -  married for a long time. Two sons, two grandchildren. Work - retired Engineer, [employer]." I-ADLs    reports that he quit smoking about 20 years ago. His smoking use included cigarettes. He started smoking about 50 years ago. He has been exposed to tobacco smoke. He has never used smokeless tobacco. He reports current alcohol use of about 42.0 standard drinks of alcohol per week. He reports that he does not use drugs.  Allergies  Allergen Reactions   Lisinopril Swelling   Lyrica [Pregabalin] Other (See Comments)    Blurred vision   Olmesartan Other (See Comments)    dizziness   Atorvastatin     REACTION: muscle aches    Family History  Problem Relation Age of Onset   Hypertension Father    Heart disease Father    Colon polyps Father  Hypertension Brother    Colon polyps Brother    Hypertension Sister    Lung cancer Sister    Alcohol abuse Other    Arthritis Other    Diabetes Other    Hyperlipidemia Other    Hypertension Other    Early death Neg Hx    Stroke Neg Hx    Colon cancer Neg Hx    Esophageal cancer Neg Hx    Stomach cancer Neg Hx    Rectal cancer Neg Hx     Prior to Admission medications   Medication Sig Start Date End Date Taking? Authorizing Provider  amLODipine (NORVASC) 5 MG tablet TAKE 1 TABLET (5 MG TOTAL) BY MOUTH DAILY. 05/29/23  Yes [name], MD  celecoxib (CELEBREX) 200 MG capsule TAKE 1 CAPSULE BY MOUTH EVERY DAY (NEED PRIMARY  INS) 06/21/23  Yes [name], MD  gabapentin (NEURONTIN) 100 MG capsule TAKE 1 CAPSULE BY MOUTH THREE TIMES A DAY 03/27/23  Yes [name], MD  levocetirizine (XYZAL) 5 MG tablet Take 1 tablet (5 mg total) by mouth every evening. 05/09/23  Yes [name], MD  Multiple Vitamins-Minerals (CENTRUM SILVER PO) Take 1 tablet by mouth daily.   Yes [provider]  Multiple Vitamins-Minerals (EYE VITAMINS PO) Take 2 capsules by mouth daily. EyePromise brand vitamins   Yes [provider]  omeprazole (PRILOSEC) 20 MG capsule Take 20 mg by mouth daily.   Yes [provider]  pravastatin (PRAVACHOL) 80 MG tablet TAKE 1 TABLET BY MOUTH EVERY DAY 06/21/23  Yes [name], MD    Physical Exam: Vitals:   08/06/23 1200 08/06/23 1230 08/06/23 1300 08/06/23 1301  BP: 117/71 117/69 131/73 131/73  Pulse: 99 99 (!) 104   Resp: (!) 22 15 (!) 23   Temp:      TempSrc:      SpO2: 92% 91% 92%   Weight:      Height:        Physical Exam Vitals and nursing note reviewed.  Constitutional:      General: He is not in acute distress.    Appearance: He is well-developed and normal weight. He is not ill-appearing or toxic-appearing.  HENT:     Head: Normocephalic and atraumatic.  Eyes:     Extraocular Movements: Extraocular movements intact.     Pupils: Pupils are equal, round, and reactive to light.  Neck:     Thyroid: No thyromegaly.  Cardiovascular:     Rate and Rhythm: Regular rhythm. Tachycardia present.     Pulses:          Carotid pulses are 2+ on the right side and 2+ on the left side.      Radial pulses are 2+ on the right side and 2+ on the left side.       Dorsalis pedis pulses are 2+ on the right side and 2+ on the left side.       Posterior tibial pulses are 2+ on the right side and 2+ on the left side.     Heart sounds: Normal heart sounds. No murmur heard. Pulmonary:     Effort: Pulmonary effort is normal. No tachypnea.     Breath sounds: Normal  breath sounds.  Chest:     Chest wall: No deformity or tenderness.  Abdominal:     Palpations: Abdomen is soft. There is no hepatomegaly or mass.     Tenderness: There is abdominal tenderness. There is guarding. There  is no rebound.     Comments: Umbilical hernia. MIld diffuse tenderness to percussion. Area of greatest tenderness to deep palpation RUQ. Very tender at RUQ to percussion. Mild guarding to exam RUQ only with no rebound tenderness.  Musculoskeletal:        General: Normal range of motion.     Cervical back: Normal range of motion and neck supple.     Right lower leg: No edema.     Left lower leg: No edema.  Skin:    General: Skin is warm and dry.  Neurological:     Mental Status: He is alert and oriented to person, place, and time.  Psychiatric:        Mood and Affect: Mood normal.        Behavior: Behavior normal.      Labs on Admission: I have personally reviewed following labs and imaging studies  CBC: Recent Labs  Lab 08/06/23 0108  WBC 20.2*  HGB 15.5  HCT 44.8  MCV 94.3  PLT 291   Basic Metabolic Panel: Recent Labs  Lab 08/06/23 0108  NA 131*  K 3.6  CL 94*  CO2 21*  GLUCOSE 134*  BUN 6*  CREATININE 0.90  CALCIUM 8.8*   GFR: Estimated Creatinine Clearance: 90.9 mL/min (by C-G formula based on SCr of 0.9 mg/dL). Liver Function Tests: Recent Labs  Lab 08/06/23 0346  AST 38  ALT 34  ALKPHOS 102  BILITOT 0.8  PROT 7.3  ALBUMIN 3.6   Recent Labs  Lab 08/06/23 0108  LIPASE 25   No results for input(s): "AMMONIA" in the last 168 hours. Coagulation Profile: No results for input(s): "INR", "PROTIME" in the last 168 hours. Cardiac Enzymes: No results for input(s): "CKTOTAL", "CKMB", "CKMBINDEX", "TROPONINI" in the last 168 hours. BNP (last 3 results) No results for input(s): "PROBNP" in the last 8760 hours. HbA1C: No results for input(s): "HGBA1C" in the last 72 hours. CBG: No results for input(s): "GLUCAP" in the last 168  hours. Lipid Profile: No results for input(s): "CHOL", "HDL", "LDLCALC", "TRIG", "CHOLHDL", "LDLDIRECT" in the last 72 hours. Thyroid Function Tests: No results for input(s): "TSH", "T4TOTAL", "FREET4", "T3FREE", "THYROIDAB" in the last 72 hours. Anemia Panel: No results for input(s): "VITAMINB12", "FOLATE", "FERRITIN", "TIBC", "IRON", "RETICCTPCT" in the last 72 hours. Urine analysis:    Component Value Date/Time   COLORURINE YELLOW 08/06/2023 0110   APPEARANCEUR CLEAR 08/06/2023 0110   LABSPEC 1.011 08/06/2023 0110   PHURINE 6.0 08/06/2023 0110   GLUCOSEU NEGATIVE 08/06/2023 0110   GLUCOSEU NEGATIVE 09/07/2021 1542   HGBUR NEGATIVE 08/06/2023 0110   BILIRUBINUR NEGATIVE 08/06/2023 0110   KETONESUR 5 (A) 08/06/2023 0110   PROTEINUR NEGATIVE 08/06/2023 0110   UROBILINOGEN 0.2 09/07/2021 1542   NITRITE NEGATIVE 08/06/2023 0110   LEUKOCYTESUR NEGATIVE 08/06/2023 0110    Radiological Exams on Admission: I have personally reviewed images US Abdomen Limited RUQ (LIVER/GB)  Result Date: 08/06/2023 CLINICAL DATA:  67 year old male with history of epigastric pain. EXAM: ULTRASOUND ABDOMEN LIMITED RIGHT UPPER QUADRANT COMPARISON:  Right upper quadrant abdominal ultrasound 07/13/2019. FINDINGS: Gallbladder: No gallstones or wall thickening visualized. No sonographic Murphy sign noted by sonographer. Common bile duct: Diameter: 5.6 mm Liver: No focal lesion identified. Liver is diffusely heterogeneously echogenic, suggesting a background of cirrhosis. There is also a slight irregular contour of the liver, suggesting underlying cirrhosis. Portal vein is patent on color Doppler imaging with normal direction of blood flow towards the liver. Other: None. IMPRESSION: 1. No acute  findings. Specifically, no gallstones and no evidence of acute cholecystitis. 2. Hepatic steatosis. 3. Nodular contour of the liver suggesting early changes of cirrhosis. Electronically Signed   By: Trudie Reed M.D.   On:  08/06/2023 07:59   CT Angio Chest/Abd/Pel for Dissection W and/or Wo Contrast  Result Date: 08/06/2023 CLINICAL DATA:  67 year old male with history of substernal chest pain radiating into the mid back and down to the umbilical region for 1 day. Clinical suspicion for acute aortic syndrome. EXAM: CT ANGIOGRAPHY CHEST, ABDOMEN AND PELVIS TECHNIQUE: Non-contrast CT of the chest was initially obtained. Multidetector CT imaging through the chest, abdomen and pelvis was performed using the standard protocol during bolus administration of intravenous contrast. Multiplanar reconstructed images and MIPs were obtained and reviewed to evaluate the vascular anatomy. RADIATION DOSE REDUCTION: This exam was performed according to the departmental dose-optimization program which includes automated exposure control, adjustment of the mA and/or kV according to patient size and/or use of iterative reconstruction technique. CONTRAST:  OMNIPAQUE IOHEXOL 350 MG/ML SOLN COMPARISON:  No priors. FINDINGS: CTA CHEST FINDINGS Cardiovascular: Atherosclerotic calcifications are noted throughout the thoracic aorta. No definite coronary artery calcifications are noted. Precontrast images demonstrate no crescentic high attenuation associated with the wall of the thoracic aorta to indicate the presence of acute intramural hemorrhage. Postcontrast images demonstrate no aneurysm or dissection of the thoracic aorta. Ascending thoracic aorta, mid arch and descending thoracic aorta are normal in caliber measuring 3.0 cm, 2.5 cm and 2.5 cm in diameter respectively. Heart size is normal. There is no significant pericardial fluid, thickening or pericardial calcification. Mediastinum/Nodes: No pathologically enlarged mediastinal or hilar lymph nodes. Esophagus is unremarkable in appearance. No axillary lymphadenopathy. Lungs/Pleura: No acute consolidative airspace disease. No pleural effusions. No definite suspicious appearing pulmonary nodules  or masses are noted. Mild diffuse bronchial wall thickening with mild centrilobular and paraseptal emphysema. Musculoskeletal: There are no aggressive appearing lytic or blastic lesions noted in the visualized portions of the skeleton. Review of the MIP images confirms the above findings. CTA ABDOMEN AND PELVIS FINDINGS VASCULAR Aorta: Aortic atherosclerosis. Normal caliber aorta without aneurysm, dissection, vasculitis or significant stenosis. Celiac: Patent without evidence of aneurysm, dissection, vasculitis or significant stenosis. SMA: Patent without evidence of aneurysm, dissection, vasculitis or significant stenosis. Renals: Both renal arteries are patent without evidence of aneurysm, dissection, vasculitis, fibromuscular dysplasia or significant stenosis. IMA: Patent without evidence of aneurysm, dissection, vasculitis or significant stenosis. Inflow: Patent without evidence of aneurysm, dissection, vasculitis or significant stenosis. Veins: No obvious venous abnormality within the limitations of this arterial phase study. Review of the MIP images confirms the above findings. NON-VASCULAR Hepatobiliary: No definite suspicious cystic or solid hepatic lesions are noted on today's arterial phase examination. There is diffuse low attenuation throughout the hepatic parenchyma, indicative of a background of hepatic steatosis. No intra or extrahepatic biliary ductal dilatation. Gallbladder is moderately distended. There is a suggestion of some stones and/or sludge lying dependently in the gallbladder. Trace amount of soft tissue stranding surrounding the gallbladder. Pancreas: No pancreatic mass. No pancreatic ductal dilatation. No pancreatic or peripancreatic fluid collections or inflammatory changes. Spleen: Unremarkable. Adrenals/Urinary Tract: Bilateral kidneys and bilateral adrenal glands are normal in appearance. No hydroureteronephrosis. Urinary bladder is moderately distended, but otherwise unremarkable in  appearance. Stomach/Bowel: The appearance of the stomach is normal. Subtle soft tissue stranding is noted adjacent to the second portion of the duodenum. No pathologic dilatation of small bowel or colon. Normal appendix. Lymphatic: No lymphadenopathy noted in the  abdomen or pelvis. Reproductive: Prostate gland and seminal vesicles are unremarkable in appearance. Other: No significant volume of ascites. No pneumoperitoneum. Small umbilical hernia containing only omental fat. Musculoskeletal: There are no aggressive appearing lytic or blastic lesions noted in the visualized portions of the skeleton. Review of the MIP images confirms the above findings. IMPRESSION: 1. While there is aortic atherosclerosis, there are no findings to suggest acute aortic syndrome. 2. Gallbladder is moderately distended with a suggestion of some dependent stones and/or biliary sludge, as well as a trace amount of surrounding soft tissue stranding. These findings could indicate an acute cholecystitis. Further clinical evaluation is recommended. Consideration for follow-up right upper quadrant abdominal ultrasound is recommended if clinically appropriate. 3. Subtle inflammatory changes also noted adjacent to the second portion of the duodenal. If there is no evidence of acute cholecystitis, clinical correlation for signs and symptoms of potential duodenitis is suggested. 4. Hepatic steatosis. 5. Tiny umbilical hernia containing only omental fat. No associated bowel incarceration or obstruction at this time. 6. Mild diffuse bronchial wall thickening with mild centrilobular and paraseptal emphysema; imaging findings suggestive of underlying COPD. Electronically Signed   By: Trudie Reed M.D.   On: 08/06/2023 05:38   DG Chest 2 View  Result Date: 08/06/2023 CLINICAL DATA:  Chest pain. EXAM: CHEST - 2 VIEW COMPARISON:  Chest x-ray 03/12/2013. FINDINGS: The heart size and mediastinal contours are within normal limits. Both lungs are clear.  The visualized skeletal structures are unremarkable. IMPRESSION: No active cardiopulmonary disease. Electronically Signed   By: Darliss Cheney M.D.   On: 08/06/2023 01:29    EKG: I have personally reviewed EKG: sinus tachycardia, no acute changes, no STEMI  Assessment/Plan Principal Problem:   Acute cholecystitis Active Problems:   Essential hypertension   COPD (chronic obstructive pulmonary disease) with chronic bronchitis (HCC)   Dyslipidemia, goal LDL below 130   Easy bruising    Assessment and Plan: * Acute cholecystitis >>ASSESSMENT AND PLAN FOR ABDOMINAL PAIN WRITTEN ON 08/06/2023  1:19 PM BY Abhi Moccia E, MD  Patient with persistent RUQ abdominal pain that does radiated to lower abdomen and back. Lab reveals nl LFTs, lipase,general chemistries. WBC 20.2 w/o diff. U/S upper abdomen reveals no cholelithiasis and nl CBD. CTA neg for PE. CT abd/pel reveals a distended GB, tissue stranding, hepatic steatosis, question of duodenal inflammation vs GB disease. Physical exam with RUQ tenderness greatest with percussion. Working diagnosis is acute cholecystitis.  Plan Med-surg admit  NPO except chips and sips with meds  Continue Zosyn q 6  GI consult requested by EDP - to determine need for MRCP or luminal investigation  GS consult requested by EDP - does not appear in need of immediate intervention  F/u lab in AM: Cmet, CBCD  Easy bruising No significant problem of late. He was seen by his PCP, Dr. Sanda Linger, for this problem who recommended cessation of alcohol  Dyslipidemia, goal LDL below 130 Patient with HLD. He takes and tolerates "statin" therapy  Plan Continue home medication  COPD (chronic obstructive pulmonary disease) with chronic bronchitis (HCC) Patient with emphysema by CTA chest. He denies any symptoms. He quit smoking 15 years ago. He takes no medications.   Essential hypertension BP well controlled at admission  Plan Continue home  medication       DVT prophylaxis: SQ Heparin Code Status: Full Code Family Communication: son present during interview and exam. Had no questions. Understands working dx and tx plan  Disposition Plan: home when medically  stable  Consults called: GI - consult placed by EDP; GS consult placed by EDP  Admission status: Observation, Med-Surg   Illene Regulus, MD [facility] 08/06/2023, 1:22 PM

## 2023-08-07 DIAGNOSIS — K810 Acute cholecystitis: Secondary | ICD-10-CM

## 2023-08-07 LAB — COMPREHENSIVE METABOLIC PANEL
ALT: 24 U/L (ref 0–44)
AST: 27 U/L (ref 15–41)
Albumin: 2.6 g/dL — ABNORMAL LOW (ref 3.5–5.0)
Alkaline Phosphatase: 83 U/L (ref 38–126)
Anion gap: 12 (ref 5–15)
BUN: 9 mg/dL (ref 8–23)
CO2: 23 mmol/L (ref 22–32)
Calcium: 8 mg/dL — ABNORMAL LOW (ref 8.9–10.3)
Chloride: 97 mmol/L — ABNORMAL LOW (ref 98–111)
Creatinine, Ser: 1.01 mg/dL (ref 0.61–1.24)
GFR, Estimated: >60 mL/min (ref >60)
Glucose, Bld: 134 mg/dL — ABNORMAL HIGH (ref 70–99)
Potassium: 2.9 mmol/L — ABNORMAL LOW (ref 3.5–5.1)
Sodium: 132 mmol/L — ABNORMAL LOW (ref 135–145)
Total Bilirubin: 0.5 mg/dL (ref 0.3–1.2)
Total Protein: 6 g/dL — ABNORMAL LOW (ref 6.5–8.1)

## 2023-08-07 LAB — CBC
HCT: 40.4 % (ref 39.0–52.0)
Hemoglobin: 13.9 g/dL (ref 13.0–17.0)
MCH: 34.1 pg — ABNORMAL HIGH (ref 26.0–34.0)
MCHC: 34.4 g/dL (ref 30.0–36.0)
MCV: 99 fL (ref 80.0–100.0)
Platelets: 204 10*3/uL (ref 150–400)
RBC: 4.08 MIL/uL — ABNORMAL LOW (ref 4.22–5.81)
RDW: 12.2 % (ref 11.5–15.5)
WBC: 15.9 10*3/uL — ABNORMAL HIGH (ref 4.0–10.5)
nRBC: 0 % (ref 0.0–0.2)

## 2023-08-07 LAB — MAGNESIUM: Magnesium: 2 mg/dL (ref 1.7–2.4)

## 2023-08-07 LAB — HIV ANTIBODY (ROUTINE TESTING W REFLEX): HIV Screen 4th Generation wRfx: NONREACTIVE

## 2023-08-07 LAB — PROCALCITONIN: Procalcitonin: 1.6 ng/mL

## 2023-08-07 MED ORDER — POTASSIUM CHLORIDE CRYS ER 20 MEQ PO TBCR
40.0000 meq | EXTENDED_RELEASE_TABLET | ORAL | Status: AC
  Administered 2023-08-07 (×2): 40 meq via ORAL
  Filled 2023-08-07 (×2): qty 2

## 2023-08-07 MED ORDER — SODIUM CHLORIDE 0.9 % IV SOLN: INTRAVENOUS | Status: DC

## 2023-08-07 NOTE — Progress Notes (Addendum)
PROGRESS NOTE    Kenneth GIANNOTTI Sr.  YQM:[id number] DOB: [birth year] DOA: 08/06/2023 PCP: Kenneth Grandchild, MD   Brief Narrative:  Kenneth Singleton, a 67 y/o followed for COPD, HTN, HLD, OA had the onset of abdominal pain Friday, 08/04/23 that was diffuse and persistent. He denies any nausea or vomiting, change in bowel habit except not having a BM since early Friday when the stool was the usual dark color due to vitamins he takes. He has had no fever or chills, no chest pain, no respiratory symptoms. He does drink 6 oz alcohol/day or more but reports he has never had any withdrawal symptomws. Due to continued pain he presents to [facility] for evaluation.    ED Course: 98.5  117/71  HR 99  rr 22. Overweight man in no acute distress at time of exam. Lab: K 3.8, LFTs nl, lipase 25, Tropoinin 6, 7. WBC 20.2 w/o diff. U/A negative. CTA chest - no PE, stable emphysema. CT abd/pel - distended GB, tissue stranding, hepatic steatosis. IN ED patient recieive Zosyn. EDP requested GI and GS consults. TRH called to admit for management of probable acute cholecystitis.   Assessment & Plan:   Principal Problem:   Acute cholecystitis Active Problems:   Essential hypertension   COPD (chronic obstructive pulmonary disease) with chronic bronchitis (HCC)   Dyslipidemia, goal LDL below 130   Easy bruising  Abdominal pain/acute duodenitis: Initial CT chest abdomen was concerning for possible acute cholecystitis however ultrasound abdomen ruled out gallstones and acute cholecystitis.  Leukocytosis improving.  Checking procalcitonin but based on the available data, doubt acute cholecystitis.  Will discontinue antibiotics once procalcitonin is resulted.  Reportedly, general surgery and GI was consulted however I do not see any notes from them yet.  If no cholecystitis, there is likely no need for general surgery or GI to see this patient.  His symptoms could very well be coming from duodenitis.  He is on Protonix twice daily.  Has history  of alcoholism which may be the culprit.  Update: Patient's procalcitonin is elevated.  Based on the fact that he still had right upper quadrant tenderness and came in with leukocytosis, there is still is some suspicion of possible acute cholecystitis.  I will continue him on clear liquid diet for now, ordered HIDA scan which will be done tomorrow, continue Zosyn and keep him n.p.o. from midnight.  Hypokalemia: Replace.  Check magnesium.  Hyperlipidemia: Continue pravastatin.  COPD: Stable.  Continue home medications.  Essential hypertension: Very well-controlled.  Continue home medications.  DVT prophylaxis: heparin injection 5,000 Units Start: 08/06/23 1400   Code Status: Full Code  Family Communication: Wife present at bedside.  Plan of care discussed with patient in length and he/she verbalized understanding and agreed with it.  Status is: Inpatient Remains inpatient appropriate because: Still with abdominal pain, slowly advancing diet.   Estimated body mass index is 34.46 kg/m as calculated from the following:   Height as of this encounter: 5\' 7"  (1.702 m).   Weight as of this encounter: 99.8 kg.    Nutritional Assessment: Body mass index is 34.46 kg/m.Marland Kitchen Seen by dietician.  I agree with the assessment and plan as outlined below: Nutrition Status:        . Skin Assessment: I have examined the patient's skin and I agree with the wound assessment as performed by the wound care RN as outlined below:    Consultants:  None  Procedures:  None  Antimicrobials:  Anti-infectives (From admission,  onward)    Start     Dose/Rate Route Frequency Ordered Stop   08/06/23 1400  piperacillin-tazobactam (ZOSYN) IVPB 3.375 g        3.375 g 12.5 mL/hr over 240 Minutes Intravenous Every 8 hours 08/06/23 1237     08/06/23 0715  piperacillin-tazobactam (ZOSYN) IVPB 3.375 g        3.375 g 100 mL/hr over 30 Minutes Intravenous  Once 08/06/23 0700 08/06/23 0757          Subjective: Patient seen and examined.  Wife at the bedside.  Patient states that his abdominal pain is improving.  No other complaint.  Objective: Vitals:   08/06/23 1330 08/06/23 1344 08/06/23 2015 08/07/23 0413  BP: 125/64 127/72 105/72 139/77  Pulse: 100 (!) 103 97 77  Resp: 15 16 16 14   Temp:  97.7 F (36.5 C) 98.5 F (36.9 C) 98.5 F (36.9 C)  TempSrc:  Oral Oral Oral  SpO2: 92% 95% 95% 100%  Weight:      Height:        Intake/Output Summary (Last 24 hours) at 08/07/2023 0755 Last data filed at 08/07/2023 0310 Gross per 24 hour  Intake 1130.58 ml  Output --  Net 1130.58 ml   Filed Weights   08/06/23 0107  Weight: 99.8 kg    Examination:  General exam: Appears calm and comfortable  Respiratory system: Clear to auscultation. Respiratory effort normal. Cardiovascular system: S1 & S2 heard, RRR. No JVD, murmurs, rubs, gallops or clicks. No pedal edema. Gastrointestinal system: Abdomen is nondistended, soft and mild right upper quadrant and right middle lobe pain. No organomegaly or masses felt. Normal bowel sounds heard. Central nervous system: Alert and oriented. No focal neurological deficits. Extremities: Symmetric 5 x 5 power. Skin: No rashes, lesions or ulcers Psychiatry: Judgement and insight appear normal. Mood & affect appropriate.    Data Reviewed: I have personally reviewed following labs and imaging studies  CBC: Recent Labs  Lab 08/06/23 0108 08/06/23 1516 08/07/23 0608  WBC 20.2* 20.3* 15.9*  HGB 15.5 14.7 13.9  HCT 44.8 43.1 40.4  MCV 94.3 96.6 99.0  PLT 291 220 204   Basic Metabolic Panel: Recent Labs  Lab 08/06/23 0108 08/06/23 1516 08/07/23 0608  NA 131*  --  132*  K 3.6  --  2.9*  CL 94*  --  97*  CO2 21*  --  23  GLUCOSE 134*  --  134*  BUN 6*  --  9  CREATININE 0.90 1.04 1.01  CALCIUM 8.8*  --  8.0*   GFR: Estimated Creatinine Clearance: 81 mL/min (by C-G formula based on SCr of 1.01 mg/dL). Liver Function  Tests: Recent Labs  Lab 08/06/23 0346 08/07/23 0608  AST 38 27  ALT 34 24  ALKPHOS 102 83  BILITOT 0.8 0.5  PROT 7.3 6.0*  ALBUMIN 3.6 2.6*   Recent Labs  Lab 08/06/23 0108  LIPASE 25   No results for input(s): "AMMONIA" in the last 168 hours. Coagulation Profile: No results for input(s): "INR", "PROTIME" in the last 168 hours. Cardiac Enzymes: No results for input(s): "CKTOTAL", "CKMB", "CKMBINDEX", "TROPONINI" in the last 168 hours. BNP (last 3 results) No results for input(s): "PROBNP" in the last 8760 hours. HbA1C: Recent Labs    08/06/23 0108  HGBA1C 5.2   CBG: No results for input(s): "GLUCAP" in the last 168 hours. Lipid Profile: No results for input(s): "CHOL", "HDL", "LDLCALC", "TRIG", "CHOLHDL", "LDLDIRECT" in the last 72 hours. Thyroid Function Tests:  No results for input(s): "TSH", "T4TOTAL", "FREET4", "T3FREE", "THYROIDAB" in the last 72 hours. Anemia Panel: No results for input(s): "VITAMINB12", "FOLATE", "FERRITIN", "TIBC", "IRON", "RETICCTPCT" in the last 72 hours. Sepsis Labs: No results for input(s): "PROCALCITON", "LATICACIDVEN" in the last 168 hours.  Recent Results (from the past 240 hour(s))  Surgical PCR screen     Status: None   Collection Time: 08/06/23  2:04 PM   Specimen: Nasal Mucosa; Nasal Swab  Result Value Ref Range Status   MRSA, PCR NEGATIVE NEGATIVE Final   Staphylococcus aureus NEGATIVE NEGATIVE Final    Comment: (NOTE) The Xpert SA Assay (FDA approved for NASAL specimens in patients 57 years of age and older), is one component of a comprehensive surveillance program. It is not intended to diagnose infection nor to guide or monitor treatment. Performed at [facility], [address] [place], [place] [address]      Radiology Studies: US Abdomen Limited RUQ (LIVER/GB)  Result Date: 08/06/2023 CLINICAL DATA:  67 year old male with history of epigastric pain. EXAM: ULTRASOUND ABDOMEN LIMITED RIGHT UPPER QUADRANT  COMPARISON:  Right upper quadrant abdominal ultrasound 07/13/2019. FINDINGS: Gallbladder: No gallstones or wall thickening visualized. No sonographic Murphy sign noted by sonographer. Common bile duct: Diameter: 5.6 mm Liver: No focal lesion identified. Liver is diffusely heterogeneously echogenic, suggesting a background of cirrhosis. There is also a slight irregular contour of the liver, suggesting underlying cirrhosis. Portal vein is patent on color Doppler imaging with normal direction of blood flow towards the liver. Other: None. IMPRESSION: 1. No acute findings. Specifically, no gallstones and no evidence of acute cholecystitis. 2. Hepatic steatosis. 3. Nodular contour of the liver suggesting early changes of cirrhosis. Electronically Signed   By: Trudie Reed M.D.   On: 08/06/2023 07:59   CT Angio Chest/Abd/Pel for Dissection W and/or Wo Contrast  Result Date: 08/06/2023 CLINICAL DATA:  67 year old male with history of substernal chest pain radiating into the mid back and down to the umbilical region for 1 day. Clinical suspicion for acute aortic syndrome. EXAM: CT ANGIOGRAPHY CHEST, ABDOMEN AND PELVIS TECHNIQUE: Non-contrast CT of the chest was initially obtained. Multidetector CT imaging through the chest, abdomen and pelvis was performed using the standard protocol during bolus administration of intravenous contrast. Multiplanar reconstructed images and MIPs were obtained and reviewed to evaluate the vascular anatomy. RADIATION DOSE REDUCTION: This exam was performed according to the departmental dose-optimization program which includes automated exposure control, adjustment of the mA and/or kV according to patient size and/or use of iterative reconstruction technique. CONTRAST:  OMNIPAQUE IOHEXOL 350 MG/ML SOLN COMPARISON:  No priors. FINDINGS: CTA CHEST FINDINGS Cardiovascular: Atherosclerotic calcifications are noted throughout the thoracic aorta. No definite coronary artery calcifications  are noted. Precontrast images demonstrate no crescentic high attenuation associated with the wall of the thoracic aorta to indicate the presence of acute intramural hemorrhage. Postcontrast images demonstrate no aneurysm or dissection of the thoracic aorta. Ascending thoracic aorta, mid arch and descending thoracic aorta are normal in caliber measuring 3.0 cm, 2.5 cm and 2.5 cm in diameter respectively. Heart size is normal. There is no significant pericardial fluid, thickening or pericardial calcification. Mediastinum/Nodes: No pathologically enlarged mediastinal or hilar lymph nodes. Esophagus is unremarkable in appearance. No axillary lymphadenopathy. Lungs/Pleura: No acute consolidative airspace disease. No pleural effusions. No definite suspicious appearing pulmonary nodules or masses are noted. Mild diffuse bronchial wall thickening with mild centrilobular and paraseptal emphysema. Musculoskeletal: There are no aggressive appearing lytic or blastic lesions noted in  the visualized portions of the skeleton. Review of the MIP images confirms the above findings. CTA ABDOMEN AND PELVIS FINDINGS VASCULAR Aorta: Aortic atherosclerosis. Normal caliber aorta without aneurysm, dissection, vasculitis or significant stenosis. Celiac: Patent without evidence of aneurysm, dissection, vasculitis or significant stenosis. SMA: Patent without evidence of aneurysm, dissection, vasculitis or significant stenosis. Renals: Both renal arteries are patent without evidence of aneurysm, dissection, vasculitis, fibromuscular dysplasia or significant stenosis. IMA: Patent without evidence of aneurysm, dissection, vasculitis or significant stenosis. Inflow: Patent without evidence of aneurysm, dissection, vasculitis or significant stenosis. Veins: No obvious venous abnormality within the limitations of this arterial phase study. Review of the MIP images confirms the above findings. NON-VASCULAR Hepatobiliary: No definite suspicious cystic  or solid hepatic lesions are noted on today's arterial phase examination. There is diffuse low attenuation throughout the hepatic parenchyma, indicative of a background of hepatic steatosis. No intra or extrahepatic biliary ductal dilatation. Gallbladder is moderately distended. There is a suggestion of some stones and/or sludge lying dependently in the gallbladder. Trace amount of soft tissue stranding surrounding the gallbladder. Pancreas: No pancreatic mass. No pancreatic ductal dilatation. No pancreatic or peripancreatic fluid collections or inflammatory changes. Spleen: Unremarkable. Adrenals/Urinary Tract: Bilateral kidneys and bilateral adrenal glands are normal in appearance. No hydroureteronephrosis. Urinary bladder is moderately distended, but otherwise unremarkable in appearance. Stomach/Bowel: The appearance of the stomach is normal. Subtle soft tissue stranding is noted adjacent to the second portion of the duodenum. No pathologic dilatation of small bowel or colon. Normal appendix. Lymphatic: No lymphadenopathy noted in the abdomen or pelvis. Reproductive: Prostate gland and seminal vesicles are unremarkable in appearance. Other: No significant volume of ascites. No pneumoperitoneum. Small umbilical hernia containing only omental fat. Musculoskeletal: There are no aggressive appearing lytic or blastic lesions noted in the visualized portions of the skeleton. Review of the MIP images confirms the above findings. IMPRESSION: 1. While there is aortic atherosclerosis, there are no findings to suggest acute aortic syndrome. 2. Gallbladder is moderately distended with a suggestion of some dependent stones and/or biliary sludge, as well as a trace amount of surrounding soft tissue stranding. These findings could indicate an acute cholecystitis. Further clinical evaluation is recommended. Consideration for follow-up right upper quadrant abdominal ultrasound is recommended if clinically appropriate. 3. Subtle  inflammatory changes also noted adjacent to the second portion of the duodenal. If there is no evidence of acute cholecystitis, clinical correlation for signs and symptoms of potential duodenitis is suggested. 4. Hepatic steatosis. 5. Tiny umbilical hernia containing only omental fat. No associated bowel incarceration or obstruction at this time. 6. Mild diffuse bronchial wall thickening with mild centrilobular and paraseptal emphysema; imaging findings suggestive of underlying COPD. Electronically Signed   By: Trudie Reed M.D.   On: 08/06/2023 05:38   DG Chest 2 View  Result Date: 08/06/2023 CLINICAL DATA:  Chest pain. EXAM: CHEST - 2 VIEW COMPARISON:  Chest x-ray 03/12/2013. FINDINGS: The heart size and mediastinal contours are within normal limits. Both lungs are clear. The visualized skeletal structures are unremarkable. IMPRESSION: No active cardiopulmonary disease. Electronically Signed   By: Darliss Cheney M.D.   On: 08/06/2023 01:29    Scheduled Meds:  amLODipine  5 mg Oral Daily   gabapentin  100 mg Oral TID   heparin  5,000 Units Subcutaneous Q8H   loratadine  10 mg Oral QPM   mupirocin ointment  1 Application Nasal BID   pantoprazole (PROTONIX) IV  40 mg Intravenous Q12H   potassium chloride  40  mEq Oral Q4H   pravastatin  80 mg Oral Daily   senna  1 tablet Oral BID   Continuous Infusions:  dextrose 5 % and 0.45 % NaCl 75 mL/hr at 08/07/23 0522   piperacillin-tazobactam 3.375 g (08/07/23 0518)     LOS: 1 day   Hughie Closs, MD [facility]  08/07/2023, 7:55 AM   *Please note that this is a verbal dictation therefore any spelling or grammatical errors are due to the "Dragon Medical One" system interpretation.  Please page via Amion and do not message via secure chat for urgent patient care matters. Secure chat can be used for non urgent patient care matters.  How to contact the [facility] Attending or Consulting provider 7A - 7P or covering provider during after hours 7P  -7A, for this patient?  Check the care team in [facility] and look for a) attending/consulting [facility] provider listed and b) the [facility] team listed. Page or secure chat 7A-7P. Log into [email or web address] and use [facility] universal password to access. If you do not have the password, please contact the hospital operator. Locate the [facility] provider you are looking for under [facility] and page to a number that you can be directly reached. If you still have difficulty reaching the provider, please page the [facility] (Director on Call) for the Hospitalists listed on amion for assistance.

## 2023-08-07 NOTE — Plan of Care (Signed)
   Problem: Pain Managment: Goal: General experience of comfort will improve Outcome: Progressing   Problem: Nutrition: Goal: Adequate nutrition will be maintained Outcome: Progressing   Problem: Safety: Goal: Ability to remain free from injury will improve Outcome: Progressing

## 2023-08-08 ENCOUNTER — Inpatient Hospital Stay (HOSPITAL_COMMUNITY): Payer: Medicare Other

## 2023-08-08 ENCOUNTER — Encounter (HOSPITAL_COMMUNITY)
Admission: EM | Disposition: A | Discharge status: Home / Self Care | Payer: Self-pay | Source: Home / Self Care | Attending: Internal Medicine

## 2023-08-08 ENCOUNTER — Encounter (HOSPITAL_COMMUNITY): Payer: Self-pay | Admitting: Internal Medicine

## 2023-08-08 DIAGNOSIS — K810 Acute cholecystitis: Secondary | ICD-10-CM | POA: Diagnosis not present

## 2023-08-08 DIAGNOSIS — G4730 Sleep apnea, unspecified: Secondary | ICD-10-CM

## 2023-08-08 HISTORY — PX: CHOLECYSTECTOMY: SHX55

## 2023-08-08 LAB — POCT I-STAT, CHEM 8
BUN: 8 mg/dL (ref 8–23)
Calcium, Ion: 1.05 mmol/L — ABNORMAL LOW (ref 1.15–1.40)
Chloride: 105 mmol/L (ref 98–111)
Creatinine, Ser: 0.7 mg/dL (ref 0.61–1.24)
Glucose, Bld: 87 mg/dL (ref 70–99)
HCT: 44 % (ref 39.0–52.0)
Hemoglobin: 15 g/dL (ref 13.0–17.0)
Potassium: 3.5 mmol/L (ref 3.5–5.1)
Sodium: 137 mmol/L (ref 135–145)
TCO2: 20 mmol/L — ABNORMAL LOW (ref 22–32)

## 2023-08-08 LAB — PHOSPHORUS: Phosphorus: 2.2 mg/dL — ABNORMAL LOW (ref 2.5–4.6)

## 2023-08-08 LAB — MAGNESIUM: Magnesium: 2.2 mg/dL (ref 1.7–2.4)

## 2023-08-08 SURGERY — LAPAROSCOPIC CHOLECYSTECTOMY
Anesthesia: General | Site: Abdomen

## 2023-08-08 MED ORDER — PROPOFOL 10 MG/ML IV BOLUS
INTRAVENOUS | Status: AC
  Filled 2023-08-08: qty 20

## 2023-08-08 MED ORDER — GUAIFENESIN 100 MG/5ML PO LIQD: 5.0000 mL | ORAL | Status: DC | PRN

## 2023-08-08 MED ORDER — ONDANSETRON HCL 4 MG/2ML IJ SOLN
INTRAMUSCULAR | Status: DC | PRN
Start: 2023-08-08 — End: 2023-08-08
  Administered 2023-08-08: 4 mg via INTRAVENOUS

## 2023-08-08 MED ORDER — ROCURONIUM BROMIDE 100 MG/10ML IV SOLN
INTRAVENOUS | Status: DC | PRN
Start: 2023-08-08 — End: 2023-08-08
  Administered 2023-08-08: 80 mg via INTRAVENOUS

## 2023-08-08 MED ORDER — SENNOSIDES-DOCUSATE SODIUM 8.6-50 MG PO TABS: 1.0000 | ORAL_TABLET | Freq: Every evening | ORAL | Status: DC | PRN

## 2023-08-08 MED ORDER — SUGAMMADEX SODIUM 200 MG/2ML IV SOLN
INTRAVENOUS | Status: DC | PRN
Start: 2023-08-08 — End: 2023-08-08
  Administered 2023-08-08: 200 mg via INTRAVENOUS

## 2023-08-08 MED ORDER — ACETAMINOPHEN 10 MG/ML IV SOLN: 1000.0000 mg | Freq: Once | INTRAVENOUS | Status: DC | PRN

## 2023-08-08 MED ORDER — FENTANYL CITRATE (PF) 250 MCG/5ML IJ SOLN
INTRAMUSCULAR | Status: AC
  Filled 2023-08-08: qty 5

## 2023-08-08 MED ORDER — DEXAMETHASONE SODIUM PHOSPHATE 10 MG/ML IJ SOLN
INTRAMUSCULAR | Status: AC
  Filled 2023-08-08: qty 1

## 2023-08-08 MED ORDER — CHLORHEXIDINE GLUCONATE 0.12 % MT SOLN
OROMUCOSAL | Status: AC
  Administered 2023-08-08: 15 mL via OROMUCOSAL
  Filled 2023-08-08: qty 15

## 2023-08-08 MED ORDER — ONDANSETRON HCL 4 MG/2ML IJ SOLN
INTRAMUSCULAR | Status: AC
  Filled 2023-08-08: qty 2

## 2023-08-08 MED ORDER — HYDRALAZINE HCL 20 MG/ML IJ SOLN: 10.0000 mg | INTRAMUSCULAR | Status: DC | PRN

## 2023-08-08 MED ORDER — METHOCARBAMOL 500 MG PO TABS: 500.0000 mg | ORAL_TABLET | Freq: Four times a day (QID) | ORAL | Status: DC | PRN

## 2023-08-08 MED ORDER — POTASSIUM CHLORIDE 10 MEQ/100ML IV SOLN
10.0000 meq | INTRAVENOUS | Status: AC
  Administered 2023-08-08 (×2): 10 meq via INTRAVENOUS
  Filled 2023-08-08 (×2): qty 100

## 2023-08-08 MED ORDER — LIDOCAINE HCL (CARDIAC) PF 100 MG/5ML IV SOSY
PREFILLED_SYRINGE | INTRAVENOUS | Status: DC | PRN
Start: 2023-08-08 — End: 2023-08-08
  Administered 2023-08-08: 100 mg via INTRAVENOUS

## 2023-08-08 MED ORDER — LIDOCAINE 2% (20 MG/ML) 5 ML SYRINGE
INTRAMUSCULAR | Status: AC
  Filled 2023-08-08: qty 5

## 2023-08-08 MED ORDER — SODIUM CHLORIDE 0.9 % IV SOLN: INTRAVENOUS | Status: AC

## 2023-08-08 MED ORDER — PANTOPRAZOLE SODIUM 40 MG IV SOLR
40.0000 mg | INTRAVENOUS | Status: DC
  Administered 2023-08-08 – 2023-08-09 (×2): 40 mg via INTRAVENOUS
  Filled 2023-08-08 (×2): qty 10

## 2023-08-08 MED ORDER — ORAL CARE MOUTH RINSE: 15.0000 mL | Freq: Once | OROMUCOSAL | Status: AC

## 2023-08-08 MED ORDER — TRAMADOL HCL 50 MG PO TABS
50.0000 mg | ORAL_TABLET | Freq: Four times a day (QID) | ORAL | Status: DC | PRN
  Administered 2023-08-08 – 2023-08-09 (×2): 100 mg via ORAL
  Filled 2023-08-08: qty 2
  Filled 2023-08-08: qty 1
  Filled 2023-08-08: qty 2

## 2023-08-08 MED ORDER — LACTATED RINGERS IV SOLN: INTRAVENOUS | Status: DC

## 2023-08-08 MED ORDER — MORPHINE SULFATE (PF) 2 MG/ML IV SOLN
INTRAVENOUS | Status: AC
  Filled 2023-08-08: qty 1

## 2023-08-08 MED ORDER — FENTANYL CITRATE (PF) 100 MCG/2ML IJ SOLN: 25.0000 ug | INTRAMUSCULAR | Status: DC | PRN

## 2023-08-08 MED ORDER — CHLORHEXIDINE GLUCONATE 0.12 % MT SOLN: 15.0000 mL | Freq: Once | OROMUCOSAL | Status: AC

## 2023-08-08 MED ORDER — IPRATROPIUM-ALBUTEROL 0.5-2.5 (3) MG/3ML IN SOLN: 3.0000 mL | RESPIRATORY_TRACT | Status: DC | PRN

## 2023-08-08 MED ORDER — BUPIVACAINE-EPINEPHRINE 0.25% -1:200000 IJ SOLN
INTRAMUSCULAR | Status: DC | PRN
  Administered 2023-08-08: 30 mL

## 2023-08-08 MED ORDER — OXYCODONE HCL 5 MG PO TABS: 5.0000 mg | ORAL_TABLET | Freq: Once | ORAL | Status: DC | PRN

## 2023-08-08 MED ORDER — ONDANSETRON HCL 4 MG/2ML IJ SOLN: 4.0000 mg | Freq: Four times a day (QID) | INTRAMUSCULAR | Status: DC | PRN

## 2023-08-08 MED ORDER — ACETAMINOPHEN 500 MG PO TABS
1000.0000 mg | ORAL_TABLET | Freq: Three times a day (TID) | ORAL | Status: DC
  Administered 2023-08-08 – 2023-08-09 (×3): 1000 mg via ORAL
  Filled 2023-08-08 (×3): qty 2

## 2023-08-08 MED ORDER — PHENYLEPHRINE HCL (PRESSORS) 10 MG/ML IV SOLN
INTRAVENOUS | Status: DC | PRN
Start: 2023-08-08 — End: 2023-08-08
  Administered 2023-08-08 (×2): 80 ug via INTRAVENOUS

## 2023-08-08 MED ORDER — BUPIVACAINE-EPINEPHRINE (PF) 0.25% -1:200000 IJ SOLN
INTRAMUSCULAR | Status: AC
  Filled 2023-08-08: qty 30

## 2023-08-08 MED ORDER — TECHNETIUM TC 99M MEBROFENIN IV KIT
5.0000 | PACK | Freq: Once | INTRAVENOUS | Status: AC | PRN
  Administered 2023-08-08: 5 via INTRAVENOUS

## 2023-08-08 MED ORDER — PROPOFOL 500 MG/50ML IV EMUL
INTRAVENOUS | Status: DC | PRN
Start: 2023-08-08 — End: 2023-08-08
  Administered 2023-08-08: 150 mg via INTRAVENOUS

## 2023-08-08 MED ORDER — SODIUM CHLORIDE 0.9 % IV SOLN: INTRAVENOUS | Status: DC

## 2023-08-08 MED ORDER — DEXAMETHASONE SODIUM PHOSPHATE 10 MG/ML IJ SOLN
INTRAMUSCULAR | Status: DC | PRN
  Administered 2023-08-08: 10 mg via INTRAVENOUS

## 2023-08-08 MED ORDER — FENTANYL CITRATE (PF) 100 MCG/2ML IJ SOLN
INTRAMUSCULAR | Status: DC | PRN
Start: 2023-08-08 — End: 2023-08-08
  Administered 2023-08-08 (×2): 50 ug via INTRAVENOUS

## 2023-08-08 MED ORDER — OXYCODONE HCL 5 MG/5ML PO SOLN: 5.0000 mg | Freq: Once | ORAL | Status: DC | PRN

## 2023-08-08 MED ORDER — MORPHINE SULFATE (PF) 2 MG/ML IV SOLN
2.0000 mg | Freq: Once | INTRAVENOUS | Status: AC
  Administered 2023-08-08: 2 mg via INTRAVENOUS

## 2023-08-08 MED ORDER — METOPROLOL TARTRATE 5 MG/5ML IV SOLN: 5.0000 mg | INTRAVENOUS | Status: DC | PRN

## 2023-08-08 MED ORDER — ACETAMINOPHEN 10 MG/ML IV SOLN
INTRAVENOUS | Status: DC | PRN
Start: 2023-08-08 — End: 2023-08-08
  Administered 2023-08-08: 1000 mg via INTRAVENOUS

## 2023-08-08 MED ORDER — SODIUM CHLORIDE 0.9 % IR SOLN
Status: DC | PRN
  Administered 2023-08-08: 1000 mL

## 2023-08-08 MED ORDER — SUCCINYLCHOLINE CHLORIDE 200 MG/10ML IV SOSY
PREFILLED_SYRINGE | INTRAVENOUS | Status: AC
  Filled 2023-08-08: qty 10

## 2023-08-08 MED ORDER — ROCURONIUM BROMIDE 10 MG/ML (PF) SYRINGE
PREFILLED_SYRINGE | INTRAVENOUS | Status: AC
  Filled 2023-08-08: qty 10

## 2023-08-08 MED ORDER — DROPERIDOL 2.5 MG/ML IJ SOLN: 0.6250 mg | Freq: Once | INTRAMUSCULAR | Status: DC | PRN

## 2023-08-08 MED ORDER — 0.9 % SODIUM CHLORIDE (POUR BTL) OPTIME
TOPICAL | Status: DC | PRN
  Administered 2023-08-08: 1000 mL

## 2023-08-08 SURGICAL SUPPLY — 54 items
ADH SKN CLS APL DERMABOND .7 (GAUZE/BANDAGES/DRESSINGS) ×1
APL PRP STRL LF DISP 70% ISPRP (MISCELLANEOUS) ×1
APPLIER CLIP 5 13 M/L LIGAMAX5 (MISCELLANEOUS) ×1
APR CLP MED LRG 5 ANG JAW (MISCELLANEOUS) ×1
BAG COUNTER SPONGE SURGICOUNT (BAG) ×1 IMPLANT
BAG SPEC RTRVL 10 TROC 200 (ENDOMECHANICALS) ×1
BAG SPNG CNTER NS LX DISP (BAG) ×1
BIOPATCH RED 1 DISK 7.0 (GAUZE/BANDAGES/DRESSINGS) IMPLANT
BLADE CLIPPER SURG (BLADE) IMPLANT
CANISTER SUCT 3000ML PPV (MISCELLANEOUS) ×1 IMPLANT
CHLORAPREP W/TINT 26 (MISCELLANEOUS) ×1 IMPLANT
CLIP APPLIE 5 13 M/L LIGAMAX5 (MISCELLANEOUS) ×1 IMPLANT
COVER SURGICAL LIGHT HANDLE (MISCELLANEOUS) ×1 IMPLANT
DERMABOND ADVANCED .7 DNX12 (GAUZE/BANDAGES/DRESSINGS) ×1 IMPLANT
DRAIN CHANNEL 19F RND (DRAIN) IMPLANT
DRAIN RELI 100 BL SUC LF ST (DRAIN) ×1
DRSG TEGADERM 4X4.75 (GAUZE/BANDAGES/DRESSINGS) IMPLANT
ELECT REM PT RETURN 9FT ADLT (ELECTROSURGICAL) ×1
ELECTRODE REM PT RTRN 9FT ADLT (ELECTROSURGICAL) ×1 IMPLANT
ENDOLOOP SUT PDS II 0 18 (SUTURE) IMPLANT
EVACUATOR SILICONE 100CC (DRAIN) IMPLANT
GAUZE SPONGE 2X2 STRL 8-PLY (GAUZE/BANDAGES/DRESSINGS) IMPLANT
GLOVE BIOGEL PI IND STRL 6 (GLOVE) ×1 IMPLANT
GLOVE BIOGEL PI MICRO STRL 5.5 (GLOVE) ×1 IMPLANT
GOWN STRL REUS W/ TWL LRG LVL3 (GOWN DISPOSABLE) ×3 IMPLANT
GOWN STRL REUS W/TWL LRG LVL3 (GOWN DISPOSABLE) ×3
IRRIG SUCT STRYKERFLOW 2 WTIP (MISCELLANEOUS) ×1
IRRIGATION SUCT STRKRFLW 2 WTP (MISCELLANEOUS) ×1 IMPLANT
KIT BASIN OR (CUSTOM PROCEDURE TRAY) ×1 IMPLANT
KIT TURNOVER KIT B (KITS) ×1 IMPLANT
L-HOOK LAP DISP 36CM (ELECTROSURGICAL) ×1
LHOOK LAP DISP 36CM (ELECTROSURGICAL) ×1 IMPLANT
NDL INSUFFLATION 14GA 120MM (NEEDLE) IMPLANT
NEEDLE INSUFFLATION 14GA 120MM (NEEDLE) IMPLANT
NS IRRIG 1000ML POUR BTL (IV SOLUTION) ×1 IMPLANT
PAD ARMBOARD 7.5X6 YLW CONV (MISCELLANEOUS) ×1 IMPLANT
PENCIL BUTTON HOLSTER BLD 10FT (ELECTRODE) ×1 IMPLANT
POUCH RETRIEVAL ECOSAC 10 (ENDOMECHANICALS) IMPLANT
SCISSORS LAP 5X35 DISP (ENDOMECHANICALS) ×1 IMPLANT
SET TUBE SMOKE EVAC HIGH FLOW (TUBING) ×1 IMPLANT
SLEEVE Z-THREAD 5X100MM (TROCAR) ×2 IMPLANT
SUT ETHILON 2 0 FS 18 (SUTURE) IMPLANT
SUT MNCRL AB 4-0 PS2 18 (SUTURE) ×1 IMPLANT
SUT VIC AB 3-0 SH 27 (SUTURE) ×1
SUT VIC AB 3-0 SH 27X BRD (SUTURE) IMPLANT
SUT VICRYL 0 UR6 27IN ABS (SUTURE) IMPLANT
TOWEL GREEN STERILE (TOWEL DISPOSABLE) ×1 IMPLANT
TOWEL GREEN STERILE FF (TOWEL DISPOSABLE) ×1 IMPLANT
TRAY LAPAROSCOPIC MC (CUSTOM PROCEDURE TRAY) ×1 IMPLANT
TROCAR BALLN 12MMX100 BLUNT (TROCAR) ×1 IMPLANT
TROCAR Z THREAD OPTICAL 12X100 (TROCAR) IMPLANT
TROCAR Z-THREAD OPTICAL 5X100M (TROCAR) ×1 IMPLANT
WARMER LAPAROSCOPE (MISCELLANEOUS) ×1 IMPLANT
WATER STERILE IRR 1000ML POUR (IV SOLUTION) ×1 IMPLANT

## 2023-08-08 NOTE — Anesthesia Preprocedure Evaluation (Signed)
Anesthesia Evaluation  Patient identified by MRN, date of birth, ID band Patient awake    Reviewed: Allergy & Precautions, H&P , NPO status , Patient's Chart, lab work & pertinent test results  Airway Mallampati: III  TM Distance: >3 FB Neck ROM: Full    Dental no notable dental hx.    Pulmonary sleep apnea , COPD, former smoker   Pulmonary exam normal breath sounds clear to auscultation       Cardiovascular hypertension, negative cardio ROS Normal cardiovascular exam Rhythm:Regular Rate:Normal     Neuro/Psych negative neurological ROS  negative psych ROS   GI/Hepatic ,GERD  ,,(+) Hepatitis -  Endo/Other  negative endocrine ROS    Renal/GU negative Renal ROS  negative genitourinary   Musculoskeletal  (+) Arthritis ,    Abdominal   Peds negative pediatric ROS (+)  Hematology negative hematology ROS (+)   Anesthesia Other Findings   Reproductive/Obstetrics negative OB ROS                             Anesthesia Physical Anesthesia Plan  ASA: 3  Anesthesia Plan: General   Post-op Pain Management:    Induction: Intravenous  PONV Risk Score and Plan: Dexamethasone and Ondansetron  Airway Management Planned: Oral ETT  Additional Equipment:   Intra-op Plan:   Post-operative Plan: Extubation in OR  Informed Consent: I have reviewed the patients History and Physical, chart, labs and discussed the procedure including the risks, benefits and alternatives for the proposed anesthesia with the patient or authorized representative who has indicated his/her understanding and acceptance.     Dental advisory given  Plan Discussed with: CRNA  Anesthesia Plan Comments:        Anesthesia Quick Evaluation

## 2023-08-08 NOTE — Op Note (Signed)
Date: 08/08/23  Patient: Kenneth Singleton [facility] MRN: [id number]  Preoperative Diagnosis: Acute cholecystitis Postoperative Diagnosis: Same  Procedure: Laparoscopic cholecystectomy  Surgeon: Sophronia Simas, MD Assistant: Hosie Spangle, PA-C  EBL: 50 mL  Anesthesia: General endotracheal  Specimens: Gallbladder  Indications: Kenneth Singleton is a 67 yo male who presented with acute onset upper abdominal pain, which is now localized to the RUQ. A CT was suspicious for cholecystitis vs duodenitis, and a HIDA scan showed no filling of the gallbladder. After a discussion of the risks and benefits of surgery, he agreed to proceed with cholecystectomy.  Findings: Cholelithiasis with acute gangrenous cholecystitis and purulent fluid within the gallbladder. Hepatic steatosis without cirrhosis. 19-Fr JP drain left in the gallbladder fossa.  Procedure details: Informed consent was obtained in the preoperative area prior to the procedure. The patient was brought to the operating room and placed on the table in the supine position. General anesthesia was induced and appropriate lines and drains were placed for intraoperative monitoring. Perioperative antibiotics were administered per SCIP guidelines. The abdomen was prepped and draped in the usual sterile fashion. A pre-procedure timeout was taken verifying patient identity, surgical site and procedure to be performed.  A small supraumbilical skin incision was made and the subcutaneous tissue was divided with cautery. There was a small umbilical hernia. The umbilical stalk was circumferentially dissected out, and the umbilical skin was taken off the hernia sac sharply. The hernia sac was opened and taken off the fascia with cautery. A Hassan trocar was inserted through the hernia defect, which was approximately 1cm in diameter, and the abdomen was insufflated. The peritoneal cavity was inspected with no evidence of visceral or vascular injury. Three 5mm ports were placed  in the right subcostal margin, all under direct visualization. The omentum was initially covering the gallbladder, and was swept caudad. The fundus of the gallbladder was then visible, and appeared very thickened and inflamed. The omentum was adherent to the gallbladder wall, and was swept down off the gallbladder bluntly. The gallbladder was very distended and gangrenous. Needle decompression was performed to facilitate retraction of the gallbladder, and thick purulent fluid was evacuated from the gallbladder. The fundus of the gallbladder was grasped and retracted cephalad. The cystic triangle was covered in fatty tissue, which was bluntly swept medially. This exposed the cystic artery, which was circumferentially dissected out. The cystic artery was clipped close to the gallbladder and divided with cautery. The infundibulum of the gallbladder was retracted laterally. The cystic triangle was further dissected out using blunt dissection and cautery. The cystic duct was circumferentially dissected out, and the lower third of the gallbladder was taken off the liver using cautery. At this point the cystic duct was clearly visible entering the gallbladder, with no other structures going into the gallbladder. The cystic duct was sharply divided very close to the gallbladder, and the cystic duct stump was closed using a PDS endoloop.  The gallbladder was taken off the liver with cautery, and the specimen was placed in an Ecosac. Small stones were present in the gallbladder. The surgical site was copiously irrigated with saline until the effluent was clear. Hemostasis was achieved in the gallbladder fossa using cautery. The cystic duct and artery stumps were visually inspected and there was no evidence of bile leak or bleeding. Because the tissue was very friable, the decision was made to leave a drain in place in case the patient develops a cystic duct stump leak. A 19-Fr JP drain was placed adjacent to  the gallbladder  fossa and brought out through the right lateral port site, and secured to the skin with 2-0 Nylon suture. The remaining ports were removed and the abdomen was desufflated. The specimen was extracted via the umbilical port site, which required extension of the fascial defect. The umbilical port site fascia was closed with 0 Vicryl figure-of-eight sutures. The overlying skin was tacked down to the fascia with a 3-0 Vicryl suture, and the deep dermis was reapproximated with interrupted 3-0 Vicryl sutures. The skin at all port sites was closed with 4-0 monocryl subcuticular suture. Dermabond was applied.  The patient tolerated the procedure well with no apparent complications. All counts were correct x2 at the end of the procedure. The patient was extubated and taken to PACU in stable condition.  Sophronia Simas, MD 08/08/23 3:54 PM

## 2023-08-08 NOTE — Transfer of Care (Signed)
Immediate Anesthesia Transfer of Care Note  Patient: Kenneth Singleton.  Procedure(s) Performed: LAPAROSCOPIC CHOLECYSTECTOMY (Abdomen)  Patient Location: PACU  Anesthesia Type:General  Level of Consciousness: awake, alert , and oriented  Airway & Oxygen Therapy: Patient Spontanous Breathing and Patient connected to face mask oxygen  Post-op Assessment: Report given to RN and Post -op Vital signs reviewed and stable  Post vital signs: Reviewed and stable  Last Vitals:  Vitals Value Taken Time  BP 107/62 08/08/23 1600  Temp 36.5 C 08/08/23 1600  Pulse 89 08/08/23 1600  Resp 16 08/08/23 1600  SpO2 91 % 08/08/23 1600  Vitals shown include unfiled device data.  Last Pain:  Vitals:   08/08/23 1350  TempSrc:   PainSc: 3       Patients Stated Pain Goal: 3 (08/08/23 1350)  Complications: No notable events documented.

## 2023-08-08 NOTE — Anesthesia Procedure Notes (Addendum)
Procedure Name: Intubation Date/Time: 08/08/2023 2:41 PM  Performed by: Pincus Large, CRNAPre-anesthesia Checklist: Patient identified, Emergency Drugs available, Suction available and Patient being monitored Patient Re-evaluated:Patient Re-evaluated prior to induction Oxygen Delivery Method: Circle System Utilized Preoxygenation: Pre-oxygenation with 100% oxygen Induction Type: IV induction Ventilation: Mask ventilation without difficulty Tube type: Oral Number of attempts: 1 Airway Equipment and Method: Stylet and Oral airway Placement Confirmation: ETT inserted through vocal cords under direct vision, positive ETCO2 and breath sounds checked- equal and bilateral Tube secured with: Tape Dental Injury: Teeth and Oropharynx as per pre-operative assessment

## 2023-08-08 NOTE — Progress Notes (Signed)
PROGRESS NOTE    Kenneth Singleton.  ZOX:[id number] DOB: 03/14/1956 DOA: 08/06/2023 PCP: [name], MD    Brief Narrative:  67 y/o followed for COPD, HTN, HLD, OA had the onset of abdominal pain Friday, 08/04/23 that was diffuse and persistent. He denies any nausea or vomiting, change in bowel habit except not having a BM since early Friday when the stool was the usual dark color due to vitamins he takes.  Upon admission CT scan showed concerns of possible acute cholecystitis/duodenitis.    Assessment & Plan:   Principal Problem:   Acute cholecystitis Active Problems:   Essential hypertension   COPD (chronic obstructive pulmonary disease) with chronic bronchitis (HCC)   Dyslipidemia, goal LDL below 130   Easy bruising   Abdominal pain/acute duodenitis Acute cholecystitis -Distended gallbladder abdomen and mild duodenitis seen on the CT chest abdomen pelvis.  Right upper quadrant ultrasound is negative.  LFTs and lipase are negative.  HIDA scan shows concern for acute cholecystitis.  Will consult [facility] to see if patient would benefit from MRCP/ERCP versus proceeding with lap cholecystitis with IOP -Currently on empiric Zosyn   Hypokalemia -Replete.  Check phosphorus and magnesium   Hyperlipidemia -Continue pravastatin.   COPD -As needed bronchodilators   Essential hypertension -Continue home meds.  IV as needed   DVT prophylaxis: heparin injection 5,000 Units Start: 08/06/23 1400   Code Status: Full Code  Family Communication: Wife present at bedside.     Status is: Inpatient Remains inpatient appropriate because: Ongoing evaluation for abdominal pain      Subjective:  Seen at bedside after his HIDA scan.  States his pain is minimal this morning.  He tolerated liquid diet yesterday  Examination:  General exam: Appears calm and comfortable  Respiratory system: Clear to auscultation. Respiratory effort normal. Cardiovascular system: S1 & S2 heard, RRR. No JVD,  murmurs, rubs, gallops or clicks. No pedal edema. Gastrointestinal system: Abdomen is nondistended, soft and nontender. No organomegaly or masses felt. Normal bowel sounds heard. Central nervous system: Alert and oriented. No focal neurological deficits. Extremities: Symmetric 5 x 5 power. Skin: No rashes, lesions or ulcers Psychiatry: Judgement and insight appear normal. Mood & affect appropriate.       Diet Orders (From admission, onward)     Start     Ordered   08/08/23 0001  Diet NPO time specified  Diet effective midnight        08/07/23 1239            Objective: Vitals:   08/08/23 0522 08/08/23 1010 08/08/23 1025 08/08/23 1143  BP: 130/71 134/77 (!) 140/70 133/75  Pulse: 96 91 90 91  Resp: 18 16 16 17   Temp: 98.6 F (37 C)   98 F (36.7 C)  TempSrc: Oral   Oral  SpO2: 91%   98%  Weight:      Height:        Intake/Output Summary (Last 24 hours) at 08/08/2023 1208 Last data filed at 08/08/2023 0800 Gross per 24 hour  Intake 496.61 ml  Output --  Net 496.61 ml   Filed Weights   08/06/23 0107  Weight: 99.8 kg    Scheduled Meds:  amLODipine  5 mg Oral Daily   gabapentin  100 mg Oral TID   heparin  5,000 Units Subcutaneous Q8H   loratadine  10 mg Oral QPM   mupirocin ointment  1 Application Nasal BID   pantoprazole (PROTONIX) IV  40 mg Intravenous Q24H  pravastatin  80 mg Oral Daily   senna  1 tablet Oral BID   Continuous Infusions:  sodium chloride 75 mL/hr at 08/08/23 1148   piperacillin-tazobactam 12.5 mL/hr at 08/08/23 0526   potassium chloride 10 mEq (08/08/23 1154)    Nutritional status     Body mass index is 34.46 kg/m.  Data Reviewed:   CBC: Recent Labs  Lab 08/06/23 0108 08/06/23 1516 08/07/23 0608  WBC 20.2* 20.3* 15.9*  HGB 15.5 14.7 13.9  HCT 44.8 43.1 40.4  MCV 94.3 96.6 99.0  PLT 291 220 204   Basic Metabolic Panel: Recent Labs  Lab 08/06/23 0108 08/06/23 1516 08/07/23 0608 08/07/23 0900  NA 131*  --  132*  --    K 3.6  --  2.9*  --   CL 94*  --  97*  --   CO2 21*  --  23  --   GLUCOSE 134*  --  134*  --   BUN 6*  --  9  --   CREATININE 0.90 1.04 1.01  --   CALCIUM 8.8*  --  8.0*  --   MG  --   --   --  2.0   GFR: Estimated Creatinine Clearance: 81 mL/min (by C-G formula based on SCr of 1.01 mg/dL). Liver Function Tests: Recent Labs  Lab 08/06/23 0346 08/07/23 0608  AST 38 27  ALT 34 24  ALKPHOS 102 83  BILITOT 0.8 0.5  PROT 7.3 6.0*  ALBUMIN 3.6 2.6*   Recent Labs  Lab 08/06/23 0108  LIPASE 25   No results for input(s): "AMMONIA" in the last 168 hours. Coagulation Profile: No results for input(s): "INR", "PROTIME" in the last 168 hours. Cardiac Enzymes: No results for input(s): "CKTOTAL", "CKMB", "CKMBINDEX", "TROPONINI" in the last 168 hours. BNP (last 3 results) No results for input(s): "PROBNP" in the last 8760 hours. HbA1C: Recent Labs    08/06/23 0108  HGBA1C 5.2   CBG: No results for input(s): "GLUCAP" in the last 168 hours. Lipid Profile: No results for input(s): "CHOL", "HDL", "LDLCALC", "TRIG", "CHOLHDL", "LDLDIRECT" in the last 72 hours. Thyroid Function Tests: No results for input(s): "TSH", "T4TOTAL", "FREET4", "T3FREE", "THYROIDAB" in the last 72 hours. Anemia Panel: No results for input(s): "VITAMINB12", "FOLATE", "FERRITIN", "TIBC", "IRON", "RETICCTPCT" in the last 72 hours. Sepsis Labs: Recent Labs  Lab 08/07/23 0900  PROCALCITON 1.60    Recent Results (from the past 240 hour(s))  Surgical PCR screen     Status: None   Collection Time: 08/06/23  2:04 PM   Specimen: Nasal Mucosa; Nasal Swab  Result Value Ref Range Status   MRSA, PCR NEGATIVE NEGATIVE Final   Staphylococcus aureus NEGATIVE NEGATIVE Final    Comment: (NOTE) The Xpert SA Assay (FDA approved for NASAL specimens in patients 37 years of age and older), is one component of a comprehensive surveillance program. It is not intended to diagnose infection nor to guide or monitor  treatment. Performed at [facility], [address] [place], [place] [address]          Radiology Studies: NM Hepatobiliary Liver Func  Result Date: 08/08/2023 CLINICAL DATA:  Abdominal pain. EXAM: NUCLEAR MEDICINE HEPATOBILIARY IMAGING TECHNIQUE: Sequential images of the abdomen were obtained out to 60 minutes following intravenous administration of radiopharmaceutical. Additional imaging was performed for another hour after the administration of IV morphine, 2 mg x 1. RADIOPHARMACEUTICALS:  5.0 mCi Tc-78m  Choletec IV COMPARISON:  Ultrasound and CT 08/06/2023 FINDINGS: Prompt  uptake and biliary excretion of activity by the liver is seen. Bowel activity was identified within the first 10 minutes. However no gallbladder activity is identified initially nor with the administration of IV morphine. IMPRESSION: Nonvisualization of the gallbladder despite the administration of morphine IV. Findings consistent with a cystic duct obstruction and possible acute cholecystitis. Electronically Signed   By: Karen Kays M.D.   On: 08/08/2023 11:43           LOS: 2 days   Time spent= 35 mins    Miguel Rota, MD [facility]  If 7PM-7AM, please contact night-coverage  08/08/2023, 12:08 PM

## 2023-08-08 NOTE — Hospital Course (Addendum)
Brief Narrative:  67 y/o followed for COPD, HTN, HLD, OA had the onset of abdominal pain Friday, 08/04/23 that was diffuse and persistent. He denies any nausea or vomiting, change in bowel habit except not having a BM since early Friday when the stool was the usual dark color due to vitamins he takes.  Upon admission CT scan showed concerns of possible acute cholecystitis/duodenitis.    Assessment & Plan:   Principal Problem:   Acute cholecystitis Active Problems:   Essential hypertension   COPD (chronic obstructive pulmonary disease) with chronic bronchitis (HCC)   Dyslipidemia, goal LDL below 130   Easy bruising   Abdominal pain/acute duodenitis Acute cholecystitis -Distended gallbladder abdomen and mild duodenitis seen on the CT chest abdomen pelvis.  Right upper quadrant ultrasound is negative.  LFTs and lipase are negative.  HIDA scan shows concern for acute cholecystitis.  Will consult [facility] to see if patient would benefit from MRCP/ERCP versus proceeding with lap cholecystitis with IOP -Currently on empiric Zosyn   Hypokalemia -Replete.  Check phosphorus and magnesium   Hyperlipidemia -Continue pravastatin.   COPD -As needed bronchodilators   Essential hypertension -Continue home meds.  IV as needed   DVT prophylaxis: heparin injection 5,000 Units Start: 08/06/23 1400   Code Status: Full Code  Family Communication: Wife present at bedside.     Status is: Inpatient Remains inpatient appropriate because: Ongoing evaluation for abdominal pain

## 2023-08-08 NOTE — Consult Note (Signed)
[facility] July 02, 1956  [id number].    Requesting MD: Dr. Stephania Fragmin Chief Complaint/Reason for Consult: cholecystitis  HPI:  This is a 67yo male with a history of HTN, HLD, CODP (but not treatemtn), alcohol abuse (6-12 beers/day, closer to 12 and occasional liquor when out to eat), early cirrhosis, who presented to the ED on Saturday after he began having abdominal pain on Friday.  He ate spaghetti with italian sausage on Thursday night and developed epigastric abdominal pain. He denies any N/V/D/Fevers/chest pain, etc. He has never had anything like this before.  His symptoms continued to worsen and he presented to the ED on Saturday.  His WBC was 20K and his CT questioned some stranding around his duodenum and gallbladder with duodenitis or cholecystitis not able to be ruled out.  He then underwent and [place] that showed a normal gallbladder with no wall thickening, pericholecystic fluid, gallstones, etc; however, he has findings of early cirrhosis, but the portal vein is normal.  He was admitted for further monitoring.  He was started on Zosyn.  He ultimately underwent a HIDA scan which revealed nonvisualization of the gallbladder.  We have been asked to see him for cholecystectomy.  ROS: ROS: see HPI  Family History  Problem Relation Age of Onset   Hypertension Father    Heart disease Father    Colon polyps Father    Hypertension Brother    Colon polyps Brother    Hypertension Sister    Lung cancer Sister    Alcohol abuse Other    Arthritis Other    Diabetes Other    Hyperlipidemia Other    Hypertension Other    Early death Neg Hx    Stroke Neg Hx    Colon cancer Neg Hx    Esophageal cancer Neg Hx    Stomach cancer Neg Hx    Rectal cancer Neg Hx     Past Medical History:  Diagnosis Date   Alcohol abuse    Arthritis    Colon polyps 05/03/2008   diminutive (hyperplastis)   COPD (chronic obstructive pulmonary disease) (HCC)    Elevated LFTs    GERD (gastroesophageal  reflux disease)    Hyperlipidemia    Hypertension     Past Surgical History:  Procedure Laterality Date   CARPAL TUNNEL RELEASE     bilateral    COLONOSCOPY  05/03/2008   [facility]   right ear surgery      due to infection    TOTAL HIP ARTHROPLASTY Left 04/03/2015   Procedure: LEFT TOTAL HIP ARTHROPLASTY ANTERIOR APPROACH;  Surgeon: Samson Frederic, MD;  Location: [facility];  Service: Orthopedics;  Laterality: Left;   VASECTOMY      Social History:  reports that he quit smoking about 20 years ago. His smoking use included cigarettes. He started smoking about 50 years ago. He has been exposed to tobacco smoke. He has never used smokeless tobacco. He reports current alcohol use of about 42.0 standard drinks of alcohol per week. He reports that he does not use drugs.  Allergies:  Allergies  Allergen Reactions   Lisinopril Swelling   Lyrica [Pregabalin] Other (See Comments)    Blurred vision   Olmesartan Other (See Comments)    dizziness   Atorvastatin     REACTION: muscle aches    Medications Prior to Admission  Medication Sig Dispense Refill   amLODipine (NORVASC) 5 MG tablet TAKE 1 TABLET (5 MG TOTAL) BY MOUTH DAILY. 90 tablet  0   celecoxib (CELEBREX) 200 MG capsule TAKE 1 CAPSULE BY MOUTH EVERY DAY (NEED PRIMARY INS) 90 capsule 0   gabapentin (NEURONTIN) 100 MG capsule TAKE 1 CAPSULE BY MOUTH THREE TIMES A DAY 270 capsule 1   levocetirizine (XYZAL) 5 MG tablet Take 1 tablet (5 mg total) by mouth every evening. 90 tablet 1   Multiple Vitamins-Minerals (CENTRUM SILVER PO) Take 1 tablet by mouth daily.     Multiple Vitamins-Minerals (EYE VITAMINS PO) Take 2 capsules by mouth daily. Eye[employer] brand vitamins     omeprazole (PRILOSEC) 20 MG capsule Take 20 mg by mouth daily.     pravastatin (PRAVACHOL) 80 MG tablet TAKE 1 TABLET BY MOUTH EVERY DAY 90 tablet 0     Physical Exam: Blood pressure 133/75, pulse 91, temperature 98 F (36.7 C), temperature source Oral, resp. rate 17, height  5\' 7"  (1.702 m), weight 99.8 kg, SpO2 98%. General: pleasant, WD, WN white male who is laying in bed in NAD HEENT: head is normocephalic, atraumatic.  Sclera are noninjected.  PERRL.  Ears and nose without any masses or lesions.  Mouth is pink and moist Heart: regular, rate, and rhythm.  Normal s1,s2. No obvious murmurs, gallops, or rubs noted.  Palpable radial and pedal pulses bilaterally Lungs: CTAB, no wheezes, rhonchi, or rales noted.  Respiratory effort nonlabored Abd: soft, tender in RUQ with + Murphy's sign, ND, +BS, no masses or organomegaly.  Reducible small umbilical hernia. MS: all 4 extremities are symmetrical with no cyanosis, clubbing, or edema. Psych: A&Ox3 with an appropriate affect.   Results for orders placed or performed during the hospital encounter of 08/06/23 (from the past 48 hour(s))  Surgical PCR screen     Status: None   Collection Time: 08/06/23  2:04 PM   Specimen: Nasal Mucosa; Nasal Swab  Result Value Ref Range   MRSA, PCR NEGATIVE NEGATIVE   Staphylococcus aureus NEGATIVE NEGATIVE    Comment: (NOTE) The Xpert SA Assay (FDA approved for NASAL specimens in patients 84 years of age and older), is one component of a comprehensive surveillance program. It is not intended to diagnose infection nor to guide or monitor treatment. Performed at [facility], [address], [place], [place] [address]   CBC     Status: Abnormal   Collection Time: 08/06/23  3:16 PM  Result Value Ref Range   WBC 20.3 (H) 4.0 - 10.5 K/uL   RBC 4.46 4.22 - 5.81 MIL/uL   Hemoglobin 14.7 13.0 - 17.0 g/dL   HCT 60.4 54.0 - 98.1 %   MCV 96.6 80.0 - 100.0 fL   MCH 33.0 26.0 - 34.0 pg   MCHC 34.1 30.0 - 36.0 g/dL   RDW 19.1 47.8 - 29.5 %   Platelets 220 150 - 400 K/uL   nRBC 0.0 0.0 - 0.2 %    Comment: Performed at [facility], [address] [place], [place] [address]  Creatinine, serum     Status: None   Collection Time: 08/06/23  3:16 PM  Result Value Ref Range    Creatinine, Ser 1.04 0.61 - 1.24 mg/dL   GFR, Estimated >86 >57 mL/min    Comment: (NOTE) Calculated using the CKD-EPI Creatinine Equation (2021) Performed at [facility], [address], [place], [place] [address]   HIV Antibody (routine testing w rflx)     Status: None   Collection Time: 08/07/23  6:08 AM  Result Value Ref Range   HIV  Screen 4th Generation wRfx Non Reactive Non Reactive    Comment: Performed at [facility], [address], [place], [place] [address]  Comprehensive metabolic panel     Status: Abnormal   Collection Time: 08/07/23  6:08 AM  Result Value Ref Range   Sodium 132 (L) 135 - 145 mmol/L   Potassium 2.9 (L) 3.5 - 5.1 mmol/L   Chloride 97 (L) 98 - 111 mmol/L   CO2 23 22 - 32 mmol/L   Glucose, Bld 134 (H) 70 - 99 mg/dL    Comment: Glucose reference range applies only to samples taken after fasting for at least 8 hours.   BUN 9 8 - 23 mg/dL   Creatinine, Ser 6.57 0.61 - 1.24 mg/dL   Calcium 8.0 (L) 8.9 - 10.3 mg/dL   Total Protein 6.0 (L) 6.5 - 8.1 g/dL   Albumin 2.6 (L) 3.5 - 5.0 g/dL   AST 27 15 - 41 U/L   ALT 24 0 - 44 U/L   Alkaline Phosphatase 83 38 - 126 U/L   Total Bilirubin 0.5 0.3 - 1.2 mg/dL   GFR, Estimated >84 >69 mL/min    Comment: (NOTE) Calculated using the CKD-EPI Creatinine Equation (2021)    Anion gap 12 5 - 15    Comment: Performed at [facility], [address] [place], [place] [address]  CBC     Status: Abnormal   Collection Time: 08/07/23  6:08 AM  Result Value Ref Range   WBC 15.9 (H) 4.0 - 10.5 K/uL   RBC 4.08 (L) 4.22 - 5.81 MIL/uL   Hemoglobin 13.9 13.0 - 17.0 g/dL   HCT 84.1 32.4 - 40.1 %   MCV 99.0 80.0 - 100.0 fL   MCH 34.1 (H) 26.0 - 34.0 pg   MCHC 34.4 30.0 - 36.0 g/dL   RDW 02.7 25.3 - 66.4 %   Platelets 204 150 - 400 K/uL   nRBC 0.0 0.0 - 0.2 %    Comment: Performed at [facility], [address], [place], [place] [address]  Magnesium     Status: None   Collection Time: 08/07/23   9:00 AM  Result Value Ref Range   Magnesium 2.0 1.7 - 2.4 mg/dL    Comment: Performed at [facility], [address], [place], [place] [address]  Procalcitonin     Status: None   Collection Time: 08/07/23  9:00 AM  Result Value Ref Range   Procalcitonin 1.60 ng/mL    Comment:        Interpretation: PCT > 0.5 ng/mL and <= 2 ng/mL: Systemic infection (sepsis) is possible, but other conditions are known to elevate PCT as well. (NOTE)       Sepsis PCT Algorithm           Lower Respiratory Tract                                      Infection PCT Algorithm    ----------------------------     ----------------------------         PCT < 0.25 ng/mL                PCT < 0.10 ng/mL          Strongly encourage             Strongly discourage   discontinuation of antibiotics  initiation of antibiotics    ----------------------------     -----------------------------       PCT 0.25 - 0.50 ng/mL            PCT 0.10 - 0.25 ng/mL               OR       >80% decrease in PCT            Discourage initiation of                                            antibiotics      Encourage discontinuation           of antibiotics    ----------------------------     -----------------------------         PCT >= 0.50 ng/mL              PCT 0.26 - 0.50 ng/mL                AND       <80% decrease in PCT             Encourage initiation of                                             antibiotics       Encourage continuation           of antibiotics    ----------------------------     -----------------------------        PCT >= 0.50 ng/mL                  PCT > 0.50 ng/mL               AND         increase in PCT                  Strongly encourage                                      initiation of antibiotics    Strongly encourage escalation           of antibiotics                                     -----------------------------                                           PCT <= 0.25 ng/mL                                                  OR                                        >  80% decrease in PCT                                      Discontinue / Do not initiate                                             antibiotics  Performed at [facility], [address], [place], [place] [address]    NM Hepatobiliary Liver Func  Result Date: 08/08/2023 CLINICAL DATA:  Abdominal pain. EXAM: NUCLEAR MEDICINE HEPATOBILIARY IMAGING TECHNIQUE: Sequential images of the abdomen were obtained out to 60 minutes following intravenous administration of radiopharmaceutical. Additional imaging was performed for another hour after the administration of IV morphine, 2 mg x 1. RADIOPHARMACEUTICALS:  5.0 mCi Tc-71m  Choletec IV COMPARISON:  Ultrasound and CT 08/06/2023 FINDINGS: Prompt uptake and biliary excretion of activity by the liver is seen. Bowel activity was identified within the first 10 minutes. However no gallbladder activity is identified initially nor with the administration of IV morphine. IMPRESSION: Nonvisualization of the gallbladder despite the administration of morphine IV. Findings consistent with a cystic duct obstruction and possible acute cholecystitis. Electronically Signed   By: Karen Kays M.D.   On: 08/08/2023 11:43      Assessment/Plan Acute acalculous cholecystitis The patient has been seen, examined, labs, chart, vitals, and imaging personally reviewed.  Despite a somewhat equivocal CT scan for cholecystitis and a normal [place], his HIDA is positive for cystic duct obstruction.  His story is consistent with biliary disease and he hurts where his gallbladder is located.  We had a lengthy discussion regarding his ETOH consumption and his ETOH intake.  We discussed that in order to prevent worsening of his cirrhosis he needs to stop drinking.  We discussed "side effects" of cirrhosis including bleeding risk, encephalopathy, ascites, liver failure, etc.  We discussed that it is his choice  whether he is interested in limiting this progression or not understanding that full cessation of something you have done for so long is difficult.  We discussed how this can affect intra-abdominal surgery, but that he appears well compensated at this time and should be safe to undergo a lap chole for his gallbladder at this time.  I have explained the procedure, risks, and aftercare of cholecystectomy.  Risks include but are not limited to bleeding, infection, wound problems, anesthesia, diarrhea, bile leak, injury to common bile duct/liver/intestine.  He seems to understand and agrees to proceed.  His wife is at bedside who all agreed with moving forward.  We will plan for surgery today as he is NPO and schedule allows.   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  Letha Cape, [facility] Surgery 08/08/2023, 1:09 PM Please see Amion for pager number during day hours 7:00am-4:30pm or 7:00am -11:30am on weekends

## 2023-08-08 NOTE — Plan of Care (Signed)
  Problem: Health Behavior/Discharge Planning: Goal: Ability to manage health-related needs will improve Outcome: Progressing   Problem: Clinical Measurements: Goal: Ability to maintain clinical measurements within normal limits will improve Outcome: Progressing   Problem: Clinical Measurements: Goal: Will remain free from infection Outcome: Progressing   Problem: Clinical Measurements: Goal: Diagnostic test results will improve Outcome: Progressing   Problem: Nutrition: Goal: Adequate nutrition will be maintained Outcome: Progressing   Problem: Coping: Goal: Level of anxiety will decrease Outcome: Progressing   Problem: Pain Managment: Goal: General experience of comfort will improve Outcome: Progressing

## 2023-08-09 ENCOUNTER — Encounter (HOSPITAL_COMMUNITY): Payer: Self-pay | Admitting: Surgery

## 2023-08-09 DIAGNOSIS — K810 Acute cholecystitis: Secondary | ICD-10-CM | POA: Diagnosis not present

## 2023-08-09 LAB — COMPREHENSIVE METABOLIC PANEL
ALT: 29 U/L (ref 0–44)
AST: 34 U/L (ref 15–41)
Albumin: 2.3 g/dL — ABNORMAL LOW (ref 3.5–5.0)
Alkaline Phosphatase: 112 U/L (ref 38–126)
Anion gap: 8 (ref 5–15)
BUN: 6 mg/dL — ABNORMAL LOW (ref 8–23)
CO2: 23 mmol/L (ref 22–32)
Calcium: 8.3 mg/dL — ABNORMAL LOW (ref 8.9–10.3)
Chloride: 104 mmol/L (ref 98–111)
Creatinine, Ser: 0.72 mg/dL (ref 0.61–1.24)
GFR, Estimated: >60 mL/min (ref >60)
Glucose, Bld: 149 mg/dL — ABNORMAL HIGH (ref 70–99)
Potassium: 4 mmol/L (ref 3.5–5.1)
Sodium: 135 mmol/L (ref 135–145)
Total Bilirubin: 0.6 mg/dL (ref 0.3–1.2)
Total Protein: 6.2 g/dL — ABNORMAL LOW (ref 6.5–8.1)

## 2023-08-09 LAB — CBC
HCT: 38.3 % — ABNORMAL LOW (ref 39.0–52.0)
Hemoglobin: 13 g/dL (ref 13.0–17.0)
MCH: 32.7 pg (ref 26.0–34.0)
MCHC: 33.9 g/dL (ref 30.0–36.0)
MCV: 96.2 fL (ref 80.0–100.0)
Platelets: 234 10*3/uL (ref 150–400)
RBC: 3.98 MIL/uL — ABNORMAL LOW (ref 4.22–5.81)
RDW: 11.9 % (ref 11.5–15.5)
WBC: 12.4 10*3/uL — ABNORMAL HIGH (ref 4.0–10.5)
nRBC: 0 % (ref 0.0–0.2)

## 2023-08-09 LAB — MAGNESIUM: Magnesium: 2 mg/dL (ref 1.7–2.4)

## 2023-08-09 LAB — PHOSPHORUS: Phosphorus: 2.7 mg/dL (ref 2.5–4.6)

## 2023-08-09 MED ORDER — SENNA 8.6 MG PO TABS: 2.0000 | ORAL_TABLET | Freq: Every day | ORAL | 0 refills | Status: DC | PRN

## 2023-08-09 MED ORDER — ACETAMINOPHEN 500 MG PO TABS: 1000.0000 mg | ORAL_TABLET | Freq: Four times a day (QID) | ORAL | Status: AC | PRN

## 2023-08-09 MED ORDER — AMOXICILLIN-POT CLAVULANATE 875-125 MG PO TABS: 1.0000 | ORAL_TABLET | Freq: Two times a day (BID) | ORAL | 0 refills | Status: AC

## 2023-08-09 MED ORDER — TRAMADOL HCL 50 MG PO TABS: 50.0000 mg | ORAL_TABLET | Freq: Four times a day (QID) | ORAL | 0 refills | Status: DC | PRN

## 2023-08-09 NOTE — Anesthesia Postprocedure Evaluation (Signed)
Anesthesia Post Note  Patient: Kenneth Singleton.  Procedure(s) Performed: LAPAROSCOPIC CHOLECYSTECTOMY (Abdomen)     Patient location during evaluation: PACU Anesthesia Type: General Level of consciousness: awake and alert Pain management: pain level controlled Vital Signs Assessment: post-procedure vital signs reviewed and stable Respiratory status: spontaneous breathing, nonlabored ventilation, respiratory function stable and patient connected to nasal cannula oxygen Cardiovascular status: blood pressure returned to baseline and stable Postop Assessment: no apparent nausea or vomiting Anesthetic complications: no   No notable events documented.  Last Vitals:  Vitals:   08/09/23 0818 08/09/23 0900  BP: 128/75 126/69  Pulse: 71 72  Resp: 17 16  Temp: 36.5 C 36.5 C  SpO2: 90% 97%    Last Pain:  Vitals:   08/09/23 0900  TempSrc: Oral  PainSc: 2                  [place]

## 2023-08-09 NOTE — Discharge Instructions (Signed)

## 2023-08-09 NOTE — Care Management Important Message (Signed)
Important Message  Patient Details  Name: Kenneth Singleton [facility] MRN: [id number] Date of Birth: 12-29-1955   Medicare Important Message Given:  Yes     Sherilyn Banker 08/09/2023, 12:39 PM

## 2023-08-09 NOTE — Progress Notes (Signed)
1 Day Post-Op  Subjective: Looks great this morning sitting on edge of bed.  Pain with movement, but preop pain gone.  Tolerating CLD with no issues.  ROS: See above, otherwise other systems negative  Objective: Vital signs in last 24 hours: Temp:  [97.7 F (36.5 C)-98.7 F (37.1 C)] 97.7 F (36.5 C) (09/10 0900) Pulse Rate:  [71-91] 72 (09/10 0900) Resp:  [15-18] 16 (09/10 0900) BP: (107-140)/(57-78) 126/69 (09/10 0900) SpO2:  [90 %-98 %] 97 % (09/10 0900) Weight:  [99.8 kg] 99.8 kg (09/09 1338) Last BM Date : 08/08/23  Intake/Output from previous day: 09/09 0701 - 09/10 0700 In: 1170 [P.O.:120; I.V.:950; IV Piggyback:100] Out: 65 [Drains:40; Blood:25] Intake/Output this shift: Total I/O In: 200 [P.O.:200] Out: -   PE: Abd: soft, appropriately tender, JP with minimal serosang output at this time, incisions c/d/I,   Lab Results:  Recent Labs    08/07/23 0608 08/08/23 1420 08/09/23 0258  WBC 15.9*  --  12.4*  HGB 13.9 15.0 13.0  HCT 40.4 44.0 38.3*  PLT 204  --  234   BMET Recent Labs    08/07/23 0608 08/08/23 1420 08/09/23 0258  NA 132* 137 135  K 2.9* 3.5 4.0  CL 97* 105 104  CO2 23  --  23  GLUCOSE 134* 87 149*  BUN 9 8 6*  CREATININE 1.01 0.70 0.72  CALCIUM 8.0*  --  8.3*   PT/INR No results for input(s): "LABPROT", "INR" in the last 72 hours. CMP     Component Value Date/Time   NA 135 08/09/2023 0258   K 4.0 08/09/2023 0258   CL 104 08/09/2023 0258   CO2 23 08/09/2023 0258   GLUCOSE 149 (H) 08/09/2023 0258   BUN 6 (L) 08/09/2023 0258   CREATININE 0.72 08/09/2023 0258   CREATININE 0.91 07/14/2020 0851   CALCIUM 8.3 (L) 08/09/2023 0258   PROT 6.2 (L) 08/09/2023 0258   ALBUMIN 2.3 (L) 08/09/2023 0258   AST 34 08/09/2023 0258   ALT 29 08/09/2023 0258   ALKPHOS 112 08/09/2023 0258   BILITOT 0.6 08/09/2023 0258   GFRNONAA >60 08/09/2023 0258   GFRNONAA 89 07/14/2020 0851   GFRAA 104 07/14/2020 0851   Lipase     Component Value  Date/Time   LIPASE 25 08/06/2023 0108       Studies/Results: NM Hepatobiliary Liver Func  Result Date: 08/08/2023 CLINICAL DATA:  Abdominal pain. EXAM: NUCLEAR MEDICINE HEPATOBILIARY IMAGING TECHNIQUE: Sequential images of the abdomen were obtained out to 60 minutes following intravenous administration of radiopharmaceutical. Additional imaging was performed for another hour after the administration of IV morphine, 2 mg x 1. RADIOPHARMACEUTICALS:  5.0 mCi Tc-44m  Choletec IV COMPARISON:  Ultrasound and CT 08/06/2023 FINDINGS: Prompt uptake and biliary excretion of activity by the liver is seen. Bowel activity was identified within the first 10 minutes. However no gallbladder activity is identified initially nor with the administration of IV morphine. IMPRESSION: Nonvisualization of the gallbladder despite the administration of morphine IV. Findings consistent with a cystic duct obstruction and possible acute cholecystitis. Electronically Signed   By: Karen Kays M.D.   On: 08/08/2023 11:43    Anti-infectives: Anti-infectives (From admission, onward)    Start     Dose/Rate Route Frequency Ordered Stop   08/09/23 0000  amoxicillin-clavulanate (AUGMENTIN) 875-125 MG tablet        1 tablet Oral 2 times daily 08/09/23 0952 08/12/23 2359   08/06/23 1400  piperacillin-tazobactam (ZOSYN) IVPB 3.375  g        3.375 g 12.5 mL/hr over 240 Minutes Intravenous Every 8 hours 08/06/23 1237     08/06/23 0715  piperacillin-tazobactam (ZOSYN) IVPB 3.375 g        3.375 g 100 mL/hr over 30 Minutes Intravenous  Once 08/06/23 0700 08/06/23 0757        Assessment/Plan POD 1, s/p lap chole by Dr. Freida Busman, 9/9 for acute acalculous cholecystitis -cont JP drain at home -adv to regular diet, if tolerates may DC home today -discussed JP drain care with the patient and his son was present as well.  He is familiar with this as his wife had one after her mastectomy. -he will follow up in 1 week for a JP drain removal  check with Dr. Freida Busman -3 more days of oral abx and pain meds sent to pharmacy. -WBC down to 12K today, LFTs normal -d/w primary service.   FEN - regular VTE - heparin ID - zosyn, augmentin at DC    LOS: 3 days    Letha Cape , [facility] 08/09/2023, 9:53 AM Please see Amion for pager number during day hours 7:00am-4:30pm or 7:00am -11:30am on weekends

## 2023-08-09 NOTE — Plan of Care (Signed)

## 2023-08-09 NOTE — Plan of Care (Signed)
  Problem: Pain Managment: Goal: General experience of comfort will improve Outcome: Progressing   Problem: Safety: Goal: Ability to remain free from injury will improve Outcome: Progressing   

## 2023-08-09 NOTE — Discharge Summary (Signed)
Physician Discharge Summary  Kenneth GRISCOM Sr. SWF:[id number] DOB: December 02, 1955 DOA: 08/06/2023  PCP: [name], MD  Admit date: 08/06/2023 Discharge date: 08/09/2023  Admitted From: Home Disposition: Home  Recommendations for Outpatient Follow-up:  Follow up with PCP in 1-2 weeks Please obtain BMP/CBC in one week your next doctors visit.  Follow-up outpatient general surgery Oral Augmentin, pain medication prescribed   Discharge Condition: Stable CODE STATUS: Full code Diet recommendation: Low-fat  Brief/Interim Summary: Brief Narrative:  67 y/o followed for COPD, HTN, HLD, OA had the onset of abdominal pain Friday, 08/04/23 that was diffuse and persistent. He denies any nausea or vomiting, change in bowel habit except not having a BM since early Friday when the stool was the usual dark color due to vitamins he takes.  Upon admission CT scan showed concerns of possible acute cholecystitis/duodenitis.  HIDA showed concerns for acute cholecystitis and underwent laparoscopic cholecystectomy on 9/9.  Following day tolerating oral without any issues.  Cleared by general surgery for discharge.    Assessment & Plan:   Principal Problem:   Acute cholecystitis Active Problems:   Essential hypertension   COPD (chronic obstructive pulmonary disease) with chronic bronchitis (HCC)   Dyslipidemia, goal LDL below 130   Easy bruising   Abdominal pain/acute duodenitis Acute cholecystitis status laparoscopic cholecystectomy -Distended gallbladder abdomen and mild duodenitis seen on the CT chest abdomen pelvis.  Right upper quadrant ultrasound is negative.  Eventually HIDA showed concerns of acute cholecystitis therefore underwent laparoscopic cholecystectomy on 9/9. -Currently on empiric Zosyn, transition to oral Augmentin.  Stable for discharge.  Surgery to follow-up on right upper quadrant drain as outpatient   Hypokalemia/hypophosphatemia - Repleted   Hyperlipidemia -Continue pravastatin.    COPD -As needed bronchodilators   Essential hypertension -Continue home meds.     DVT prophylaxis: heparin injection 5,000 Units Start: 08/06/23 1400   Code Status: Full Code  Family Communication: Wife present at bedside.   Stable for discharge   Discharge Diagnoses:  Principal Problem:   Acute cholecystitis Active Problems:   Essential hypertension   COPD (chronic obstructive pulmonary disease) with chronic bronchitis (HCC)   Dyslipidemia, goal LDL below 130   Easy bruising      Consultations: General surgery  Subjective:  Feeling well no complaints.  Wishing to go home  Discharge Exam: Vitals:   08/09/23 0818 08/09/23 0900  BP: 128/75 126/69  Pulse: 71 72  Resp: 17 16  Temp: 97.7 F (36.5 C) 97.7 F (36.5 C)  SpO2: 90% 97%   Vitals:   08/09/23 0024 08/09/23 0525 08/09/23 0818 08/09/23 0900  BP: 125/74 139/78 128/75 126/69  Pulse: 80 80 71 72  Resp: 16 18 17 16   Temp: 98.1 F (36.7 C) 97.7 F (36.5 C) 97.7 F (36.5 C) 97.7 F (36.5 C)  TempSrc: Oral Oral Oral Oral  SpO2: 91% 90% 90% 97%  Weight:      Height:        General: Pt is alert, awake, not in acute distress Cardiovascular: RRR, S1/S2 +, no rubs, no gallops Respiratory: CTA bilaterally, no wheezing, no rhonchi Abdominal: Soft, NT, ND, bowel sounds + Extremities: no edema, no cyanosis Right upper quadrant drain in place Discharge Instructions   Allergies as of 08/09/2023       Reactions   Lisinopril Swelling   Lyrica [pregabalin] Other (See Comments)   Blurred vision   Olmesartan Other (See Comments)   dizziness   Atorvastatin    REACTION: muscle aches  Medication List     TAKE these medications    acetaminophen 500 MG tablet Commonly known as: TYLENOL Take 2 tablets (1,000 mg total) by mouth every 6 (six) hours as needed.   amLODipine 5 MG tablet Commonly known as: NORVASC TAKE 1 TABLET (5 MG TOTAL) BY MOUTH DAILY.   amoxicillin-clavulanate 875-125 MG  tablet Commonly known as: AUGMENTIN Take 1 tablet by mouth 2 (two) times daily for 3 days.   celecoxib 200 MG capsule Commonly known as: CELEBREX TAKE 1 CAPSULE BY MOUTH EVERY DAY (NEED PRIMARY INS)   CENTRUM SILVER PO Take 1 tablet by mouth daily.   EYE VITAMINS PO Take 2 capsules by mouth daily. [employer] brand vitamins   gabapentin 100 MG capsule Commonly known as: NEURONTIN TAKE 1 CAPSULE BY MOUTH THREE TIMES A DAY   levocetirizine 5 MG tablet Commonly known as: XYZAL Take 1 tablet (5 mg total) by mouth every evening.   omeprazole 20 MG capsule Commonly known as: PRILOSEC Take 20 mg by mouth daily.   pravastatin 80 MG tablet Commonly known as: PRAVACHOL TAKE 1 TABLET BY MOUTH EVERY DAY   senna 8.6 MG Tabs tablet Commonly known as: SENOKOT Take 2 tablets (17.2 mg total) by mouth daily as needed for mild constipation or moderate constipation.   traMADol 50 MG tablet Commonly known as: ULTRAM Take 1 tablet (50 mg total) by mouth every 6 (six) hours as needed for moderate pain or severe pain.        Follow-up Information     Fritzi Mandes, MD Follow up in 1 week(s).   Specialty: General Surgery Why: Office will call you with a follow up appointment, If you don't hear from the office, please call, Arrive 30 minutes prior to your appointment time, Please bring your insurance card and photo ID Contact information: [address] [place] [address] [phone]         [name], MD Follow up in 1 week(s).   Specialty: Internal Medicine Contact information: [address] [place] [address] [phone]                Allergies  Allergen Reactions   Lisinopril Swelling   Lyrica [Pregabalin] Other (See Comments)    Blurred vision   Olmesartan Other (See Comments)    dizziness   Atorvastatin     REACTION: muscle aches    You were cared for by a hospitalist during your hospital stay. If you have any questions about  your discharge medications or the care you received while you were in the hospital after you are discharged, you can call the unit and asked to speak with the hospitalist on call if the hospitalist that took care of you is not available. Once you are discharged, your primary care physician will handle any further medical issues. Please note that no refills for any discharge medications will be authorized once you are discharged, as it is imperative that you return to your primary care physician (or establish a relationship with a primary care physician if you do not have one) for your aftercare needs so that they can reassess your need for medications and monitor your lab values.  You were cared for by a hospitalist during your hospital stay. If you have any questions about your discharge medications or the care you received while you were in the hospital after you are discharged, you can call the unit and asked to speak with the hospitalist on  call if the hospitalist that took care of you is not available. Once you are discharged, your primary care physician will handle any further medical issues. Please note that NO REFILLS for any discharge medications will be authorized once you are discharged, as it is imperative that you return to your primary care physician (or establish a relationship with a primary care physician if you do not have one) for your aftercare needs so that they can reassess your need for medications and monitor your lab values.  Please request your Prim.MD to go over all Hospital Tests and Procedure/Radiological results at the follow up, please get all Hospital records sent to your Prim MD by signing hospital release before you go home.  Get CBC, CMP, 2 view Chest X ray checked  by Primary MD during your next visit or SNF MD in 5-7 days ( we routinely change or add medications that can affect your baseline labs and fluid status, therefore we recommend that you get the mentioned basic  workup next visit with your PCP, your PCP may decide not to get them or add new tests based on their clinical decision)  On your next visit with your primary care physician please Get Medicines reviewed and adjusted.  If you experience worsening of your admission symptoms, develop shortness of breath, life threatening emergency, suicidal or homicidal thoughts you must seek medical attention immediately by calling 911 or calling your MD immediately  if symptoms less severe.  You Must read complete instructions/literature along with all the possible adverse reactions/side effects for all the Medicines you take and that have been prescribed to you. Take any new Medicines after you have completely understood and accpet all the possible adverse reactions/side effects.   Do not drive, operate heavy machinery, perform activities at heights, swimming or participation in water activities or provide baby sitting services if your were admitted for syncope or siezures until you have seen by Primary MD or a Neurologist and advised to do so again.  Do not drive when taking Pain medications.   Procedures/Studies: NM Hepatobiliary Liver Func  Result Date: 08/08/2023 CLINICAL DATA:  Abdominal pain. EXAM: NUCLEAR MEDICINE HEPATOBILIARY IMAGING TECHNIQUE: Sequential images of the abdomen were obtained out to 60 minutes following intravenous administration of radiopharmaceutical. Additional imaging was performed for another hour after the administration of IV morphine, 2 mg x 1. RADIOPHARMACEUTICALS:  5.0 mCi Tc-33m  Choletec IV COMPARISON:  Ultrasound and CT 08/06/2023 FINDINGS: Prompt uptake and biliary excretion of activity by the liver is seen. Bowel activity was identified within the first 10 minutes. However no gallbladder activity is identified initially nor with the administration of IV morphine. IMPRESSION: Nonvisualization of the gallbladder despite the administration of morphine IV. Findings consistent with a  cystic duct obstruction and possible acute cholecystitis. Electronically Signed   By: Karen Kays M.D.   On: 08/08/2023 11:43   US Abdomen Limited RUQ (LIVER/GB)  Result Date: 08/06/2023 CLINICAL DATA:  67 year old male with history of epigastric pain. EXAM: ULTRASOUND ABDOMEN LIMITED RIGHT UPPER QUADRANT COMPARISON:  Right upper quadrant abdominal ultrasound 07/13/2019. FINDINGS: Gallbladder: No gallstones or wall thickening visualized. No sonographic Murphy sign noted by sonographer. Common bile duct: Diameter: 5.6 mm Liver: No focal lesion identified. Liver is diffusely heterogeneously echogenic, suggesting a background of cirrhosis. There is also a slight irregular contour of the liver, suggesting underlying cirrhosis. Portal vein is patent on color Doppler imaging with normal direction of blood flow towards the liver. Other: None. IMPRESSION: 1. No acute  findings. Specifically, no gallstones and no evidence of acute cholecystitis. 2. Hepatic steatosis. 3. Nodular contour of the liver suggesting early changes of cirrhosis. Electronically Signed   By: Trudie Reed M.D.   On: 08/06/2023 07:59   CT Angio Chest/Abd/Pel for Dissection W and/or Wo Contrast  Result Date: 08/06/2023 CLINICAL DATA:  67 year old male with history of substernal chest pain radiating into the mid back and down to the umbilical region for 1 day. Clinical suspicion for acute aortic syndrome. EXAM: CT ANGIOGRAPHY CHEST, ABDOMEN AND PELVIS TECHNIQUE: Non-contrast CT of the chest was initially obtained. Multidetector CT imaging through the chest, abdomen and pelvis was performed using the standard protocol during bolus administration of intravenous contrast. Multiplanar reconstructed images and MIPs were obtained and reviewed to evaluate the vascular anatomy. RADIATION DOSE REDUCTION: This exam was performed according to the departmental dose-optimization program which includes automated exposure control, adjustment of the mA and/or kV  according to patient size and/or use of iterative reconstruction technique. CONTRAST:  OMNIPAQUE IOHEXOL 350 MG/ML SOLN COMPARISON:  No priors. FINDINGS: CTA CHEST FINDINGS Cardiovascular: Atherosclerotic calcifications are noted throughout the thoracic aorta. No definite coronary artery calcifications are noted. Precontrast images demonstrate no crescentic high attenuation associated with the wall of the thoracic aorta to indicate the presence of acute intramural hemorrhage. Postcontrast images demonstrate no aneurysm or dissection of the thoracic aorta. Ascending thoracic aorta, mid arch and descending thoracic aorta are normal in caliber measuring 3.0 cm, 2.5 cm and 2.5 cm in diameter respectively. Heart size is normal. There is no significant pericardial fluid, thickening or pericardial calcification. Mediastinum/Nodes: No pathologically enlarged mediastinal or hilar lymph nodes. Esophagus is unremarkable in appearance. No axillary lymphadenopathy. Lungs/Pleura: No acute consolidative airspace disease. No pleural effusions. No definite suspicious appearing pulmonary nodules or masses are noted. Mild diffuse bronchial wall thickening with mild centrilobular and paraseptal emphysema. Musculoskeletal: There are no aggressive appearing lytic or blastic lesions noted in the visualized portions of the skeleton. Review of the MIP images confirms the above findings. CTA ABDOMEN AND PELVIS FINDINGS VASCULAR Aorta: Aortic atherosclerosis. Normal caliber aorta without aneurysm, dissection, vasculitis or significant stenosis. Celiac: Patent without evidence of aneurysm, dissection, vasculitis or significant stenosis. SMA: Patent without evidence of aneurysm, dissection, vasculitis or significant stenosis. Renals: Both renal arteries are patent without evidence of aneurysm, dissection, vasculitis, fibromuscular dysplasia or significant stenosis. IMA: Patent without evidence of aneurysm, dissection, vasculitis or  significant stenosis. Inflow: Patent without evidence of aneurysm, dissection, vasculitis or significant stenosis. Veins: No obvious venous abnormality within the limitations of this arterial phase study. Review of the MIP images confirms the above findings. NON-VASCULAR Hepatobiliary: No definite suspicious cystic or solid hepatic lesions are noted on today's arterial phase examination. There is diffuse low attenuation throughout the hepatic parenchyma, indicative of a background of hepatic steatosis. No intra or extrahepatic biliary ductal dilatation. Gallbladder is moderately distended. There is a suggestion of some stones and/or sludge lying dependently in the gallbladder. Trace amount of soft tissue stranding surrounding the gallbladder. Pancreas: No pancreatic mass. No pancreatic ductal dilatation. No pancreatic or peripancreatic fluid collections or inflammatory changes. Spleen: Unremarkable. Adrenals/Urinary Tract: Bilateral kidneys and bilateral adrenal glands are normal in appearance. No hydroureteronephrosis. Urinary bladder is moderately distended, but otherwise unremarkable in appearance. Stomach/Bowel: The appearance of the stomach is normal. Subtle soft tissue stranding is noted adjacent to the second portion of the duodenum. No pathologic dilatation of small bowel or colon. Normal appendix. Lymphatic: No lymphadenopathy noted in the abdomen  or pelvis. Reproductive: Prostate gland and seminal vesicles are unremarkable in appearance. Other: No significant volume of ascites. No pneumoperitoneum. Small umbilical hernia containing only omental fat. Musculoskeletal: There are no aggressive appearing lytic or blastic lesions noted in the visualized portions of the skeleton. Review of the MIP images confirms the above findings. IMPRESSION: 1. While there is aortic atherosclerosis, there are no findings to suggest acute aortic syndrome. 2. Gallbladder is moderately distended with a suggestion of some  dependent stones and/or biliary sludge, as well as a trace amount of surrounding soft tissue stranding. These findings could indicate an acute cholecystitis. Further clinical evaluation is recommended. Consideration for follow-up right upper quadrant abdominal ultrasound is recommended if clinically appropriate. 3. Subtle inflammatory changes also noted adjacent to the second portion of the duodenal. If there is no evidence of acute cholecystitis, clinical correlation for signs and symptoms of potential duodenitis is suggested. 4. Hepatic steatosis. 5. Tiny umbilical hernia containing only omental fat. No associated bowel incarceration or obstruction at this time. 6. Mild diffuse bronchial wall thickening with mild centrilobular and paraseptal emphysema; imaging findings suggestive of underlying COPD. Electronically Signed   By: Trudie Reed M.D.   On: 08/06/2023 05:38   DG Chest 2 View  Result Date: 08/06/2023 CLINICAL DATA:  Chest pain. EXAM: CHEST - 2 VIEW COMPARISON:  Chest x-ray 03/12/2013. FINDINGS: The heart size and mediastinal contours are within normal limits. Both lungs are clear. The visualized skeletal structures are unremarkable. IMPRESSION: No active cardiopulmonary disease. Electronically Signed   By: Darliss Cheney M.D.   On: 08/06/2023 01:29     The results of significant diagnostics from this hospitalization (including imaging, microbiology, ancillary and laboratory) are listed below for reference.     Microbiology: Recent Results (from the past 240 hour(s))  Surgical PCR screen     Status: None   Collection Time: 08/06/23  2:04 PM   Specimen: Nasal Mucosa; Nasal Swab  Result Value Ref Range Status   MRSA, PCR NEGATIVE NEGATIVE Final   Staphylococcus aureus NEGATIVE NEGATIVE Final    Comment: (NOTE) The Xpert SA Assay (FDA approved for NASAL specimens in patients 66 years of age and older), is one component of a comprehensive surveillance program. It is not intended to  diagnose infection nor to guide or monitor treatment. Performed at [facility], [address], [place], [place] [address]      Labs: BNP (last 3 results) No results for input(s): "BNP" in the last 8760 hours. Basic Metabolic Panel: Recent Labs  Lab 08/06/23 0108 08/06/23 1516 08/07/23 0608 08/07/23 0900 08/08/23 1413 08/08/23 1420 08/09/23 0258  NA 131*  --  132*  --   --  137 135  K 3.6  --  2.9*  --   --  3.5 4.0  CL 94*  --  97*  --   --  105 104  CO2 21*  --  23  --   --   --  23  GLUCOSE 134*  --  134*  --   --  87 149*  BUN 6*  --  9  --   --  8 6*  CREATININE 0.90 1.04 1.01  --   --  0.70 0.72  CALCIUM 8.8*  --  8.0*  --   --   --  8.3*  MG  --   --   --  2.0 2.2  --  2.0  PHOS  --   --   --   --  2.2*  --  2.7   Liver Function Tests: Recent Labs  Lab 08/06/23 0346 08/07/23 0608 08/09/23 0258  AST 38 27 34  ALT 34 24 29  ALKPHOS 102 83 112  BILITOT 0.8 0.5 0.6  PROT 7.3 6.0* 6.2*  ALBUMIN 3.6 2.6* 2.3*   Recent Labs  Lab 08/06/23 0108  LIPASE 25   No results for input(s): "AMMONIA" in the last 168 hours. CBC: Recent Labs  Lab 08/06/23 0108 08/06/23 1516 08/07/23 0608 08/08/23 1420 08/09/23 0258  WBC 20.2* 20.3* 15.9*  --  12.4*  HGB 15.5 14.7 13.9 15.0 13.0  HCT 44.8 43.1 40.4 44.0 38.3*  MCV 94.3 96.6 99.0  --  96.2  PLT 291 220 204  --  234   Cardiac Enzymes: No results for input(s): "CKTOTAL", "CKMB", "CKMBINDEX", "TROPONINI" in the last 168 hours. BNP: Invalid input(s): "POCBNP" CBG: No results for input(s): "GLUCAP" in the last 168 hours. D-Dimer No results for input(s): "DDIMER" in the last 72 hours. Hgb A1c No results for input(s): "HGBA1C" in the last 72 hours. Lipid Profile No results for input(s): "CHOL", "HDL", "LDLCALC", "TRIG", "CHOLHDL", "LDLDIRECT" in the last 72 hours. Thyroid function studies No results for input(s): "TSH", "T4TOTAL", "T3FREE", "THYROIDAB" in the last 72 hours.  Invalid input(s):  "FREET3" Anemia work up No results for input(s): "VITAMINB12", "FOLATE", "FERRITIN", "TIBC", "IRON", "RETICCTPCT" in the last 72 hours. Urinalysis    Component Value Date/Time   COLORURINE YELLOW 08/06/2023 0110   APPEARANCEUR CLEAR 08/06/2023 0110   LABSPEC 1.011 08/06/2023 0110   PHURINE 6.0 08/06/2023 0110   GLUCOSEU NEGATIVE 08/06/2023 0110   GLUCOSEU NEGATIVE 09/07/2021 1542   HGBUR NEGATIVE 08/06/2023 0110   BILIRUBINUR NEGATIVE 08/06/2023 0110   KETONESUR 5 (A) 08/06/2023 0110   PROTEINUR NEGATIVE 08/06/2023 0110   UROBILINOGEN 0.2 09/07/2021 1542   NITRITE NEGATIVE 08/06/2023 0110   LEUKOCYTESUR NEGATIVE 08/06/2023 0110   Sepsis Labs Recent Labs  Lab 08/06/23 0108 08/06/23 1516 08/07/23 0608 08/09/23 0258  WBC 20.2* 20.3* 15.9* 12.4*   Microbiology Recent Results (from the past 240 hour(s))  Surgical PCR screen     Status: None   Collection Time: 08/06/23  2:04 PM   Specimen: Nasal Mucosa; Nasal Swab  Result Value Ref Range Status   MRSA, PCR NEGATIVE NEGATIVE Final   Staphylococcus aureus NEGATIVE NEGATIVE Final    Comment: (NOTE) The Xpert SA Assay (FDA approved for NASAL specimens in patients 67 years of age and older), is one component of a comprehensive surveillance program. It is not intended to diagnose infection nor to guide or monitor treatment. Performed at [facility], [address] [place], [place] [address]      Time coordinating discharge:  I have spent 35 minutes face to face with the patient and on the ward discussing the patients care, assessment, plan and disposition with other care givers. >50% of the time was devoted counseling the patient about the risks and benefits of treatment/Discharge disposition and coordinating care.   SIGNED:   Miguel Rota, MD  [facility] 08/09/2023, 1:10 PM   If 7PM-7AM, please contact night-coverage

## 2023-08-10 ENCOUNTER — Telehealth: Payer: Self-pay

## 2023-08-10 LAB — SURGICAL PATHOLOGY

## 2023-08-10 NOTE — Transitions of Care (Post Inpatient/ED Visit) (Signed)
08/10/2023  Name: Kenneth Hensler [facility] Sr. MRN: [id number] DOB: 1956/03/30  Today's TOC FU Call Status: Today's TOC FU Call Status:: Successful TOC FU Call Completed TOC FU Call Complete Date: 08/10/23 Patient's Name and Date of Birth confirmed.  Transition Care Management Follow-up Telephone Call Date of Discharge: 08/09/23 Discharge Facility: [facility] Type of Discharge: Inpatient Admission Primary Inpatient Discharge Diagnosis:: epigastric pain How have you been since you were released from the hospital?: Better Any questions or concerns?: No  Items Reviewed: Did you receive and understand the discharge instructions provided?: Yes Medications obtained,verified, and reconciled?: Yes (Medications Reviewed) Any new allergies since your discharge?: No Dietary orders reviewed?: Yes Do you have support at home?: Yes People in Home: spouse  Medications Reviewed Today: Medications Reviewed Today     Reviewed by Karena Addison, LPN (Licensed Practical Nurse) on 08/10/23 at 1043  Med List Status: <None>   Medication Order Taking? Sig Documenting Provider Last Dose Status Informant  acetaminophen (TYLENOL) 500 MG tablet [id number]  Take 2 tablets (1,000 mg total) by mouth every 6 (six) hours as needed. Barnetta Chapel, PA-C  Active   amLODipine (NORVASC) 5 MG tablet [id number] No TAKE 1 TABLET (5 MG TOTAL) BY MOUTH DAILY. [name], MD 08/05/2023 Active Self, Spouse/Significant Other  amoxicillin-clavulanate (AUGMENTIN) 875-125 MG tablet [id number]  Take 1 tablet by mouth 2 (two) times daily for 3 days. Barnetta Chapel, PA-C  Active   celecoxib (CELEBREX) 200 MG capsule [id number] No TAKE 1 CAPSULE BY MOUTH EVERY DAY (NEED PRIMARY INS) [name], MD 08/05/2023 Active Self, Spouse/Significant Other  gabapentin (NEURONTIN) 100 MG capsule [id number] No TAKE 1 CAPSULE BY MOUTH THREE TIMES A DAY [name], MD 08/05/2023 Active Self, Spouse/Significant Other  levocetirizine (XYZAL) 5 MG  tablet [id number] No Take 1 tablet (5 mg total) by mouth every evening. [name], MD Past Week Active Self, Spouse/Significant Other  Multiple Vitamins-Minerals (CENTRUM SILVER PO) [id number] No Take 1 tablet by mouth daily. [provider] 08/05/2023 Active Self, Spouse/Significant Other  Multiple Vitamins-Minerals (EYE VITAMINS PO) [id number] No Take 2 capsules by mouth daily. EyePromise brand vitamins [provider] 08/05/2023 Active Spouse/Significant Other, Self  omeprazole (PRILOSEC) 20 MG capsule [id number] No Take 20 mg by mouth daily. [provider] 08/05/2023 Active Self, Spouse/Significant Other  pravastatin (PRAVACHOL) 80 MG tablet [id number] No TAKE 1 TABLET BY MOUTH EVERY DAY [name], MD 08/05/2023 Active Self, Spouse/Significant Other  senna (SENOKOT) 8.6 MG TABS tablet [id number]  Take 2 tablets (17.2 mg total) by mouth daily as needed for mild constipation or moderate constipation. Miguel Rota, MD  Active   traMADol (ULTRAM) 50 MG tablet [id number]  Take 1 tablet (50 mg total) by mouth every 6 (six) hours as needed for moderate pain or severe pain. Barnetta Chapel, PA-C  Active             Home Care and Equipment/Supplies: Were Home Health Services Ordered?: NA Any new equipment or medical supplies ordered?: NA  Functional Questionnaire: Do you need assistance with bathing/showering or dressing?: No Do you need assistance with meal preparation?: No Do you need assistance with eating?: No Do you have difficulty maintaining continence: No Do you need assistance with getting out of bed/getting out of a chair/moving?: No Do you have difficulty managing or taking your medications?: No  Follow up appointments reviewed: PCP Follow-up appointment confirmed?: Yes Date of PCP follow-up appointment?: 08/15/23 Follow-up Provider: [facility] Follow-up appointment confirmed?:  Yes Date of Specialist follow-up appointment?:  08/18/23 Follow-Up Specialty Provider:: surgeon Do you need transportation to your follow-up appointment?: No Do you understand care options if your condition(s) worsen?: Yes-patient verbalized understanding    SIGNATURE Karena Addison, LPN [facility] Nurse Health Advisor Direct Dial [phone]

## 2023-08-15 ENCOUNTER — Encounter: Payer: Self-pay | Admitting: Internal Medicine

## 2023-08-15 ENCOUNTER — Ambulatory Visit: Payer: Medicare Other | Admitting: Internal Medicine

## 2023-08-15 VITALS — BP 136/70 | HR 76 | Temp 98.2°F | Ht 67.0 in | Wt 202.0 lb

## 2023-08-15 DIAGNOSIS — E781 Pure hyperglyceridemia: Secondary | ICD-10-CM | POA: Diagnosis not present

## 2023-08-15 DIAGNOSIS — I10 Essential (primary) hypertension: Secondary | ICD-10-CM

## 2023-08-15 DIAGNOSIS — E785 Hyperlipidemia, unspecified: Secondary | ICD-10-CM

## 2023-08-15 DIAGNOSIS — J4489 Other specified chronic obstructive pulmonary disease: Secondary | ICD-10-CM

## 2023-08-15 DIAGNOSIS — Z23 Encounter for immunization: Secondary | ICD-10-CM

## 2023-08-15 LAB — LIPID PANEL
Cholesterol: 143 mg/dL (ref 0–200)
HDL: 34.1 mg/dL — ABNORMAL LOW (ref >39.00)
LDL Cholesterol: 67 mg/dL (ref 0–99)
NonHDL: 109.32
Total CHOL/HDL Ratio: 4
Triglycerides: 213 mg/dL — ABNORMAL HIGH (ref 0.0–149.0)
VLDL: 42.6 mg/dL — ABNORMAL HIGH (ref 0.0–40.0)

## 2023-08-15 MED ORDER — UMECLIDINIUM BROMIDE 62.5 MCG/ACT IN AEPB
1.0000 | INHALATION_SPRAY | Freq: Every day | RESPIRATORY_TRACT | 0 refills | Status: DC
Start: 2023-08-15 — End: 2024-02-20

## 2023-08-15 NOTE — Progress Notes (Unsigned)
Subjective:  Patient ID: Kenneth Moor Sr., male    DOB: 1956/09/16  Age: 67 y.o. MRN: [id number]  CC: Hypertension, COPD, and Hyperlipidemia   HPI Joyce Ellerbe [facility] Sr. presents for f/up -----  He is status post urgent cholecystectomy due to acute acalculous cholecystitis.  His wounds are healing and he denies abdominal pain, nausea, vomiting, fever, or chills.   PCP: [name], MD   Admit date: 08/06/2023 Discharge date: 08/09/2023   Admitted From: Home Disposition: Home   Recommendations for Outpatient Follow-up:  Follow up with PCP in 1-2 weeks Please obtain BMP/CBC in one week your next doctors visit.  Follow-up outpatient general surgery Oral Augmentin, pain medication prescribed     Discharge Condition: Stable CODE STATUS: Full code Diet recommendation: Low-fat   Brief/Interim Summary: Brief Narrative:  67 y/o followed for COPD, HTN, HLD, OA had the onset of abdominal pain Friday, 08/04/23 that was diffuse and persistent. He denies any nausea or vomiting, change in bowel habit except not having a BM since early Friday when the stool was the usual dark color due to vitamins he takes.  Upon admission CT scan showed concerns of possible acute cholecystitis/duodenitis.  HIDA showed concerns for acute cholecystitis and underwent laparoscopic cholecystectomy on 9/9.  Following day tolerating oral without any issues.  Cleared by general surgery for discharge.     Assessment & Plan:   Principal Problem:   Acute cholecystitis Active Problems:   Essential hypertension   COPD (chronic obstructive pulmonary disease) with chronic bronchitis (HCC)   Dyslipidemia, goal LDL below 130  Outpatient Medications Prior to Visit  Medication Sig Dispense Refill   acetaminophen (TYLENOL) 500 MG tablet Take 2 tablets (1,000 mg total) by mouth every 6 (six) hours as needed.     amLODipine (NORVASC) 5 MG tablet TAKE 1 TABLET (5 MG TOTAL) BY MOUTH DAILY. 90 tablet 0   celecoxib (CELEBREX) 200 MG  capsule TAKE 1 CAPSULE BY MOUTH EVERY DAY (NEED PRIMARY INS) 90 capsule 0   gabapentin (NEURONTIN) 100 MG capsule TAKE 1 CAPSULE BY MOUTH THREE TIMES A DAY 270 capsule 1   levocetirizine (XYZAL) 5 MG tablet Take 1 tablet (5 mg total) by mouth every evening. 90 tablet 1   Multiple Vitamins-Minerals (CENTRUM SILVER PO) Take 1 tablet by mouth daily.     Multiple Vitamins-Minerals (EYE VITAMINS PO) Take 2 capsules by mouth daily. EyePromise brand vitamins     omeprazole (PRILOSEC) 20 MG capsule Take 20 mg by mouth daily.     pravastatin (PRAVACHOL) 80 MG tablet TAKE 1 TABLET BY MOUTH EVERY DAY 90 tablet 0   senna (SENOKOT) 8.6 MG TABS tablet Take 2 tablets (17.2 mg total) by mouth daily as needed for mild constipation or moderate constipation. 60 tablet 0   traMADol (ULTRAM) 50 MG tablet Take 1 tablet (50 mg total) by mouth every 6 (six) hours as needed for moderate pain or severe pain. 10 tablet 0   No facility-administered medications prior to visit.    ROS Review of Systems  Constitutional: Negative.  Negative for appetite change, diaphoresis, fatigue and unexpected weight change.  HENT: Negative.    Eyes: Negative.   Respiratory:  Positive for cough. Negative for chest tightness, shortness of breath and wheezing.        +++ chronic cough productive of scan yellow phlegm  Cardiovascular:  Negative for chest pain, palpitations and leg swelling.  Gastrointestinal:  Negative for abdominal pain, constipation, diarrhea, nausea and vomiting.  Genitourinary: Negative.  Negative for difficulty urinating.  Musculoskeletal: Negative.   Skin: Negative.   Neurological:  Negative for dizziness and weakness.  Hematological:  Negative for adenopathy. Does not bruise/bleed easily.  Psychiatric/Behavioral: Negative.      Objective:  BP 136/70 (BP Location: Left Arm, Patient Position: Sitting, Cuff Size: Large)   Pulse 76   Temp 98.2 F (36.8 C) (Oral)   Ht 5\' 7"  (1.702 m)   Wt 202 lb (91.6 kg)    SpO2 95%   BMI 31.64 kg/m   BP Readings from Last 3 Encounters:  08/15/23 136/70  08/09/23 126/69  05/09/23 126/76    Wt Readings from Last 3 Encounters:  08/15/23 202 lb (91.6 kg)  08/08/23 220 lb (99.8 kg)  05/09/23 199 lb (90.3 kg)    Physical Exam Vitals reviewed.  HENT:     Nose: Nose normal.     Mouth/Throat:     Mouth: Mucous membranes are moist.  Eyes:     General: No scleral icterus.    Conjunctiva/sclera: Conjunctivae normal.  Cardiovascular:     Rate and Rhythm: Normal rate and regular rhythm.     Heart sounds: No murmur heard. Pulmonary:     Effort: Pulmonary effort is normal.     Breath sounds: No stridor. No wheezing, rhonchi or rales.  Abdominal:     General: Abdomen is flat.     Palpations: There is no mass.     Tenderness: There is no abdominal tenderness. There is no guarding.     Hernia: No hernia is present.  Musculoskeletal:        General: Normal range of motion.     Cervical back: Neck supple.     Right lower leg: No edema.     Left lower leg: No edema.  Lymphadenopathy:     Cervical: No cervical adenopathy.  Skin:    General: Skin is warm and dry.     Findings: No rash.  Neurological:     General: No focal deficit present.     Mental Status: He is alert. Mental status is at baseline.  Psychiatric:        Mood and Affect: Mood normal.        Behavior: Behavior normal.     Lab Results  Component Value Date   WBC 12.4 (H) 08/09/2023   HGB 13.0 08/09/2023   HCT 38.3 (L) 08/09/2023   PLT 234 08/09/2023   GLUCOSE 149 (H) 08/09/2023   CHOL 143 08/15/2023   TRIG 213.0 (H) 08/15/2023   HDL 34.10 (L) 08/15/2023   LDLDIRECT 105.0 07/02/2019   LDLCALC 67 08/15/2023   ALT 29 08/09/2023   AST 34 08/09/2023   NA 135 08/09/2023   K 4.0 08/09/2023   CL 104 08/09/2023   CREATININE 0.72 08/09/2023   BUN 6 (L) 08/09/2023   CO2 23 08/09/2023   TSH 3.08 10/18/2022   PSA 0.26 10/18/2022   INR 1.0 05/09/2023   HGBA1C 5.2 08/06/2023        US Abdomen Limited RUQ (LIVER/GB)  Result Date: 08/06/2023 CLINICAL DATA:  67 year old male with history of epigastric pain. EXAM: ULTRASOUND ABDOMEN LIMITED RIGHT UPPER QUADRANT COMPARISON:  Right upper quadrant abdominal ultrasound 07/13/2019. FINDINGS: Gallbladder: No gallstones or wall thickening visualized. No sonographic Murphy sign noted by sonographer. Common bile duct: Diameter: 5.6 mm Liver: No focal lesion identified. Liver is diffusely heterogeneously echogenic, suggesting a background of cirrhosis. There is also a slight irregular contour of the liver, suggesting  underlying cirrhosis. Portal vein is patent on color Doppler imaging with normal direction of blood flow towards the liver. Other: None. IMPRESSION: 1. No acute findings. Specifically, no gallstones and no evidence of acute cholecystitis. 2. Hepatic steatosis. 3. Nodular contour of the liver suggesting early changes of cirrhosis. Electronically Signed   By: Trudie Reed M.D.   On: 08/06/2023 07:59   CT Angio Chest/Abd/Pel for Dissection W and/or Wo Contrast  Result Date: 08/06/2023 CLINICAL DATA:  67 year old male with history of substernal chest pain radiating into the mid back and down to the umbilical region for 1 day. Clinical suspicion for acute aortic syndrome. EXAM: CT ANGIOGRAPHY CHEST, ABDOMEN AND PELVIS TECHNIQUE: Non-contrast CT of the chest was initially obtained. Multidetector CT imaging through the chest, abdomen and pelvis was performed using the standard protocol during bolus administration of intravenous contrast. Multiplanar reconstructed images and MIPs were obtained and reviewed to evaluate the vascular anatomy. RADIATION DOSE REDUCTION: This exam was performed according to the departmental dose-optimization program which includes automated exposure control, adjustment of the mA and/or kV according to patient size and/or use of iterative reconstruction technique. CONTRAST:  OMNIPAQUE IOHEXOL 350  MG/ML SOLN COMPARISON:  No priors. FINDINGS: CTA CHEST FINDINGS Cardiovascular: Atherosclerotic calcifications are noted throughout the thoracic aorta. No definite coronary artery calcifications are noted. Precontrast images demonstrate no crescentic high attenuation associated with the wall of the thoracic aorta to indicate the presence of acute intramural hemorrhage. Postcontrast images demonstrate no aneurysm or dissection of the thoracic aorta. Ascending thoracic aorta, mid arch and descending thoracic aorta are normal in caliber measuring 3.0 cm, 2.5 cm and 2.5 cm in diameter respectively. Heart size is normal. There is no significant pericardial fluid, thickening or pericardial calcification. Mediastinum/Nodes: No pathologically enlarged mediastinal or hilar lymph nodes. Esophagus is unremarkable in appearance. No axillary lymphadenopathy. Lungs/Pleura: No acute consolidative airspace disease. No pleural effusions. No definite suspicious appearing pulmonary nodules or masses are noted. Mild diffuse bronchial wall thickening with mild centrilobular and paraseptal emphysema. Musculoskeletal: There are no aggressive appearing lytic or blastic lesions noted in the visualized portions of the skeleton. Review of the MIP images confirms the above findings. CTA ABDOMEN AND PELVIS FINDINGS VASCULAR Aorta: Aortic atherosclerosis. Normal caliber aorta without aneurysm, dissection, vasculitis or significant stenosis. Celiac: Patent without evidence of aneurysm, dissection, vasculitis or significant stenosis. SMA: Patent without evidence of aneurysm, dissection, vasculitis or significant stenosis. Renals: Both renal arteries are patent without evidence of aneurysm, dissection, vasculitis, fibromuscular dysplasia or significant stenosis. IMA: Patent without evidence of aneurysm, dissection, vasculitis or significant stenosis. Inflow: Patent without evidence of aneurysm, dissection, vasculitis or significant stenosis. Veins:  No obvious venous abnormality within the limitations of this arterial phase study. Review of the MIP images confirms the above findings. NON-VASCULAR Hepatobiliary: No definite suspicious cystic or solid hepatic lesions are noted on today's arterial phase examination. There is diffuse low attenuation throughout the hepatic parenchyma, indicative of a background of hepatic steatosis. No intra or extrahepatic biliary ductal dilatation. Gallbladder is moderately distended. There is a suggestion of some stones and/or sludge lying dependently in the gallbladder. Trace amount of soft tissue stranding surrounding the gallbladder. Pancreas: No pancreatic mass. No pancreatic ductal dilatation. No pancreatic or peripancreatic fluid collections or inflammatory changes. Spleen: Unremarkable. Adrenals/Urinary Tract: Bilateral kidneys and bilateral adrenal glands are normal in appearance. No hydroureteronephrosis. Urinary bladder is moderately distended, but otherwise unremarkable in appearance. Stomach/Bowel: The appearance of the stomach is normal. Subtle soft tissue stranding is noted  adjacent to the second portion of the duodenum. No pathologic dilatation of small bowel or colon. Normal appendix. Lymphatic: No lymphadenopathy noted in the abdomen or pelvis. Reproductive: Prostate gland and seminal vesicles are unremarkable in appearance. Other: No significant volume of ascites. No pneumoperitoneum. Small umbilical hernia containing only omental fat. Musculoskeletal: There are no aggressive appearing lytic or blastic lesions noted in the visualized portions of the skeleton. Review of the MIP images confirms the above findings. IMPRESSION: 1. While there is aortic atherosclerosis, there are no findings to suggest acute aortic syndrome. 2. Gallbladder is moderately distended with a suggestion of some dependent stones and/or biliary sludge, as well as a trace amount of surrounding soft tissue stranding. These findings could  indicate an acute cholecystitis. Further clinical evaluation is recommended. Consideration for follow-up right upper quadrant abdominal ultrasound is recommended if clinically appropriate. 3. Subtle inflammatory changes also noted adjacent to the second portion of the duodenal. If there is no evidence of acute cholecystitis, clinical correlation for signs and symptoms of potential duodenitis is suggested. 4. Hepatic steatosis. 5. Tiny umbilical hernia containing only omental fat. No associated bowel incarceration or obstruction at this time. 6. Mild diffuse bronchial wall thickening with mild centrilobular and paraseptal emphysema; imaging findings suggestive of underlying COPD. Electronically Signed   By: Trudie Reed M.D.   On: 08/06/2023 05:38   DG Chest 2 View  Result Date: 08/06/2023 CLINICAL DATA:  Chest pain. EXAM: CHEST - 2 VIEW COMPARISON:  Chest x-ray 03/12/2013. FINDINGS: The heart size and mediastinal contours are within normal limits. Both lungs are clear. The visualized skeletal structures are unremarkable. IMPRESSION: No active cardiopulmonary disease. Electronically Signed   By: Darliss Cheney M.D.   On: 08/06/2023 01:29    Assessment & Plan:  Flu vaccine need -     Flu Vaccine Trivalent High Dose (Fluad)  COPD (chronic obstructive pulmonary disease) with chronic bronchitis (HCC) - Will treat with an inhaled LAMA. -     Umeclidinium Bromide; Inhale 1 puff into the lungs daily.  Dispense: 120 each; Refill: 0  Essential hypertension- His blood pressure is well-controlled.  Dyslipidemia, goal LDL below 130 - LDL goal achieved. Doing well on the statin  -     Lipid panel; Future  Hypertriglyceridemia, essential - His triglycerides are normal now.     Follow-up: Return in about 4 months (around 12/15/2023).  Sanda Linger, MD

## 2023-08-15 NOTE — Patient Instructions (Signed)

## 2023-08-27 ENCOUNTER — Other Ambulatory Visit: Payer: Self-pay | Admitting: Internal Medicine

## 2023-08-27 DIAGNOSIS — I10 Essential (primary) hypertension: Secondary | ICD-10-CM

## 2023-08-27 MED ORDER — AMLODIPINE BESYLATE 5 MG PO TABS
5.0000 mg | ORAL_TABLET | Freq: Every day | ORAL | 0 refills | Status: DC
Start: 2023-08-27 — End: 2023-11-22

## 2023-09-16 ENCOUNTER — Other Ambulatory Visit: Payer: Self-pay | Admitting: Internal Medicine

## 2023-09-16 DIAGNOSIS — E785 Hyperlipidemia, unspecified: Secondary | ICD-10-CM

## 2023-10-05 ENCOUNTER — Other Ambulatory Visit: Payer: Self-pay | Admitting: Internal Medicine

## 2023-10-05 DIAGNOSIS — G8929 Other chronic pain: Secondary | ICD-10-CM

## 2023-11-04 ENCOUNTER — Other Ambulatory Visit: Payer: Self-pay | Admitting: Internal Medicine

## 2023-11-04 DIAGNOSIS — T783XXA Angioneurotic edema, initial encounter: Secondary | ICD-10-CM

## 2023-11-04 DIAGNOSIS — L2084 Intrinsic (allergic) eczema: Secondary | ICD-10-CM

## 2023-11-20 ENCOUNTER — Other Ambulatory Visit: Payer: Self-pay | Admitting: Internal Medicine

## 2023-11-20 DIAGNOSIS — I10 Essential (primary) hypertension: Secondary | ICD-10-CM

## 2023-11-24 ENCOUNTER — Other Ambulatory Visit: Payer: Self-pay | Admitting: Internal Medicine

## 2023-11-24 DIAGNOSIS — G8929 Other chronic pain: Secondary | ICD-10-CM

## 2023-11-24 DIAGNOSIS — M150 Primary generalized (osteo)arthritis: Secondary | ICD-10-CM

## 2023-12-14 ENCOUNTER — Other Ambulatory Visit: Payer: Self-pay | Admitting: Internal Medicine

## 2023-12-14 DIAGNOSIS — E785 Hyperlipidemia, unspecified: Secondary | ICD-10-CM

## 2023-12-27 ENCOUNTER — Telehealth: Payer: Self-pay

## 2023-12-27 NOTE — Telephone Encounter (Signed)
Copied from CRM [id number]. Topic: Clinical - Prescription Issue >> Dec 26, 2023  4:59 PM Sonny Dandy B wrote: Reason for CRM: pt called to  change his pharmacy to [facility] on [address].  [phone] pt would like all medication, new prescription sent to new pharmacy. [facility].  To amLODipine (NORVASC) 5 MG tablet, celecoxib (CELEBREX) 200 MG capsule, pravastatin (PRAVACHOL) 80 MG tablet,  Pharmacy need new prescription for all medication.

## 2023-12-27 NOTE — Telephone Encounter (Signed)
Pharmacy has been updated and patient and his wife has been mae aware that when it's time for a refill it will be sent to that pharmacy.

## 2024-02-20 ENCOUNTER — Ambulatory Visit (INDEPENDENT_AMBULATORY_CARE_PROVIDER_SITE_OTHER): Payer: PRIVATE HEALTH INSURANCE | Admitting: Internal Medicine

## 2024-02-20 ENCOUNTER — Encounter: Payer: Self-pay | Admitting: Internal Medicine

## 2024-02-20 VITALS — BP 132/78 | HR 76 | Temp 98.1°F | Resp 16 | Ht 67.0 in | Wt 208.4 lb

## 2024-02-20 DIAGNOSIS — K7581 Nonalcoholic steatohepatitis (NASH): Secondary | ICD-10-CM | POA: Diagnosis not present

## 2024-02-20 DIAGNOSIS — E785 Hyperlipidemia, unspecified: Secondary | ICD-10-CM | POA: Diagnosis not present

## 2024-02-20 DIAGNOSIS — M150 Primary generalized (osteo)arthritis: Secondary | ICD-10-CM

## 2024-02-20 DIAGNOSIS — I10 Essential (primary) hypertension: Secondary | ICD-10-CM | POA: Diagnosis not present

## 2024-02-20 DIAGNOSIS — G8929 Other chronic pain: Secondary | ICD-10-CM

## 2024-02-20 DIAGNOSIS — N400 Enlarged prostate without lower urinary tract symptoms: Secondary | ICD-10-CM | POA: Diagnosis not present

## 2024-02-20 DIAGNOSIS — M549 Dorsalgia, unspecified: Secondary | ICD-10-CM

## 2024-02-20 DIAGNOSIS — J4489 Other specified chronic obstructive pulmonary disease: Secondary | ICD-10-CM

## 2024-02-20 LAB — CBC WITH DIFFERENTIAL/PLATELET
Basophils Absolute: 0 10*3/uL (ref 0.0–0.1)
Basophils Relative: 0.8 % (ref 0.0–3.0)
Eosinophils Absolute: 0.1 10*3/uL (ref 0.0–0.7)
Eosinophils Relative: 2.2 % (ref 0.0–5.0)
HCT: 42.9 % (ref 39.0–52.0)
Hemoglobin: 14.9 g/dL (ref 13.0–17.0)
Lymphocytes Relative: 19.1 % (ref 12.0–46.0)
Lymphs Abs: 1 10*3/uL (ref 0.7–4.0)
MCHC: 34.8 g/dL (ref 30.0–36.0)
MCV: 97.3 fl (ref 78.0–100.0)
Monocytes Absolute: 0.6 10*3/uL (ref 0.1–1.0)
Monocytes Relative: 12.1 % — ABNORMAL HIGH (ref 3.0–12.0)
Neutro Abs: 3.4 10*3/uL (ref 1.4–7.7)
Neutrophils Relative %: 65.8 % (ref 43.0–77.0)
Platelets: 247 10*3/uL (ref 150.0–400.0)
RBC: 4.41 Mil/uL (ref 4.22–5.81)
RDW: 12.3 % (ref 11.5–15.5)
WBC: 5.2 10*3/uL (ref 4.0–10.5)

## 2024-02-20 LAB — HEPATIC FUNCTION PANEL
ALT: 43 U/L (ref 0–53)
AST: 47 U/L — ABNORMAL HIGH (ref 0–37)
Albumin: 4.2 g/dL (ref 3.5–5.2)
Alkaline Phosphatase: 86 U/L (ref 39–117)
Bilirubin, Direct: 0.1 mg/dL (ref 0.0–0.3)
Total Bilirubin: 0.5 mg/dL (ref 0.2–1.2)
Total Protein: 6.6 g/dL (ref 6.0–8.3)

## 2024-02-20 LAB — BASIC METABOLIC PANEL
BUN: 11 mg/dL (ref 6–23)
CO2: 26 meq/L (ref 19–32)
Calcium: 9.1 mg/dL (ref 8.4–10.5)
Chloride: 103 meq/L (ref 96–112)
Creatinine, Ser: 0.72 mg/dL (ref 0.40–1.50)
GFR: 94.84 mL/min (ref >60.00)
Glucose, Bld: 89 mg/dL (ref 70–99)
Potassium: 3.8 meq/L (ref 3.5–5.1)
Sodium: 137 meq/L (ref 135–145)

## 2024-02-20 LAB — TSH: TSH: 2.06 u[IU]/mL (ref 0.35–5.50)

## 2024-02-20 LAB — PSA: PSA: 0.43 ng/mL (ref 0.10–4.00)

## 2024-02-20 NOTE — Progress Notes (Unsigned)
 Subjective:  Patient ID: Kenneth Moor Sr., male    DOB: 1956-08-03  Age: 68 y.o. MRN: [id number]  CC: Hypertension   HPI Kenneth Peters [facility] Sr. presents for f/up ----  Discussed the use of AI scribe software for clinical note transcription with the patient, who gave verbal consent to proceed.  History of Present Illness   Kenneth HAVEN Sr. is a 68 year old male who presents for routine follow-up.  He has a history of hypertension. No chest pain, shortness of breath, dizziness, or lightheadedness during physical activity. He has not experienced any side effects from his current medications.  He receives injections in his hand and knees for arthritis and remains active. No side effects from his current medications.  He has a longstanding history of back pain and continues to take tramadol for management. No issues with urination or breathing.  He quit smoking approximately 20 years ago. No lingering respiratory symptoms such as cough, wheezing, or shortness of breath. He has not been using the inhaler Incruse due to its cost, despite insurance coverage.       Outpatient Medications Prior to Visit  Medication Sig Dispense Refill   acetaminophen (TYLENOL) 500 MG tablet Take 2 tablets (1,000 mg total) by mouth every 6 (six) hours as needed.     gabapentin (NEURONTIN) 100 MG capsule TAKE 1 CAPSULE BY MOUTH THREE TIMES A DAY 270 capsule 1   levocetirizine (XYZAL) 5 MG tablet TAKE 1 TABLET BY MOUTH EVERY DAY IN THE EVENING 90 tablet 1   Multiple Vitamins-Minerals (CENTRUM SILVER PO) Take 1 tablet by mouth daily.     Multiple Vitamins-Minerals (EYE VITAMINS PO) Take 2 capsules by mouth daily. EyePromise brand vitamins     omeprazole (PRILOSEC) 20 MG capsule Take 20 mg by mouth daily.     pravastatin (PRAVACHOL) 80 MG tablet TAKE 1 TABLET BY MOUTH EVERY DAY 90 tablet 0   amLODipine (NORVASC) 5 MG tablet TAKE 1 TABLET (5 MG TOTAL) BY MOUTH DAILY. 90 tablet 0   celecoxib (CELEBREX) 200 MG capsule TAKE 1  CAPSULE BY MOUTH EVERY DAY 90 capsule 0   traMADol (ULTRAM) 50 MG tablet Take 1 tablet (50 mg total) by mouth every 6 (six) hours as needed for moderate pain or severe pain. 10 tablet 0   senna (SENOKOT) 8.6 MG TABS tablet Take 2 tablets (17.2 mg total) by mouth daily as needed for mild constipation or moderate constipation. 60 tablet 0   umeclidinium bromide (INCRUSE ELLIPTA) 62.5 MCG/ACT AEPB Inhale 1 puff into the lungs daily. 120 each 0   No facility-administered medications prior to visit.    ROS Review of Systems  Constitutional: Negative.  Negative for appetite change, diaphoresis, fatigue and unexpected weight change.  HENT: Negative.    Eyes: Negative.   Respiratory:  Positive for shortness of breath. Negative for cough, chest tightness and wheezing.   Cardiovascular:  Negative for chest pain, palpitations and leg swelling.  Gastrointestinal: Negative.  Negative for abdominal pain, constipation, diarrhea, nausea and vomiting.  Endocrine: Negative.   Genitourinary: Negative.  Negative for difficulty urinating.  Musculoskeletal:  Positive for arthralgias and back pain.  Skin: Negative.   Neurological: Negative.  Negative for dizziness and weakness.  Hematological:  Negative for adenopathy. Does not bruise/bleed easily.  Psychiatric/Behavioral: Negative.      Objective:  BP 132/78 (BP Location: Right Arm, Patient Position: Sitting, Cuff Size: Large)   Pulse 76   Temp 98.1 F (36.7 C) (Oral)  Resp 16   Ht 5\' 7"  (1.702 m)   Wt 208 lb 6.4 oz (94.5 kg)   SpO2 96%   BMI 32.64 kg/m   BP Readings from Last 3 Encounters:  02/20/24 132/78  08/15/23 136/70  08/09/23 126/69    Wt Readings from Last 3 Encounters:  02/20/24 208 lb 6.4 oz (94.5 kg)  08/15/23 202 lb (91.6 kg)  08/08/23 220 lb (99.8 kg)    Physical Exam Vitals reviewed.  Constitutional:      Appearance: Normal appearance.  HENT:     Mouth/Throat:     Mouth: Mucous membranes are moist.  Eyes:      General: No scleral icterus.    Conjunctiva/sclera: Conjunctivae normal.  Cardiovascular:     Rate and Rhythm: Normal rate and regular rhythm.     Heart sounds: Normal heart sounds, S1 normal and S2 normal. No murmur heard.    No friction rub. No gallop.     Comments: EKG- NSR, 72 bpm No LVH, Q waves, or ST/T wave changes  Pulmonary:     Effort: Pulmonary effort is normal.     Breath sounds: No stridor. No wheezing, rhonchi or rales.  Abdominal:     General: Abdomen is flat.     Palpations: There is no mass.     Tenderness: There is no abdominal tenderness. There is no guarding.     Hernia: No hernia is present.  Musculoskeletal:     Right lower leg: No edema.     Left lower leg: No edema.  Lymphadenopathy:     Cervical: No cervical adenopathy.  Skin:    General: Skin is warm.  Neurological:     General: No focal deficit present.     Mental Status: He is alert. Mental status is at baseline.  Psychiatric:        Mood and Affect: Mood normal.        Behavior: Behavior normal.     Lab Results  Component Value Date   WBC 5.2 02/20/2024   HGB 14.9 02/20/2024   HCT 42.9 02/20/2024   PLT 247.0 02/20/2024   GLUCOSE 89 02/20/2024   CHOL 143 08/15/2023   TRIG 213.0 (H) 08/15/2023   HDL 34.10 (L) 08/15/2023   LDLDIRECT 105.0 07/02/2019   LDLCALC 67 08/15/2023   ALT 43 02/20/2024   AST 47 (H) 02/20/2024   NA 137 02/20/2024   K 3.8 02/20/2024   CL 103 02/20/2024   CREATININE 0.72 02/20/2024   BUN 11 02/20/2024   CO2 26 02/20/2024   TSH 2.06 02/20/2024   PSA 0.43 02/20/2024   INR 1.0 05/09/2023   HGBA1C 5.2 08/06/2023    US Abdomen Limited RUQ (LIVER/GB) Result Date: 08/06/2023 CLINICAL DATA:  68 year old male with history of epigastric pain. EXAM: ULTRASOUND ABDOMEN LIMITED RIGHT UPPER QUADRANT COMPARISON:  Right upper quadrant abdominal ultrasound 07/13/2019. FINDINGS: Gallbladder: No gallstones or wall thickening visualized. No sonographic Murphy sign noted by  sonographer. Common bile duct: Diameter: 5.6 mm Liver: No focal lesion identified. Liver is diffusely heterogeneously echogenic, suggesting a background of cirrhosis. There is also a slight irregular contour of the liver, suggesting underlying cirrhosis. Portal vein is patent on color Doppler imaging with normal direction of blood flow towards the liver. Other: None. IMPRESSION: 1. No acute findings. Specifically, no gallstones and no evidence of acute cholecystitis. 2. Hepatic steatosis. 3. Nodular contour of the liver suggesting early changes of cirrhosis. Electronically Signed   By: Brayton Mars.D.  On: 08/06/2023 07:59   CT Angio Chest/Abd/Pel for Dissection W and/or Wo Contrast Result Date: 08/06/2023 CLINICAL DATA:  68 year old male with history of substernal chest pain radiating into the mid back and down to the umbilical region for 1 day. Clinical suspicion for acute aortic syndrome. EXAM: CT ANGIOGRAPHY CHEST, ABDOMEN AND PELVIS TECHNIQUE: Non-contrast CT of the chest was initially obtained. Multidetector CT imaging through the chest, abdomen and pelvis was performed using the standard protocol during bolus administration of intravenous contrast. Multiplanar reconstructed images and MIPs were obtained and reviewed to evaluate the vascular anatomy. RADIATION DOSE REDUCTION: This exam was performed according to the departmental dose-optimization program which includes automated exposure control, adjustment of the mA and/or kV according to patient size and/or use of iterative reconstruction technique. CONTRAST:  OMNIPAQUE IOHEXOL 350 MG/ML SOLN COMPARISON:  No priors. FINDINGS: CTA CHEST FINDINGS Cardiovascular: Atherosclerotic calcifications are noted throughout the thoracic aorta. No definite coronary artery calcifications are noted. Precontrast images demonstrate no crescentic high attenuation associated with the wall of the thoracic aorta to indicate the presence of acute intramural  hemorrhage. Postcontrast images demonstrate no aneurysm or dissection of the thoracic aorta. Ascending thoracic aorta, mid arch and descending thoracic aorta are normal in caliber measuring 3.0 cm, 2.5 cm and 2.5 cm in diameter respectively. Heart size is normal. There is no significant pericardial fluid, thickening or pericardial calcification. Mediastinum/Nodes: No pathologically enlarged mediastinal or hilar lymph nodes. Esophagus is unremarkable in appearance. No axillary lymphadenopathy. Lungs/Pleura: No acute consolidative airspace disease. No pleural effusions. No definite suspicious appearing pulmonary nodules or masses are noted. Mild diffuse bronchial wall thickening with mild centrilobular and paraseptal emphysema. Musculoskeletal: There are no aggressive appearing lytic or blastic lesions noted in the visualized portions of the skeleton. Review of the MIP images confirms the above findings. CTA ABDOMEN AND PELVIS FINDINGS VASCULAR Aorta: Aortic atherosclerosis. Normal caliber aorta without aneurysm, dissection, vasculitis or significant stenosis. Celiac: Patent without evidence of aneurysm, dissection, vasculitis or significant stenosis. SMA: Patent without evidence of aneurysm, dissection, vasculitis or significant stenosis. Renals: Both renal arteries are patent without evidence of aneurysm, dissection, vasculitis, fibromuscular dysplasia or significant stenosis. IMA: Patent without evidence of aneurysm, dissection, vasculitis or significant stenosis. Inflow: Patent without evidence of aneurysm, dissection, vasculitis or significant stenosis. Veins: No obvious venous abnormality within the limitations of this arterial phase study. Review of the MIP images confirms the above findings. NON-VASCULAR Hepatobiliary: No definite suspicious cystic or solid hepatic lesions are noted on today's arterial phase examination. There is diffuse low attenuation throughout the hepatic parenchyma, indicative of a  background of hepatic steatosis. No intra or extrahepatic biliary ductal dilatation. Gallbladder is moderately distended. There is a suggestion of some stones and/or sludge lying dependently in the gallbladder. Trace amount of soft tissue stranding surrounding the gallbladder. Pancreas: No pancreatic mass. No pancreatic ductal dilatation. No pancreatic or peripancreatic fluid collections or inflammatory changes. Spleen: Unremarkable. Adrenals/Urinary Tract: Bilateral kidneys and bilateral adrenal glands are normal in appearance. No hydroureteronephrosis. Urinary bladder is moderately distended, but otherwise unremarkable in appearance. Stomach/Bowel: The appearance of the stomach is normal. Subtle soft tissue stranding is noted adjacent to the second portion of the duodenum. No pathologic dilatation of small bowel or colon. Normal appendix. Lymphatic: No lymphadenopathy noted in the abdomen or pelvis. Reproductive: Prostate gland and seminal vesicles are unremarkable in appearance. Other: No significant volume of ascites. No pneumoperitoneum. Small umbilical hernia containing only omental fat. Musculoskeletal: There are no aggressive appearing lytic or blastic  lesions noted in the visualized portions of the skeleton. Review of the MIP images confirms the above findings. IMPRESSION: 1. While there is aortic atherosclerosis, there are no findings to suggest acute aortic syndrome. 2. Gallbladder is moderately distended with a suggestion of some dependent stones and/or biliary sludge, as well as a trace amount of surrounding soft tissue stranding. These findings could indicate an acute cholecystitis. Further clinical evaluation is recommended. Consideration for follow-up right upper quadrant abdominal ultrasound is recommended if clinically appropriate. 3. Subtle inflammatory changes also noted adjacent to the second portion of the duodenal. If there is no evidence of acute cholecystitis, clinical correlation for signs  and symptoms of potential duodenitis is suggested. 4. Hepatic steatosis. 5. Tiny umbilical hernia containing only omental fat. No associated bowel incarceration or obstruction at this time. 6. Mild diffuse bronchial wall thickening with mild centrilobular and paraseptal emphysema; imaging findings suggestive of underlying COPD. Electronically Signed   By: Trudie Reed M.D.   On: 08/06/2023 05:38   DG Chest 2 View Result Date: 08/06/2023 CLINICAL DATA:  Chest pain. EXAM: CHEST - 2 VIEW COMPARISON:  Chest x-ray 03/12/2013. FINDINGS: The heart size and mediastinal contours are within normal limits. Both lungs are clear. The visualized skeletal structures are unremarkable. IMPRESSION: No active cardiopulmonary disease. Electronically Signed   By: Darliss Cheney M.D.   On: 08/06/2023 01:29    Assessment & Plan:   Dyslipidemia, goal LDL below 130- LDL goal achieved. Doing well on the statin  -     TSH; Future -     Hepatic function panel; Future  Nonalcoholic steatohepatitis (NASH) -     Hepatic function panel; Future  Essential hypertension- BP is well controlled. EKG is negative for LVH. -     CBC with Differential/Platelet; Future -     Hepatic function panel; Future -     Urinalysis, Routine w reflex microscopic; Future -     Basic metabolic panel; Future -     EKG 12-Lead  Benign prostatic hyperplasia without lower urinary tract symptoms -     PSA; Future  Back pain, chronic -     Celecoxib; Take 1 capsule (200 mg total) by mouth daily.  Dispense: 90 capsule; Refill: 1  Primary osteoarthritis involving multiple joints -     Celecoxib; Take 1 capsule (200 mg total) by mouth daily.  Dispense: 90 capsule; Refill: 1  COPD (chronic obstructive pulmonary disease) with chronic bronchitis (HCC) -     Breztri Aerosphere; Inhale 2 puffs into the lungs 2 (two) times daily.  Dispense: 32.1 g; Refill: 1     Follow-up: Return in about 6 months (around 08/22/2024).  Sanda Linger, MD

## 2024-02-20 NOTE — Patient Instructions (Signed)
 Hypertension, Adult High blood pressure (hypertension) is when the force of blood pumping through the arteries is too strong. The arteries are the blood vessels that carry blood from the heart throughout the body. Hypertension forces the heart to work harder to pump blood and may cause arteries to become narrow or stiff. Untreated or uncontrolled hypertension can lead to a heart attack, heart failure, a stroke, kidney disease, and other problems. A blood pressure reading consists of a higher number over a lower number. Ideally, your blood pressure should be below 120/80. The first ("top") number is called the systolic pressure. It is a measure of the pressure in your arteries as your heart beats. The second ("bottom") number is called the diastolic pressure. It is a measure of the pressure in your arteries as the heart relaxes. What are the causes? The exact cause of this condition is not known. There are some conditions that result in high blood pressure. What increases the risk? Certain factors may make you more likely to develop high blood pressure. Some of these risk factors are under your control, including: Smoking. Not getting enough exercise or physical activity. Being overweight. Having too much fat, sugar, calories, or salt (sodium) in your diet. Drinking too much alcohol. Other risk factors include: Having a personal history of heart disease, diabetes, high cholesterol, or kidney disease. Stress. Having a family history of high blood pressure and high cholesterol. Having obstructive sleep apnea. Age. The risk increases with age. What are the signs or symptoms? High blood pressure may not cause symptoms. Very high blood pressure (hypertensive crisis) may cause: Headache. Fast or irregular heartbeats (palpitations). Shortness of breath. Nosebleed. Nausea and vomiting. Vision changes. Severe chest pain, dizziness, and seizures. How is this diagnosed? This condition is diagnosed by  measuring your blood pressure while you are seated, with your arm resting on a flat surface, your legs uncrossed, and your feet flat on the floor. The cuff of the blood pressure monitor will be placed directly against the skin of your upper arm at the level of your heart. Blood pressure should be measured at least twice using the same arm. Certain conditions can cause a difference in blood pressure between your right and left arms. If you have a high blood pressure reading during one visit or you have normal blood pressure with other risk factors, you may be asked to: Return on a different day to have your blood pressure checked again. Monitor your blood pressure at home for 1 week or longer. If you are diagnosed with hypertension, you may have other blood or imaging tests to help your health care provider understand your overall risk for other conditions. How is this treated? This condition is treated by making healthy lifestyle changes, such as eating healthy foods, exercising more, and reducing your alcohol intake. You may be referred for counseling on a healthy diet and physical activity. Your health care provider may prescribe medicine if lifestyle changes are not enough to get your blood pressure under control and if: Your systolic blood pressure is above 130. Your diastolic blood pressure is above 80. Your personal target blood pressure may vary depending on your medical conditions, your age, and other factors. Follow these instructions at home: Eating and drinking  Eat a diet that is high in fiber and potassium, and low in sodium, added sugar, and fat. An example of this eating plan is called the DASH diet. DASH stands for Dietary Approaches to Stop Hypertension. To eat this way: Eat  plenty of fresh fruits and vegetables. Try to fill one half of your plate at each meal with fruits and vegetables. Eat whole grains, such as whole-wheat pasta, brown rice, or whole-grain bread. Fill about one  fourth of your plate with whole grains. Eat or drink low-fat dairy products, such as skim milk or low-fat yogurt. Avoid fatty cuts of meat, processed or cured meats, and poultry with skin. Fill about one fourth of your plate with lean proteins, such as fish, chicken without skin, beans, eggs, or tofu. Avoid pre-made and processed foods. These tend to be higher in sodium, added sugar, and fat. Reduce your daily sodium intake. Many people with hypertension should eat less than 1,500 mg of sodium a day. Do not drink alcohol if: Your health care provider tells you not to drink. You are pregnant, may be pregnant, or are planning to become pregnant. If you drink alcohol: Limit how much you have to: 0-1 drink a day for women. 0-2 drinks a day for men. Know how much alcohol is in your drink. In the [place] one drink equals one 12 oz bottle of beer (355 mL), one 5 oz glass of wine (148 mL), or one 1 oz glass of hard liquor (44 mL). Lifestyle  Work with your health care provider to maintain a healthy body weight or to lose weight. Ask what an ideal weight is for you. Get at least 30 minutes of exercise that causes your heart to beat faster (aerobic exercise) most days of the week. Activities may include walking, swimming, or biking. Include exercise to strengthen your muscles (resistance exercise), such as Pilates or lifting weights, as part of your weekly exercise routine. Try to do these types of exercises for 30 minutes at least 3 days a week. Do not use any products that contain nicotine or tobacco. These products include cigarettes, chewing tobacco, and vaping devices, such as e-cigarettes. If you need help quitting, ask your health care provider. Monitor your blood pressure at home as told by your health care provider. Keep all follow-up visits. This is important. Medicines Take over-the-counter and prescription medicines only as told by your health care provider. Follow directions carefully. Blood  pressure medicines must be taken as prescribed. Do not skip doses of blood pressure medicine. Doing this puts you at risk for problems and can make the medicine less effective. Ask your health care provider about side effects or reactions to medicines that you should watch for. Contact a health care provider if you: Think you are having a reaction to a medicine you are taking. Have headaches that keep coming back (recurring). Feel dizzy. Have swelling in your ankles. Have trouble with your vision. Get help right away if you: Develop a severe headache or confusion. Have unusual weakness or numbness. Feel faint. Have severe pain in your chest or abdomen. Vomit repeatedly. Have trouble breathing. These symptoms may be an emergency. Get help right away. Call 911. Do not wait to see if the symptoms will go away. Do not drive yourself to the hospital. Summary Hypertension is when the force of blood pumping through your arteries is too strong. If this condition is not controlled, it may put you at risk for serious complications. Your personal target blood pressure may vary depending on your medical conditions, your age, and other factors. For most people, a normal blood pressure is less than 120/80. Hypertension is treated with lifestyle changes, medicines, or a combination of both. Lifestyle changes include losing weight, eating a healthy,  low-sodium diet, exercising more, and limiting alcohol. This information is not intended to replace advice given to you by your health care provider. Make sure you discuss any questions you have with your health care provider. Document Revised: 09/22/2021 Document Reviewed: 09/22/2021 Elsevier Patient Education  2024 [employer].

## 2024-02-21 ENCOUNTER — Other Ambulatory Visit: Payer: Self-pay | Admitting: Internal Medicine

## 2024-02-21 ENCOUNTER — Encounter: Payer: Self-pay | Admitting: Internal Medicine

## 2024-02-21 DIAGNOSIS — I10 Essential (primary) hypertension: Secondary | ICD-10-CM

## 2024-02-21 LAB — URINALYSIS, ROUTINE W REFLEX MICROSCOPIC
Bilirubin Urine: NEGATIVE
Hgb urine dipstick: NEGATIVE
Ketones, ur: NEGATIVE
Leukocytes,Ua: NEGATIVE
Nitrite: NEGATIVE
RBC / HPF: NONE SEEN (ref 0–?)
Specific Gravity, Urine: <=1.005 — AB (ref 1.000–1.030)
Total Protein, Urine: NEGATIVE
Urine Glucose: NEGATIVE
Urobilinogen, UA: 0.2 (ref 0.0–1.0)
WBC, UA: NONE SEEN (ref 0–?)
pH: 6 (ref 5.0–8.0)

## 2024-02-22 ENCOUNTER — Other Ambulatory Visit: Payer: Self-pay | Admitting: Internal Medicine

## 2024-02-22 DIAGNOSIS — M150 Primary generalized (osteo)arthritis: Secondary | ICD-10-CM

## 2024-02-22 DIAGNOSIS — G8929 Other chronic pain: Secondary | ICD-10-CM

## 2024-02-22 MED ORDER — CELECOXIB 200 MG PO CAPS: 200.0000 mg | ORAL_CAPSULE | Freq: Every day | ORAL | 1 refills | Status: DC

## 2024-02-22 MED ORDER — BREZTRI AEROSPHERE 160-9-4.8 MCG/ACT IN AERO: 2.0000 | INHALATION_SPRAY | Freq: Two times a day (BID) | RESPIRATORY_TRACT | 1 refills | Status: DC

## 2024-03-22 ENCOUNTER — Other Ambulatory Visit: Payer: Self-pay | Admitting: Internal Medicine

## 2024-03-22 DIAGNOSIS — E785 Hyperlipidemia, unspecified: Secondary | ICD-10-CM

## 2024-03-26 ENCOUNTER — Other Ambulatory Visit: Payer: Self-pay | Admitting: Family Medicine

## 2024-03-26 DIAGNOSIS — E785 Hyperlipidemia, unspecified: Secondary | ICD-10-CM

## 2024-03-26 NOTE — Telephone Encounter (Signed)
 Copied from CRM [id number]. Topic: Clinical - Medication Refill >> Mar 26, 2024  1:22 PM Georgeann Kindred wrote: Most Recent Primary Care Visit:  Provider: Arcadio Knuckles  Department: [facility]  Visit Type: OFFICE VISIT  Date: 02/20/2024  Medication: pravastatin  (PRAVACHOL ) 80 MG tablet  Has the patient contacted their pharmacy? Yes (Agent: If no, request that the patient contact the pharmacy for the refill. If patient does not wish to contact the pharmacy document the reason why and proceed with request.) (Agent: If yes, when and what did the pharmacy advise?)  Is this the correct pharmacy for this prescription? Yes If no, delete pharmacy and type the correct one.  This is the patient's preferred pharmacy:  [id number] - [place], [place] - [address] AT [facility] OF [address] [place] [address] Phone: [phone] Fax: [phone]   Has the prescription been filled recently? No  Is the patient out of the medication? No 3 remaining   Has the patient been seen for an appointment in the last year OR does the patient have an upcoming appointment? Yes  Can we respond through MyChart? Yes  Agent: Please be advised that Rx refills may take up to 3 business days. We ask that you follow-up with your pharmacy.

## 2024-03-27 MED ORDER — PRAVASTATIN SODIUM 80 MG PO TABS: 80.0000 mg | ORAL_TABLET | Freq: Every day | ORAL | 0 refills | Status: DC

## 2024-04-12 ENCOUNTER — Other Ambulatory Visit: Payer: Self-pay | Admitting: Internal Medicine

## 2024-04-12 DIAGNOSIS — T783XXA Angioneurotic edema, initial encounter: Secondary | ICD-10-CM

## 2024-04-12 DIAGNOSIS — L2084 Intrinsic (allergic) eczema: Secondary | ICD-10-CM

## 2024-05-28 ENCOUNTER — Other Ambulatory Visit: Payer: Self-pay | Admitting: Internal Medicine

## 2024-05-28 DIAGNOSIS — I10 Essential (primary) hypertension: Secondary | ICD-10-CM

## 2024-05-29 ENCOUNTER — Other Ambulatory Visit: Payer: Self-pay | Admitting: Internal Medicine

## 2024-05-29 ENCOUNTER — Telehealth: Payer: Self-pay | Admitting: Internal Medicine

## 2024-05-29 DIAGNOSIS — G8929 Other chronic pain: Secondary | ICD-10-CM

## 2024-05-29 NOTE — Telephone Encounter (Unsigned)
 Copied from CRM [id number]. Topic: Clinical - Medication Refill >> May 29, 2024 12:07 PM Gennette ORN wrote: Medication: gabapentin  (NEURONTIN ) 100 MG capsule  Has the patient contacted their pharmacy? No (Agent: If no, request that the patient contact the pharmacy for the refill. If patient does not wish to contact the pharmacy document the reason why and proceed with request.) (Agent: If yes, when and what did the pharmacy advise?)  This is the patient's preferred pharmacy:  [id number] - [place], [place] - [address] AT [facility] OF [address] & [address]  [place] [address] Phone: [phone] Fax: [phone]   Is this the correct pharmacy for this prescription? Yes If no, delete pharmacy and type the correct one.   Has the prescription been filled recently? Yes  Is the patient out of the medication? No  Has the patient been seen for an appointment in the last year OR does the patient have an upcoming appointment? Yes  Can we respond through MyChart? Yes  Agent: Please be advised that Rx refills may take up to 3 business days. We ask that you follow-up with your pharmacy.

## 2024-05-29 NOTE — Telephone Encounter (Unsigned)
 Copied from CRM [id number]. Topic: Clinical - Medication Refill >> May 29, 2024 12:07 PM Gennette ORN wrote: Medication: gabapentin  (NEURONTIN ) 100 MG capsule  Has the patient contacted their pharmacy? No (Agent: If no, request that the patient contact the pharmacy for the refill. If patient does not wish to contact the pharmacy document the reason why and proceed with request.) (Agent: If yes, when and what did the pharmacy advise?)  This is the patient's preferred pharmacy:  [id number] - [place], [place] - [address] AT [facility] OF [address] [place] [address] Phone: [phone] Fax: [phone]   Is this the correct pharmacy for this prescription? Yes If no, delete pharmacy and type the correct one.   Has the prescription been filled recently? Yes  Is the patient out of the medication? No  Has the patient been seen for an appointment in the last year OR does the patient have an upcoming appointment? Yes  Can we respond through MyChart? Yes  Agent: Please be advised that Rx refills may take up to 3 business days. We ask that you follow-up with your pharmacy.

## 2024-05-30 MED ORDER — GABAPENTIN 100 MG PO CAPS: ORAL_CAPSULE | ORAL | 0 refills | Status: DC

## 2024-05-31 NOTE — Telephone Encounter (Signed)
 Medication has been refilled.

## 2024-06-24 ENCOUNTER — Other Ambulatory Visit: Payer: Self-pay | Admitting: Internal Medicine

## 2024-06-24 DIAGNOSIS — E785 Hyperlipidemia, unspecified: Secondary | ICD-10-CM

## 2024-06-26 ENCOUNTER — Ambulatory Visit

## 2024-07-20 ENCOUNTER — Ambulatory Visit

## 2024-07-20 VITALS — Ht 67.0 in | Wt 208.0 lb

## 2024-07-20 DIAGNOSIS — Z0000 Encounter for general adult medical examination without abnormal findings: Secondary | ICD-10-CM

## 2024-07-20 NOTE — Progress Notes (Signed)
 Subjective:  Please attest and cosign this visit due to patients primary care provider not being in the office at the time the visit was completed.  (Pt of Dr Debby Molt)   Kenneth Singleton [facility] is a 68 y.o. who presents for a Medicare Wellness preventive visit.  As a reminder, Annual Wellness Visits don't include a physical exam, and some assessments may be limited, especially if this visit is performed virtually. We may recommend an in-person follow-up visit with your provider if needed.  Visit Complete: Virtual I connected with  Kenneth JARNIGAN Sr. on 07/20/24 by a audio enabled telemedicine application and verified that I am speaking with the correct person using two identifiers.  Patient Location: Home  Provider Location: Office/Clinic  I discussed the limitations of evaluation and management by telemedicine. The patient expressed understanding and agreed to proceed.  Vital Signs: Because this visit was a virtual/telehealth visit, some criteria may be missing or patient reported. Any vitals not documented were not able to be obtained and vitals that have been documented are patient reported.  VideoDeclined- This patient declined Librarian, academic. Therefore the visit was completed with audio only.  Persons Participating in Visit: Patient.  AWV Questionnaire: No: Patient Medicare AWV questionnaire was not completed prior to this visit.  Cardiac Risk Factors include: advanced age (>71men, >65 women);dyslipidemia;hypertension;male gender;obesity (BMI >30kg/m2)     Objective:    Today's Vitals   07/20/24 1544  Weight: 208 lb (94.3 kg)  Height: 5' 7 (1.702 m)   Body mass index is 32.58 kg/m.     07/20/2024    3:44 PM 08/06/2023    1:58 PM 04/03/2015    8:00 PM 03/26/2015    9:09 AM 02/25/2015    2:08 PM  Advanced Directives  Does Patient Have a Medical Advance Directive? No No No  No  No   Would patient like information on creating a medical advance  directive? Yes (MAU/Ambulatory/Procedural Areas - Information given) No - Patient declined No - patient declined information  No - patient declined information  No - patient declined information      Data saved with a previous flowsheet row definition    Current Medications (verified) Outpatient Encounter Medications as of 07/20/2024  Medication Sig   acetaminophen  (TYLENOL ) 500 MG tablet Take 2 tablets (1,000 mg total) by mouth every 6 (six) hours as needed.   amLODipine  (NORVASC ) 5 MG tablet TAKE 1 TABLET BY MOUTH DAILY   budeson-glycopyrrolate -formoterol (BREZTRI  AEROSPHERE) 160-9-4.8 MCG/ACT AERO Inhale 2 puffs into the lungs 2 (two) times daily.   celecoxib  (CELEBREX ) 200 MG capsule Take 1 capsule (200 mg total) by mouth daily.   gabapentin  (NEURONTIN ) 100 MG capsule TAKE 1 CAPSULE BY MOUTH THREE TIMES A DAY   levocetirizine (XYZAL ) 5 MG tablet TAKE 1 TABLET BY MOUTH EVERY DAY IN THE EVENING   Multiple Vitamins-Minerals (CENTRUM SILVER PO) Take 1 tablet by mouth daily.   Multiple Vitamins-Minerals (EYE VITAMINS PO) Take 2 capsules by mouth daily. EyePromise brand vitamins   omeprazole (PRILOSEC) 20 MG capsule Take 20 mg by mouth daily.   pravastatin  (PRAVACHOL ) 80 MG tablet TAKE 1 TABLET(80 MG) BY MOUTH DAILY   No facility-administered encounter medications on file as of 07/20/2024.    Allergies (verified) Lisinopril , Lyrica  [pregabalin ], Olmesartan , and Atorvastatin   History: Past Medical History:  Diagnosis Date   Alcohol  abuse    Arthritis    Colon polyps 05/03/2008   diminutive (hyperplastis)   COPD (  chronic obstructive pulmonary disease) (HCC)    Elevated LFTs    GERD (gastroesophageal reflux disease)    Hyperlipidemia    Hypertension    Past Surgical History:  Procedure Laterality Date   CARPAL TUNNEL RELEASE     bilateral    CHOLECYSTECTOMY N/A 08/08/2023   Procedure: LAPAROSCOPIC CHOLECYSTECTOMY;  Surgeon: Dasie Leonor CROME, MD;  Location: [facility] OR;  Service: General;   Laterality: N/A;   COLONOSCOPY  05/03/2008   [facility]   right ear surgery      due to infection    TOTAL HIP ARTHROPLASTY Left 04/03/2015   Procedure: LEFT TOTAL HIP ARTHROPLASTY ANTERIOR APPROACH;  Surgeon: Redell Shoals, MD;  Location: [facility];  Service: Orthopedics;  Laterality: Left;   VASECTOMY     Family History  Problem Relation Age of Onset   Hypertension Father    Heart disease Father    Colon polyps Father    Hypertension Brother    Colon polyps Brother    Hypertension Sister    Lung cancer Sister    Alcohol  abuse Other    Arthritis Other    Diabetes Other    Hyperlipidemia Other    Hypertension Other    Early death Neg Hx    Stroke Neg Hx    Colon cancer Neg Hx    Esophageal cancer Neg Hx    Stomach cancer Neg Hx    Rectal cancer Neg Hx    Social History   Socioeconomic History   Marital status: Married    Spouse name: Not on file   Number of children: Not on file   Years of education: Not on file   Highest education level: 10th grade  Occupational History   Not on file  Tobacco Use   Smoking status: Former    Current packs/day: 0.00    Average packs/day: 1 pack/day for 30.0 years (30.0 ttl pk-yrs)    Types: Cigarettes    Start date: 07/01/1973    Quit date: 07/02/2003    Years since quitting: 21.0    Passive exposure: Past   Smokeless tobacco: Never  Vaping Use   Vaping status: Never Used  Substance and Sexual Activity   Alcohol  use: Yes    Alcohol /week: 42.0 standard drinks of alcohol     Types: 42 Cans of beer per week    Comment: 6 pack beers per day per pt   Drug use: No   Sexual activity: Yes    Partners: Female  Other Topics Concern   Not on file  Social History Narrative   Married   Social Drivers of Health   Financial Resource Strain: Low Risk  (07/20/2024)   Overall Financial Resource Strain (CARDIA)    Difficulty of Paying Living Expenses: Not hard at all  Food Insecurity: No Food Insecurity (07/20/2024)   Hunger Vital Sign    Worried  About Running Out of Food in the Last Year: Never true    Ran Out of Food in the Last Year: Never true  Transportation Needs: No Transportation Needs (07/20/2024)   PRAPARE - Administrator, Civil Service (Medical): No    Lack of Transportation (Non-Medical): No  Physical Activity: Inactive (07/20/2024)   Exercise Vital Sign    Days of Exercise per Week: 0 days    Minutes of Exercise per Session: 0 min  Stress: No Stress Concern Present (07/20/2024)   [employer] of Occupational Health - Occupational Stress Questionnaire    Feeling of Stress: Not at  all  Social Connections: Moderately Isolated (07/20/2024)   Social Connection and Isolation Panel    Frequency of Communication with Friends and Family: Twice a week    Frequency of Social Gatherings with Friends and Family: Once a week    Attends Religious Services: Never    Database administrator or Organizations: No    Attends Engineer, structural: Never    Marital Status: Married    Tobacco Counseling Counseling given: Not Answered    Clinical Intake:  Pre-visit preparation completed: Yes  Pain : No/denies pain     BMI - recorded: 32.58 Nutritional Status: BMI > 30  Obese Nutritional Risks: None Diabetes: No  Lab Results  Component Value Date   HGBA1C 5.2 08/06/2023   HGBA1C 5.0 10/09/2018   HGBA1C 5.2 03/15/2016     How often do you need to have someone help you when you read instructions, pamphlets, or other written materials from your doctor or pharmacy?: 1 - Never  Interpreter Needed?: No  Information entered by :: Verdie Saba, CMA   Activities of Daily Living     07/20/2024    3:47 PM 08/06/2023    2:01 PM  In your present state of health, do you have any difficulty performing the following activities:  Hearing? 0   Vision? 0   Difficulty concentrating or making decisions? 0   Walking or climbing stairs? 0   Dressing or bathing? 0   Doing errands, shopping? 0 0  Preparing  Food and eating ? N   Using the Toilet? N   In the past six months, have you accidently leaked urine? N   Do you have problems with loss of bowel control? N   Managing your Medications? N   Managing your Finances? N   Housekeeping or managing your Housekeeping? N     Patient Care Team: Joshua Debby CROME, MD as PCP - General  I have updated your Care Teams any recent Medical Services you may have received from other providers in the past year.     Assessment:   This is a routine wellness examination for [place].  Hearing/Vision screen Hearing Screening - Comments:: Denies hearing difficulties   Vision Screening - Comments:: Wears rx glasses - up to date with routine eye exams with [facility]   Goals Addressed               This Visit's Progress     Patient Stated (pt-stated)        Patient stated that he plans to continue to stay active and busy       Depression Screen     07/20/2024    3:49 PM 02/20/2024    3:30 PM 10/18/2022    3:41 PM 09/07/2021    3:11 PM 07/14/2020    8:08 AM 07/02/2019    8:53 AM 10/09/2018    3:46 PM  PHQ 2/9 Scores  PHQ - 2 Score 0 0 0 0 0 0 0  PHQ- 9 Score 0  0        Fall Risk     07/20/2024    3:48 PM 02/20/2024    3:29 PM 10/18/2022    3:41 PM 09/07/2021    3:11 PM 07/14/2020    8:08 AM  Fall Risk   Falls in the past year? 0 0 0 0 0  Number falls in past yr: 0 0 0 0 0  Injury with Fall? 0 0 0 0 0  Risk for fall due to : No Fall Risks No Fall Risks No Fall Risks  No Fall Risks  Follow up Falls evaluation completed;Falls prevention discussed Falls evaluation completed Falls evaluation completed   Falls evaluation completed      Data saved with a previous flowsheet row definition    MEDICARE RISK AT HOME:  Medicare Risk at Home Any stairs in or around the home?: Yes (inside/outside) If so, are there any without handrails?: No Home free of loose throw rugs in walkways, pet beds, electrical cords, etc?: Yes Adequate lighting  in your home to reduce risk of falls?: Yes Life alert?: No Use of a cane, walker or w/c?: No Grab bars in the bathroom?: No Shower chair or bench in shower?: Yes Elevated toilet seat or a handicapped toilet?: No  TIMED UP AND GO:  Was the test performed?  No  Cognitive Function: 6CIT completed        07/20/2024    3:51 PM  6CIT Screen  What Year? 0 points  What month? 0 points  What time? 0 points  Count back from 20 0 points  Months in reverse 4 points  Repeat phrase 0 points  Total Score 4 points    Immunizations Immunization History  Administered Date(s) Administered   DTaP 05/31/2005   Fluad Trivalent(High Dose 65+) 08/15/2023   Hep A / Hep B 02/14/2017, 09/20/2017   Hepatitis B, ADULT 03/17/2017   Influenza Split 09/26/2012   Influenza Whole 09/07/2013   Influenza,inj,quad, With Preservative 08/29/2017   Influenza-Unspecified 09/16/2017, 09/08/2021   PFIZER(Purple Top)SARS-COV-2 Vaccination 04/24/2020, 05/10/2020   PNEUMOCOCCAL CONJUGATE-20 05/05/2021   Pneumococcal Polysaccharide-23 03/15/2016   Tdap 10/05/2011, 05/05/2021   Zoster Recombinant(Shingrix ) 03/08/2022, 07/12/2022    Screening Tests Health Maintenance  Topic Date Due   COVID-19 Vaccine (3 - 2024-25 season) 07/31/2023   INFLUENZA VACCINE  06/29/2024   Medicare Annual Wellness (AWV)  07/20/2025   Colonoscopy  03/27/2028   DTaP/Tdap/Td (4 - Td or Tdap) 05/06/2031   Pneumococcal Vaccine: 50+ Years  Completed   Hepatitis C Screening  Completed   Zoster Vaccines- Shingrix   Completed   HPV VACCINES  Aged Out   Meningococcal B Vaccine  Aged Out   Hepatitis B Vaccines 19-59 Average Risk  Discontinued   Fecal DNA (Cologuard)  Discontinued    Health Maintenance  Health Maintenance Due  Topic Date Due   COVID-19 Vaccine (3 - 2024-25 season) 07/31/2023   INFLUENZA VACCINE  06/29/2024   Health Maintenance Items Addressed: 07/20/2024  Additional Screening:  Vision Screening: Recommended annual  ophthalmology exams for early detection of glaucoma and other disorders of the eye. Would you like a referral to an eye doctor? No  Patient has had an eye exam w/[facility].  Dental Screening: Recommended annual dental exams for proper oral hygiene  Community Resource Referral / Chronic Care Management: CRR required this visit?  No   CCM required this visit?  No   Plan:    I have personally reviewed and noted the following in the patient's chart:   Medical and social history Use of alcohol , tobacco or illicit drugs  Current medications and supplements including opioid prescriptions. Patient is not currently taking opioid prescriptions. Functional ability and status Nutritional status Physical activity Advanced directives List of other physicians Hospitalizations, surgeries, and ER visits in previous 12 months Vitals Screenings to include cognitive, depression, and falls Referrals and appointments  In addition, I have reviewed and discussed with patient certain preventive protocols, quality  metrics, and best practice recommendations. A written personalized care plan for preventive services as well as general preventive health recommendations were provided to patient.   Verdie CHRISTELLA Saba, CMA   07/20/2024   After Visit Summary: (MyChart) Due to this being a telephonic visit, the after visit summary with patients personalized plan was offered to patient via MyChart   Notes: Nothing significant to report at this time.

## 2024-07-20 NOTE — Patient Instructions (Signed)
 Kenneth Singleton , Thank you for taking time out of your busy schedule to complete your Annual Wellness Visit with me. I enjoyed our conversation and look forward to speaking with you again next year. I, as well as your care team,  appreciate your ongoing commitment to your health goals. Please review the following plan we discussed and let me know if I can assist you in the future. Your Game plan/ To Do List    Referrals: If you haven't heard from the office you've been referred to, please reach out to them at the phone provided.   Follow up Visits: We will see or speak with you next year for your Next Medicare AWV with our clinical staff Have you seen your provider in the last 6 months (3 months if uncontrolled diabetes)? No  Clinician Recommendations:  Aim for 30 minutes of exercise or brisk walking, 6-8 glasses of water , and 5 servings of fruits and vegetables each day.       This is a list of the screenings recommended for you:  Health Maintenance  Topic Date Due   COVID-19 Vaccine (3 - 2024-25 season) 07/31/2023   Flu Shot  06/29/2024   Medicare Annual Wellness Visit  07/20/2025   Colon Cancer Screening  03/27/2028   DTaP/Tdap/Td vaccine (4 - Td or Tdap) 05/06/2031   Pneumococcal Vaccine for age over 24  Completed   Hepatitis C Screening  Completed   Zoster (Shingles) Vaccine  Completed   HPV Vaccine  Aged Out   Meningitis B Vaccine  Aged Out   Hepatitis B Vaccine  Discontinued   Cologuard (Stool DNA test)  Discontinued    Advanced directives: (Provided) Advance directive discussed with you today. I have provided a copy for you to complete at home and have notarized. Once this is complete, please bring a copy in to our office so we can scan it into your chart.  Advance Care Planning is important because it:  [x]  Makes sure you receive the medical care that is consistent with your values, goals, and preferences  [x]  It provides guidance to your family and loved ones and reduces their  decisional burden about whether or not they are making the right decisions based on your wishes.  Follow the link provided in your after visit summary or read over the paperwork we have mailed to you to help you started getting your Advance Directives in place. If you need assistance in completing these, please reach out to us  so that we can help you!

## 2024-08-23 ENCOUNTER — Ambulatory Visit: Admitting: Internal Medicine

## 2024-08-23 ENCOUNTER — Other Ambulatory Visit: Payer: Self-pay | Admitting: Internal Medicine

## 2024-08-23 ENCOUNTER — Encounter: Payer: Self-pay | Admitting: Internal Medicine

## 2024-08-23 VITALS — BP 130/80 | HR 73 | Temp 98.5°F | Resp 16 | Ht 67.0 in | Wt 200.2 lb

## 2024-08-23 DIAGNOSIS — E781 Pure hyperglyceridemia: Secondary | ICD-10-CM

## 2024-08-23 DIAGNOSIS — K7010 Alcoholic hepatitis without ascites: Secondary | ICD-10-CM | POA: Diagnosis not present

## 2024-08-23 DIAGNOSIS — M150 Primary generalized (osteo)arthritis: Secondary | ICD-10-CM

## 2024-08-23 DIAGNOSIS — J4489 Other specified chronic obstructive pulmonary disease: Secondary | ICD-10-CM

## 2024-08-23 DIAGNOSIS — E785 Hyperlipidemia, unspecified: Secondary | ICD-10-CM

## 2024-08-23 DIAGNOSIS — Z23 Encounter for immunization: Secondary | ICD-10-CM

## 2024-08-23 DIAGNOSIS — G8929 Other chronic pain: Secondary | ICD-10-CM

## 2024-08-23 DIAGNOSIS — K7581 Nonalcoholic steatohepatitis (NASH): Secondary | ICD-10-CM

## 2024-08-23 DIAGNOSIS — I10 Essential (primary) hypertension: Secondary | ICD-10-CM | POA: Diagnosis not present

## 2024-08-23 LAB — CBC WITH DIFFERENTIAL/PLATELET
Basophils Absolute: 0 K/uL (ref 0.0–0.1)
Basophils Relative: 0.7 % (ref 0.0–3.0)
Eosinophils Absolute: 0.1 K/uL (ref 0.0–0.7)
Eosinophils Relative: 1.5 % (ref 0.0–5.0)
HCT: 41.9 % (ref 39.0–52.0)
Hemoglobin: 14.5 g/dL (ref 13.0–17.0)
Lymphocytes Relative: 15.5 % (ref 12.0–46.0)
Lymphs Abs: 1.1 K/uL (ref 0.7–4.0)
MCHC: 34.6 g/dL (ref 30.0–36.0)
MCV: 96.7 fl (ref 78.0–100.0)
Monocytes Absolute: 0.7 K/uL (ref 0.1–1.0)
Monocytes Relative: 9.8 % (ref 3.0–12.0)
Neutro Abs: 5.1 K/uL (ref 1.4–7.7)
Neutrophils Relative %: 72.5 % (ref 43.0–77.0)
Platelets: 296 K/uL (ref 150.0–400.0)
RBC: 4.33 Mil/uL (ref 4.22–5.81)
RDW: 12 % (ref 11.5–15.5)
WBC: 7.1 K/uL (ref 4.0–10.5)

## 2024-08-23 LAB — LIPID PANEL
Cholesterol: 150 mg/dL (ref 0–200)
HDL: 46.1 mg/dL (ref >39.00)
LDL Cholesterol: 85 mg/dL (ref 0–99)
NonHDL: 104.25
Total CHOL/HDL Ratio: 3
Triglycerides: 97 mg/dL (ref 0.0–149.0)
VLDL: 19.4 mg/dL (ref 0.0–40.0)

## 2024-08-23 LAB — BASIC METABOLIC PANEL WITH GFR
BUN: 14 mg/dL (ref 6–23)
CO2: 27 meq/L (ref 19–32)
Calcium: 9.7 mg/dL (ref 8.4–10.5)
Chloride: 101 meq/L (ref 96–112)
Creatinine, Ser: 0.86 mg/dL (ref 0.40–1.50)
GFR: 89.57 mL/min (ref >60.00)
Glucose, Bld: 94 mg/dL (ref 70–99)
Potassium: 4.5 meq/L (ref 3.5–5.1)
Sodium: 136 meq/L (ref 135–145)

## 2024-08-23 LAB — HEPATIC FUNCTION PANEL
ALT: 33 U/L (ref 0–53)
AST: 31 U/L (ref 0–37)
Albumin: 4.2 g/dL (ref 3.5–5.2)
Alkaline Phosphatase: 84 U/L (ref 39–117)
Bilirubin, Direct: 0.2 mg/dL (ref 0.0–0.3)
Total Bilirubin: 0.7 mg/dL (ref 0.2–1.2)
Total Protein: 7.4 g/dL (ref 6.0–8.3)

## 2024-08-23 LAB — CK: Total CK: 50 U/L (ref 17–232)

## 2024-08-23 LAB — PROTIME-INR
INR: 1.2 ratio — ABNORMAL HIGH (ref 0.8–1.0)
Prothrombin Time: 12.9 s (ref 9.6–13.1)

## 2024-08-23 MED ORDER — PRAVASTATIN SODIUM 80 MG PO TABS: 80.0000 mg | ORAL_TABLET | Freq: Every day | ORAL | 0 refills | Status: DC

## 2024-08-23 MED ORDER — TRAMADOL HCL 50 MG PO TABS: 50.0000 mg | ORAL_TABLET | Freq: Three times a day (TID) | ORAL | 0 refills | Status: AC | PRN

## 2024-08-23 MED ORDER — NALTREXONE HCL 50 MG PO TABS: 50.0000 mg | ORAL_TABLET | Freq: Every day | ORAL | 0 refills | Status: DC

## 2024-08-23 MED ORDER — VITAMIN B-1 50 MG PO TABS: 50.0000 mg | ORAL_TABLET | Freq: Every day | ORAL | 0 refills | Status: DC

## 2024-08-23 MED ORDER — AMLODIPINE BESYLATE 5 MG PO TABS: 5.0000 mg | ORAL_TABLET | Freq: Every day | ORAL | 0 refills | Status: DC

## 2024-08-23 MED ORDER — BREZTRI AEROSPHERE 160-9-4.8 MCG/ACT IN AERO: 2.0000 | INHALATION_SPRAY | Freq: Two times a day (BID) | RESPIRATORY_TRACT | 1 refills | Status: AC

## 2024-08-23 NOTE — Progress Notes (Signed)
 Subjective:  Patient ID: Kenneth JULIANNA Hua Sr., male    DOB: 1956-11-26  Age: 68 y.o. MRN: [id number]  CC: Hypertension, Osteoarthritis, Hyperlipidemia, and COPD   HPI Kenneth San[facility] Sr. presents for f/up ---  Discussed the use of AI scribe software for clinical note transcription with the patient, who gave verbal consent to proceed.  History of Present Illness Kenneth KLAIBER Sr. is a 68 year old male with arthritis who presents with joint pain management.  He experiences joint pain, particularly in his hands and knees, which worsens in cold, wet, and damp weather. The pain sometimes prevents him from closing his hands. He has been using Tylenol  for pain management but stopped due to concerns about liver health. He is seeking alternative pain management options for his arthritis.  He has a history of liver disease but denies current symptoms such as abdominal pain, nausea, vomiting, jaundice, or fatigue.  He is a drinker but has cut back and has not consumed alcohol  in about two weeks. He acknowledges occasional overconsumption in the past but states it has been a while since he last felt that way.  No chest pain, shortness of breath, dizziness, or lightheadedness during physical activity. He feels sore the day after being active, depending on the activity performed.     Outpatient Medications Prior to Visit  Medication Sig Dispense Refill   acetaminophen  (TYLENOL ) 500 MG tablet Take 2 tablets (1,000 mg total) by mouth every 6 (six) hours as needed.     celecoxib  (CELEBREX ) 200 MG capsule TAKE 1 CAPSULE(200 MG) BY MOUTH DAILY 90 capsule 1   gabapentin  (NEURONTIN ) 100 MG capsule TAKE 1 CAPSULE BY MOUTH THREE TIMES A DAY 270 capsule 0   levocetirizine (XYZAL ) 5 MG tablet TAKE 1 TABLET BY MOUTH EVERY DAY IN THE EVENING 90 tablet 1   Multiple Vitamins-Minerals (CENTRUM SILVER PO) Take 1 tablet by mouth daily.     Multiple Vitamins-Minerals (EYE VITAMINS PO) Take 2 capsules by mouth daily. EyePromise  brand vitamins     omeprazole (PRILOSEC) 20 MG capsule Take 20 mg by mouth daily.     amLODipine  (NORVASC ) 5 MG tablet TAKE 1 TABLET BY MOUTH DAILY 90 tablet 0   budeson-glycopyrrolate -formoterol (BREZTRI  AEROSPHERE) 160-9-4.8 MCG/ACT AERO Inhale 2 puffs into the lungs 2 (two) times daily. 32.1 g 1   pravastatin  (PRAVACHOL ) 80 MG tablet TAKE 1 TABLET(80 MG) BY MOUTH DAILY 90 tablet 0   No facility-administered medications prior to visit.    ROS Review of Systems  Constitutional:  Negative for appetite change, chills, diaphoresis and fatigue.  HENT: Negative.    Eyes: Negative.   Respiratory:  Positive for apnea. Negative for cough, chest tightness, shortness of breath and wheezing.   Cardiovascular:  Negative for chest pain, palpitations and leg swelling.  Gastrointestinal: Negative.  Negative for abdominal pain, constipation, diarrhea, nausea and vomiting.  Endocrine: Negative.   Genitourinary: Negative.  Negative for difficulty urinating and dysuria.  Musculoskeletal:  Positive for arthralgias. Negative for back pain and myalgias.  Neurological:  Negative for dizziness and weakness.  Hematological:  Negative for adenopathy. Does not bruise/bleed easily.  Psychiatric/Behavioral: Negative.      Objective:  BP 130/80 (BP Location: Left Arm, Patient Position: Sitting, Cuff Size: Normal)   Pulse 73   Temp 98.5 F (36.9 C) (Oral)   Resp 16   Ht 5' 7 (1.702 m)   Wt 200 lb 3.2 oz (90.8 kg)   SpO2 97%   BMI 31.36  kg/m   BP Readings from Last 3 Encounters:  08/23/24 130/80  02/20/24 132/78  08/15/23 136/70    Wt Readings from Last 3 Encounters:  08/23/24 200 lb 3.2 oz (90.8 kg)  07/20/24 208 lb (94.3 kg)  02/20/24 208 lb 6.4 oz (94.5 kg)    Physical Exam Vitals reviewed.  Constitutional:      General: He is not in acute distress.    Appearance: He is not ill-appearing, toxic-appearing or diaphoretic.  HENT:     Nose: Nose normal.     Mouth/Throat:     Mouth: Mucous  membranes are moist.  Eyes:     General: No scleral icterus.    Conjunctiva/sclera: Conjunctivae normal.  Cardiovascular:     Rate and Rhythm: Normal rate and regular rhythm.     Heart sounds: No murmur heard.    No friction rub. No gallop.  Pulmonary:     Effort: Pulmonary effort is normal.     Breath sounds: No stridor. No wheezing, rhonchi or rales.  Abdominal:     General: Abdomen is flat.     Palpations: There is no mass.     Tenderness: There is no abdominal tenderness. There is no guarding.     Hernia: No hernia is present.  Musculoskeletal:        General: Deformity (DJD) present. No tenderness.     Cervical back: Neck supple.     Right lower leg: No edema.     Left lower leg: No edema.  Lymphadenopathy:     Cervical: No cervical adenopathy.  Skin:    General: Skin is warm and dry.  Neurological:     General: No focal deficit present.     Mental Status: He is alert. Mental status is at baseline.  Psychiatric:        Mood and Affect: Mood normal.        Behavior: Behavior normal.     Lab Results  Component Value Date   WBC 7.1 08/23/2024   HGB 14.5 08/23/2024   HCT 41.9 08/23/2024   PLT 296.0 08/23/2024   GLUCOSE 94 08/23/2024   CHOL 150 08/23/2024   TRIG 97.0 08/23/2024   HDL 46.10 08/23/2024   LDLDIRECT 105.0 07/02/2019   LDLCALC 85 08/23/2024   ALT 33 08/23/2024   AST 31 08/23/2024   NA 136 08/23/2024   K 4.5 08/23/2024   CL 101 08/23/2024   CREATININE 0.86 08/23/2024   BUN 14 08/23/2024   CO2 27 08/23/2024   TSH 2.06 02/20/2024   PSA 0.43 02/20/2024   INR 1.2 (H) 08/23/2024   HGBA1C 5.2 08/06/2023    US  Abdomen Limited RUQ (LIVER/GB) Result Date: 08/06/2023 CLINICAL DATA:  68 year old male with history of epigastric pain. EXAM: ULTRASOUND ABDOMEN LIMITED RIGHT UPPER QUADRANT COMPARISON:  Right upper quadrant abdominal ultrasound 07/13/2019. FINDINGS: Gallbladder: No gallstones or wall thickening visualized. No sonographic Murphy sign noted by  sonographer. Common bile duct: Diameter: 5.6 mm Liver: No focal lesion identified. Liver is diffusely heterogeneously echogenic, suggesting a background of cirrhosis. There is also a slight irregular contour of the liver, suggesting underlying cirrhosis. Portal vein is patent on color Doppler imaging with normal direction of blood flow towards the liver. Other: None. IMPRESSION: 1. No acute findings. Specifically, no gallstones and no evidence of acute cholecystitis. 2. Hepatic steatosis. 3. Nodular contour of the liver suggesting early changes of cirrhosis. Electronically Signed   By: Toribio Aye M.D.   On: 08/06/2023 07:59   CT  Angio Chest/Abd/Pel for Dissection W and/or Wo Contrast Result Date: 08/06/2023 CLINICAL DATA:  68 year old male with history of substernal chest pain radiating into the mid back and down to the umbilical region for 1 day. Clinical suspicion for acute aortic syndrome. EXAM: CT ANGIOGRAPHY CHEST, ABDOMEN AND PELVIS TECHNIQUE: Non-contrast CT of the chest was initially obtained. Multidetector CT imaging through the chest, abdomen and pelvis was performed using the standard protocol during bolus administration of intravenous contrast. Multiplanar reconstructed images and MIPs were obtained and reviewed to evaluate the vascular anatomy. RADIATION DOSE REDUCTION: This exam was performed according to the departmental dose-optimization program which includes automated exposure control, adjustment of the mA and/or kV according to patient size and/or use of iterative reconstruction technique. CONTRAST:  OMNIPAQUE  IOHEXOL  350 MG/ML SOLN COMPARISON:  No priors. FINDINGS: CTA CHEST FINDINGS Cardiovascular: Atherosclerotic calcifications are noted throughout the thoracic aorta. No definite coronary artery calcifications are noted. Precontrast images demonstrate no crescentic high attenuation associated with the wall of the thoracic aorta to indicate the presence of acute intramural  hemorrhage. Postcontrast images demonstrate no aneurysm or dissection of the thoracic aorta. Ascending thoracic aorta, mid arch and descending thoracic aorta are normal in caliber measuring 3.0 cm, 2.5 cm and 2.5 cm in diameter respectively. Heart size is normal. There is no significant pericardial fluid, thickening or pericardial calcification. Mediastinum/Nodes: No pathologically enlarged mediastinal or hilar lymph nodes. Esophagus is unremarkable in appearance. No axillary lymphadenopathy. Lungs/Pleura: No acute consolidative airspace disease. No pleural effusions. No definite suspicious appearing pulmonary nodules or masses are noted. Mild diffuse bronchial wall thickening with mild centrilobular and paraseptal emphysema. Musculoskeletal: There are no aggressive appearing lytic or blastic lesions noted in the visualized portions of the skeleton. Review of the MIP images confirms the above findings. CTA ABDOMEN AND PELVIS FINDINGS VASCULAR Aorta: Aortic atherosclerosis. Normal caliber aorta without aneurysm, dissection, vasculitis or significant stenosis. Celiac: Patent without evidence of aneurysm, dissection, vasculitis or significant stenosis. SMA: Patent without evidence of aneurysm, dissection, vasculitis or significant stenosis. Renals: Both renal arteries are patent without evidence of aneurysm, dissection, vasculitis, fibromuscular dysplasia or significant stenosis. IMA: Patent without evidence of aneurysm, dissection, vasculitis or significant stenosis. Inflow: Patent without evidence of aneurysm, dissection, vasculitis or significant stenosis. Veins: No obvious venous abnormality within the limitations of this arterial phase study. Review of the MIP images confirms the above findings. NON-VASCULAR Hepatobiliary: No definite suspicious cystic or solid hepatic lesions are noted on today's arterial phase examination. There is diffuse low attenuation throughout the hepatic parenchyma, indicative of a  background of hepatic steatosis. No intra or extrahepatic biliary ductal dilatation. Gallbladder is moderately distended. There is a suggestion of some stones and/or sludge lying dependently in the gallbladder. Trace amount of soft tissue stranding surrounding the gallbladder. Pancreas: No pancreatic mass. No pancreatic ductal dilatation. No pancreatic or peripancreatic fluid collections or inflammatory changes. Spleen: Unremarkable. Adrenals/Urinary Tract: Bilateral kidneys and bilateral adrenal glands are normal in appearance. No hydroureteronephrosis. Urinary bladder is moderately distended, but otherwise unremarkable in appearance. Stomach/Bowel: The appearance of the stomach is normal. Subtle soft tissue stranding is noted adjacent to the second portion of the duodenum. No pathologic dilatation of small bowel or colon. Normal appendix. Lymphatic: No lymphadenopathy noted in the abdomen or pelvis. Reproductive: Prostate gland and seminal vesicles are unremarkable in appearance. Other: No significant volume of ascites. No pneumoperitoneum. Small umbilical hernia containing only omental fat. Musculoskeletal: There are no aggressive appearing lytic or blastic lesions noted in the visualized portions  of the skeleton. Review of the MIP images confirms the above findings. IMPRESSION: 1. While there is aortic atherosclerosis, there are no findings to suggest acute aortic syndrome. 2. Gallbladder is moderately distended with a suggestion of some dependent stones and/or biliary sludge, as well as a trace amount of surrounding soft tissue stranding. These findings could indicate an acute cholecystitis. Further clinical evaluation is recommended. Consideration for follow-up right upper quadrant abdominal ultrasound is recommended if clinically appropriate. 3. Subtle inflammatory changes also noted adjacent to the second portion of the duodenal. If there is no evidence of acute cholecystitis, clinical correlation for signs  and symptoms of potential duodenitis is suggested. 4. Hepatic steatosis. 5. Tiny umbilical hernia containing only omental fat. No associated bowel incarceration or obstruction at this time. 6. Mild diffuse bronchial wall thickening with mild centrilobular and paraseptal emphysema; imaging findings suggestive of underlying COPD. Electronically Signed   By: Toribio Aye M.D.   On: 08/06/2023 05:38   DG Chest 2 View Result Date: 08/06/2023 CLINICAL DATA:  Chest pain. EXAM: CHEST - 2 VIEW COMPARISON:  Chest x-ray 03/12/2013. FINDINGS: The heart size and mediastinal contours are within normal limits. Both lungs are clear. The visualized skeletal structures are unremarkable. IMPRESSION: No active cardiopulmonary disease. Electronically Signed   By: Greig Pique M.D.   On: 08/06/2023 01:29   Fibrosis 4 Score = 1.22  Fib-4 interpretation is not validated for people under 35 or over 35 years of age. However, scores under 2.0 are generally considered low risk.   Assessment & Plan:  Need for immunization against influenza -     Flu vaccine HIGH DOSE PF(Fluzone Trivalent)  COPD (chronic obstructive pulmonary disease) with chronic bronchitis (HCC) -     Breztri  Aerosphere; Inhale 2 puffs into the lungs 2 (two) times daily.  Dispense: 32.1 g; Refill: 1 -     AMB Referral VBCI Care Management  Primary osteoarthritis involving multiple joints -     traMADol  HCl; Take 1 tablet (50 mg total) by mouth every 8 (eight) hours as needed for up to 5 days.  Dispense: 15 tablet; Refill: 0  Dyslipidemia, goal LDL below 130- LDL goal achieved. Doing well on the statin  -     Lipid panel; Future -     Pravastatin  Sodium; Take 1 tablet (80 mg total) by mouth daily.  Dispense: 90 tablet; Refill: 0 -     CK; Future -     AMB Referral VBCI Care Management  Hypertriglyceridemia, essential -     Lipid panel; Future -     CK; Future  Essential hypertension- BP is well controlled. -     Basic metabolic panel with GFR;  Future -     CBC with Differential/Platelet; Future -     amLODIPine  Besylate; Take 1 tablet (5 mg total) by mouth daily.  Dispense: 90 tablet; Refill: 0  Nonalcoholic steatohepatitis (NASH)     Follow-up: Return in about 4 months (around 12/23/2024).  Debby Molt, MD

## 2024-08-23 NOTE — Patient Instructions (Signed)

## 2024-08-25 ENCOUNTER — Other Ambulatory Visit: Payer: Self-pay | Admitting: Internal Medicine

## 2024-08-25 DIAGNOSIS — I10 Essential (primary) hypertension: Secondary | ICD-10-CM

## 2024-08-27 ENCOUNTER — Ambulatory Visit: Payer: Self-pay | Admitting: Internal Medicine

## 2024-08-28 ENCOUNTER — Other Ambulatory Visit: Payer: Self-pay | Admitting: Internal Medicine

## 2024-08-28 ENCOUNTER — Telehealth: Payer: Self-pay | Admitting: *Deleted

## 2024-08-28 DIAGNOSIS — G8929 Other chronic pain: Secondary | ICD-10-CM

## 2024-08-28 NOTE — Progress Notes (Unsigned)
 Complex Care Management Note Care Guide Note  08/28/2024 Name: Kenneth Singleton [facility] Sr. MRN: [id number] DOB: October 22, 1956   Complex Care Management Outreach Attempts: An unsuccessful telephone outreach was attempted today to offer the patient information about available complex care management services.  Follow Up Plan:  Additional outreach attempts will be made to offer the patient complex care management information and services.   Encounter Outcome:  No Answer  Thedford Franks, CMA [place]  [facility], [facility] Guide Direct Dial: [phone]  Fax: [phone] Website: [email or web address]

## 2024-08-29 NOTE — Progress Notes (Unsigned)
 Complex Care Management Note Care Guide Note  08/29/2024 Name: Kenneth Singleton [facility] Sr. MRN: [id number] DOB: 11-25-1956   Complex Care Management Outreach Attempts: A second unsuccessful outreach was attempted today to offer the patient with information about available complex care management services.  Follow Up Plan:  Additional outreach attempts will be made to offer the patient complex care management information and services.   Encounter Outcome:  No Answer  Thedford Franks, CMA [place]  [facility], [facility] Guide Direct Dial: [phone]  Fax: [phone] Website: [email or web address]

## 2024-08-30 NOTE — Progress Notes (Signed)
 Complex Care Management Note Care Guide Note  08/30/2024 Name: Kenneth Singleton [facility] Sr. MRN: [id number] DOB: [birth year]   Complex Care Management Outreach Attempts: A third unsuccessful outreach was attempted today to offer the patient with information about available complex care management services.  Follow Up Plan:  No further outreach attempts will be made at this time. We have been unable to contact the patient to offer or enroll patient in complex care management services.  Encounter Outcome:  No Answer  Thedford Franks, CMA [place]  [facility], [facility] Guide Direct Dial: [phone]  Fax: [phone] Website: [email or web address]

## 2024-09-18 ENCOUNTER — Other Ambulatory Visit: Payer: Self-pay | Admitting: Internal Medicine

## 2024-09-18 DIAGNOSIS — E785 Hyperlipidemia, unspecified: Secondary | ICD-10-CM

## 2024-11-03 ENCOUNTER — Other Ambulatory Visit: Payer: Self-pay | Admitting: Internal Medicine

## 2024-11-03 DIAGNOSIS — T783XXA Angioneurotic edema, initial encounter: Secondary | ICD-10-CM

## 2024-11-03 DIAGNOSIS — L2084 Intrinsic (allergic) eczema: Secondary | ICD-10-CM

## 2024-11-26 ENCOUNTER — Other Ambulatory Visit: Payer: Self-pay | Admitting: Internal Medicine

## 2024-11-26 DIAGNOSIS — I10 Essential (primary) hypertension: Secondary | ICD-10-CM

## 2024-11-30 ENCOUNTER — Telehealth: Payer: Self-pay

## 2024-11-30 ENCOUNTER — Other Ambulatory Visit: Payer: Self-pay

## 2024-11-30 DIAGNOSIS — I10 Essential (primary) hypertension: Secondary | ICD-10-CM

## 2024-11-30 MED ORDER — AMLODIPINE BESYLATE 5 MG PO TABS: 5.0000 mg | ORAL_TABLET | Freq: Every day | ORAL | 1 refills | Status: AC

## 2024-11-30 NOTE — Telephone Encounter (Signed)
 Copied from CRM #[id number]. Topic: Clinical - Prescription Issue >> Nov 30, 2024  1:18 PM Robinson H wrote: Reason for CRM: Corean from [facility] following up on refill request faxed on 11/24/2024 to office, amLODipine  (NORVASC ) 5 MG tablet medication shows it was sent to the [facility] and not [facility].  Corean [phone] Pharmacy Line [phone]

## 2024-11-30 NOTE — Telephone Encounter (Signed)
Medication has been resent to the correct pharmacy

## 2024-12-03 ENCOUNTER — Other Ambulatory Visit: Payer: Self-pay | Admitting: Internal Medicine

## 2024-12-03 DIAGNOSIS — M150 Primary generalized (osteo)arthritis: Secondary | ICD-10-CM

## 2024-12-03 DIAGNOSIS — E785 Hyperlipidemia, unspecified: Secondary | ICD-10-CM

## 2024-12-03 DIAGNOSIS — L2084 Intrinsic (allergic) eczema: Secondary | ICD-10-CM

## 2024-12-03 DIAGNOSIS — T783XXA Angioneurotic edema, initial encounter: Secondary | ICD-10-CM

## 2024-12-03 DIAGNOSIS — G8929 Other chronic pain: Secondary | ICD-10-CM

## 2024-12-03 NOTE — Telephone Encounter (Unsigned)
 Copied from CRM #[id number]. Topic: Clinical - Medication Refill >> Dec 03, 2024  3:54 PM Alfonso ORN wrote: Medication:  gabapentin  (NEURONTIN ) 100 MG capsule levocetirizine (XYZAL ) 5 MG tablet celecoxib  (CELEBREX ) 200 MG capsule pravastatin  (PRAVACHOL ) 80 MG tablet  Has the patient contacted their pharmacy? Yes   This is the patient's preferred pharmacy:  [facility] - [place], [place] - [address] [place] [address] Phone: [phone] Fax: [phone]  Is this the correct pharmacy for this prescription? Yes If no, delete pharmacy and type the correct one.   Has the prescription been filled recently? Yes  Is the patient out of the medication? No  Has the patient been seen for an appointment in the last year OR does the patient have an upcoming appointment? Yes  Can we respond through MyChart? Yes

## 2024-12-04 ENCOUNTER — Telehealth: Payer: Self-pay

## 2024-12-04 NOTE — Telephone Encounter (Signed)
 Copied from CRM #[id number]. Topic: Clinical - Medication Refill >> Dec 03, 2024  3:54 PM Alfonso ORN wrote: Medication:  gabapentin  (NEURONTIN ) 100 MG capsule levocetirizine (XYZAL ) 5 MG tablet celecoxib  (CELEBREX ) 200 MG capsule pravastatin  (PRAVACHOL ) 80 MG tablet  Has the patient contacted their pharmacy? Yes   This is the patient's preferred pharmacy:  [facility] - [place], [place] - [address] [place] [address] Phone: [phone] Fax: [phone]  Is this the correct pharmacy for this prescription? Yes If no, delete pharmacy and type the correct one.   Has the prescription been filled recently? Yes  Is the patient out of the medication? No  Has the patient been seen for an appointment in the last year OR does the patient have an upcoming appointment? Yes  Can we respond through MyChart? Yes >> Dec 04, 2024  4:03 PM Thersia BROCKS wrote: Patient wife called in following up on medication , would like a callback

## 2024-12-04 NOTE — Telephone Encounter (Unsigned)
 Copied from CRM #[id number]. Topic: Clinical - Medication Refill >> Dec 03, 2024  3:54 PM Alfonso ORN wrote: Medication:  gabapentin  (NEURONTIN ) 100 MG capsule levocetirizine (XYZAL ) 5 MG tablet celecoxib  (CELEBREX ) 200 MG capsule pravastatin  (PRAVACHOL ) 80 MG tablet  Has the patient contacted their pharmacy? Yes   This is the patient's preferred pharmacy:  [facility] - [place], [place] - [address] [place] [address] Phone: [phone] Fax: [phone]  Is this the correct pharmacy for this prescription? Yes If no, delete pharmacy and type the correct one.   Has the prescription been filled recently? Yes  Is the patient out of the medication? No  Has the patient been seen for an appointment in the last year OR does the patient have an upcoming appointment? Yes  Can we respond through MyChart? Yes >> Dec 04, 2024  4:23 PM Winona SAUNDERS wrote: [facility] calling to check the status of med request  >> Dec 04, 2024  4:03 PM Thersia BROCKS wrote: Patient wife called in following up on medication , would like a callback

## 2024-12-05 MED ORDER — CELECOXIB 200 MG PO CAPS: ORAL_CAPSULE | ORAL | 1 refills | Status: AC

## 2024-12-05 MED ORDER — LEVOCETIRIZINE DIHYDROCHLORIDE 5 MG PO TABS: ORAL_TABLET | ORAL | 1 refills | Status: AC

## 2024-12-05 MED ORDER — GABAPENTIN 100 MG PO CAPS: 100.0000 mg | ORAL_CAPSULE | Freq: Three times a day (TID) | ORAL | 0 refills | Status: DC

## 2024-12-05 MED ORDER — PRAVASTATIN SODIUM 80 MG PO TABS: ORAL_TABLET | ORAL | 0 refills | Status: DC

## 2024-12-05 NOTE — Telephone Encounter (Signed)
 Medication has been refilled.

## 2024-12-06 ENCOUNTER — Other Ambulatory Visit: Payer: Self-pay | Admitting: Internal Medicine

## 2024-12-06 DIAGNOSIS — G8929 Other chronic pain: Secondary | ICD-10-CM

## 2024-12-14 ENCOUNTER — Other Ambulatory Visit: Payer: Self-pay | Admitting: Internal Medicine

## 2024-12-14 DIAGNOSIS — G8929 Other chronic pain: Secondary | ICD-10-CM

## 2024-12-22 ENCOUNTER — Other Ambulatory Visit: Payer: Self-pay | Admitting: Internal Medicine

## 2024-12-22 DIAGNOSIS — E785 Hyperlipidemia, unspecified: Secondary | ICD-10-CM

## 2024-12-24 ENCOUNTER — Ambulatory Visit: Admitting: Internal Medicine

## 2024-12-28 ENCOUNTER — Encounter: Payer: Self-pay | Admitting: Internal Medicine

## 2024-12-28 ENCOUNTER — Ambulatory Visit (INDEPENDENT_AMBULATORY_CARE_PROVIDER_SITE_OTHER)

## 2024-12-28 ENCOUNTER — Ambulatory Visit: Admitting: Internal Medicine

## 2024-12-28 VITALS — BP 140/78 | HR 75 | Temp 98.0°F | Resp 16 | Ht 67.0 in | Wt 198.2 lb

## 2024-12-28 DIAGNOSIS — M1711 Unilateral primary osteoarthritis, right knee: Secondary | ICD-10-CM

## 2024-12-28 DIAGNOSIS — G8929 Other chronic pain: Secondary | ICD-10-CM

## 2024-12-28 DIAGNOSIS — M546 Pain in thoracic spine: Secondary | ICD-10-CM | POA: Diagnosis not present

## 2024-12-28 DIAGNOSIS — K7030 Alcoholic cirrhosis of liver without ascites: Secondary | ICD-10-CM | POA: Insufficient documentation

## 2024-12-28 DIAGNOSIS — I10 Essential (primary) hypertension: Secondary | ICD-10-CM

## 2024-12-28 DIAGNOSIS — M19012 Primary osteoarthritis, left shoulder: Secondary | ICD-10-CM

## 2024-12-28 DIAGNOSIS — N400 Enlarged prostate without lower urinary tract symptoms: Secondary | ICD-10-CM

## 2024-12-28 LAB — HEPATIC FUNCTION PANEL
ALT: 14 U/L (ref 3–53)
AST: 15 U/L (ref 5–37)
Albumin: 4.3 g/dL (ref 3.5–5.2)
Alkaline Phosphatase: 73 U/L (ref 39–117)
Bilirubin, Direct: 0.1 mg/dL (ref 0.1–0.3)
Total Bilirubin: 0.4 mg/dL (ref 0.2–1.2)
Total Protein: 7 g/dL (ref 6.0–8.3)

## 2024-12-28 LAB — BASIC METABOLIC PANEL WITH GFR
BUN: 17 mg/dL (ref 6–23)
CO2: 29 meq/L (ref 19–32)
Calcium: 10 mg/dL (ref 8.4–10.5)
Chloride: 102 meq/L (ref 96–112)
Creatinine, Ser: 0.84 mg/dL (ref 0.40–1.50)
GFR: 89.99 mL/min
Glucose, Bld: 98 mg/dL (ref 70–99)
Potassium: 4.6 meq/L (ref 3.5–5.1)
Sodium: 139 meq/L (ref 135–145)

## 2024-12-28 LAB — PROTIME-INR
INR: 1.2 ratio — ABNORMAL HIGH (ref 0.8–1.0)
Prothrombin Time: 13.5 s — ABNORMAL HIGH (ref 9.6–13.1)

## 2024-12-28 LAB — PSA: PSA: 0.37 ng/mL (ref 0.10–4.00)

## 2024-12-28 NOTE — Patient Instructions (Signed)
 High Blood Pressure (Hypertension) in Adults: What to Know High blood pressure is when the force of blood pumping through your arteries is too strong. This is also called hypertension. Arteries are blood vessels that carry blood from your heart to your body. If you have high blood pressure, your heart has to work harder to pump blood. This may cause the arteries to get narrow or stiff. High blood pressure can lead to a heart attack, heart failure, a stroke, or even kidney disease. What are the causes? In some cases, the cause isn't known. But there are many health problems that can cause high blood pressure. What increases the risk? You may be more likely to have high blood pressure if: You're older. You have a history of: Heart disease. High cholesterol. Kidney disease. You have a lot of stress in your life. Someone in your family has high blood pressure. You have sleep apnea. You're overweight. You may also be more likely to have high blood pressure if: You smoke. You don't get enough exercise. You eat and drink too much fat, sugar, calories, or salt (sodium). You drink too much alcohol. What are the signs or symptoms? In some cases, you may not have symptoms. But the higher your blood pressure, the more likely you are to have symptoms. These include: Headaches. Fast or uneven heartbeats. Feeling dizzy. Changes to your eyesight. Nosebleeds. Chest pain and feeling short of breath. Throwing up or feeling like you may throw up. How is this diagnosed? You may be diagnosed based on your health history, symptoms, and blood pressure reading. Healthy blood pressure for most adults is <120/80. The first number is the highest pressure reached in your arteries when your heart beats. The second number is the lowest pressure in your arteries when your heart rests between beats. If your blood pressure is above 130/80, you may be told you have high blood pressure. If you have a high blood  pressure reading during a visit, you may be asked to: Come back a different day to have your blood pressure checked again. Check your blood pressure at home for 1 week or longer. If you have high blood pressure, you may have blood or imaging tests done. How is this treated? You may need to: Change your diet. Take medicines to lower your blood pressure. Exercise more. Your target blood pressure will depend on your age and how healthy you are. Follow these instructions at home: Medicines Take your medicines only as told. Do not skip doses of blood pressure medicine. Tell your provider if you have any side effects from the medicine. Eating and drinking  Eat things that are good for your heart. You may want to try the Dietary Approaches to Stop Hypertension (DASH) diet. To eat this way: Eat lots of fresh fruits and vegetables. Try to fill half of your plate with fruits and vegetables. Fill about a fourth of your plate with whole grains. Whole grains include: Whole-wheat pasta. Brown rice. Whole-grain bread. Eat or drink low-fat dairy products, such as low-fat yogurt or skim milk. Avoid fatty cuts of meat, lunch meats, and poultry with skin. Fill about a fourth of your plate with low-fat (lean) proteins, such as: Fish. Chicken without skin. Beans. Eggs. Tofu. Avoid processed foods. These tend to be higher in salt, added sugar, and fat. Try to eat less than 1,500 mg of salt a day. Do not drink alcohol if: Your health care provider tells you not to drink. You're pregnant, may be pregnant,  or plan to become pregnant. If you drink alcohol: Limit how much you have to: 0-1 drink a day if you're male. 0-2 drinks a day if you're male. Know how much alcohol is in your drink. In the [place] one drink is one 12 oz bottle of beer (355 mL), one 5 oz glass of wine (148 mL), or one 1 oz glass of hard liquor (44 mL). Lifestyle  Stay at a healthy body weight. Ask what a good weight is for  you. Try to get 90-150 minutes per week of exercise that causes your heart to beat faster. This is called aerobic exercise. This may include: Walking. Swimming. Biking. Include exercises to strengthen your muscles for 30 minutes at least 3 days a week, such as: Pilates. Lifting weights. General instructions Do not smoke, vape, or use nicotine or tobacco. Check your blood pressure at home as told. Keep all follow-up visits. Your provider will want to check that your blood pressure is coming down. Contact a health care provider if: You have headaches. You feel dizzy. You have eyesight changes. Get help right away if: You have chest, back, or belly pain. You have trouble breathing. You faint. You have any signs of a stroke. BE FAST is an easy way to remember the main warning signs: B - Balance. Feeling dizzy, sudden trouble walking, or loss of balance. E - Eyes. Trouble seeing or a change in how you see. F - Face. Sudden weakness or feeling numb in the face. The face or eyelid may droop on one side. A - Arms. Weakness or loss of feeling in an arm. This happens fast and often only on one side. S - Speech. Sudden trouble speaking, slurred speech, or trouble understanding what people say. T - Time. Time to call 911. Write down what time symptoms started. Other signs of a stroke can be: A sudden, very bad headache with no known cause. Feeling like you may throw up. Throwing up. These symptoms may be an emergency. Call 911 right away. Do not wait to see if the symptoms will go away. Do not drive yourself to the hospital. This information is not intended to replace advice given to you by your health care provider. Make sure you discuss any questions you have with your health care provider. Document Revised: 04/02/2024 Document Reviewed: 03/28/2024 Elsevier Patient Education  2025 [employer].

## 2024-12-28 NOTE — Progress Notes (Unsigned)
 "  Subjective:  Patient ID: Kenneth JULIANNA Hua Sr., male    DOB: [birth year]  Age: 69 y.o. MRN: [id number]  CC: Back Pain and Hypertension   HPI [facility] presents for f/up ---  Discussed the use of AI scribe software for clinical note transcription with the patient, who gave verbal consent to proceed.  History of Present Illness Kenneth SULKOWSKI Sr. is a 69 year old male with arthritis who presents with back pain.  He has been experiencing intermittent pain in the center of his back, located between his shoulder blades, for about a week. He is unsure of the cause but speculates it could be related to his bed. He denies any recent injury.  He has a history of arthritis, which continues to affect his activity levels. He was previously prescribed tramadol , which he has finished, and is currently taking gabapentin  for pain management.  No chest pain, shortness of breath, coughing, wheezing, or urinary issues.     Outpatient Medications Prior to Visit  Medication Sig Dispense Refill   acetaminophen  (TYLENOL ) 500 MG tablet Take 2 tablets (1,000 mg total) by mouth every 6 (six) hours as needed.     amLODipine  (NORVASC ) 5 MG tablet Take 1 tablet (5 mg total) by mouth daily. 90 tablet 1   budesonide-glycopyrrolate -formoterol (BREZTRI  AEROSPHERE) 160-9-4.8 MCG/ACT AERO inhaler Inhale 2 puffs into the lungs 2 (two) times daily. 32.1 g 1   celecoxib  (CELEBREX ) 200 MG capsule TAKE 1 CAPSULE(200 MG) BY MOUTH DAILY 90 capsule 1   gabapentin  (NEURONTIN ) 100 MG capsule TAKE 1 CAPSULE BY MOUTH THREE TIMES DAILY 270 capsule 0   levocetirizine (XYZAL ) 5 MG tablet TAKE 1 TABLET BY MOUTH EVERY DAY IN THE EVENING 90 tablet 1   Multiple Vitamins-Minerals (CENTRUM SILVER PO) Take 1 tablet by mouth daily.     Multiple Vitamins-Minerals (EYE VITAMINS PO) Take 2 capsules by mouth daily. [employer] brand vitamins     omeprazole (PRILOSEC) 20 MG capsule Take 20 mg by mouth daily.     pravastatin  (PRAVACHOL ) 80 MG tablet  TAKE 1 TABLET(80 MG) BY MOUTH DAILY 90 tablet 0   thiamine  (VITAMIN B-1) 50 MG tablet Take 1 tablet (50 mg total) by mouth daily. 90 tablet 0   No facility-administered medications prior to visit.    ROS Review of Systems  Objective:  BP (!) 140/78 (BP Location: Left Arm, Patient Position: Sitting, Cuff Size: Normal)   Pulse 75   Temp 98 F (36.7 C) (Oral)   Resp 16   Ht 5' 7 (1.702 m)   Wt 198 lb 3.2 oz (89.9 kg)   SpO2 95%   BMI 31.04 kg/m   BP Readings from Last 3 Encounters:  12/28/24 (!) 140/78  08/23/24 130/80  02/20/24 132/78    Wt Readings from Last 3 Encounters:  12/28/24 198 lb 3.2 oz (89.9 kg)  08/23/24 200 lb 3.2 oz (90.8 kg)  07/20/24 208 lb (94.3 kg)    Physical Exam Vitals reviewed.  Constitutional:      General: He is not in acute distress.    Appearance: He is not toxic-appearing or diaphoretic.  HENT:     Nose: Nose normal.     Mouth/Throat:     Mouth: Mucous membranes are moist.  Eyes:     General: No scleral icterus.    Conjunctiva/sclera: Conjunctivae normal.  Cardiovascular:     Rate and Rhythm: Normal rate and regular rhythm.     Heart sounds: No murmur heard.  No friction rub. No gallop.     Comments: EKG- NSR, 63 bpm No LVH, Q waves, or ST/T wave changes  Pulmonary:     Effort: Pulmonary effort is normal.     Breath sounds: No stridor. No wheezing, rhonchi or rales.  Abdominal:     General: Abdomen is flat.     Palpations: There is no mass.     Tenderness: There is no abdominal tenderness. There is no guarding.     Hernia: No hernia is present.  Musculoskeletal:        General: Normal range of motion.     Cervical back: Normal and neck supple.     Thoracic back: Normal. No swelling, deformity, signs of trauma, spasms, tenderness or bony tenderness.     Lumbar back: Normal.     Right lower leg: No edema.     Left lower leg: No edema.  Lymphadenopathy:     Cervical: No cervical adenopathy.  Skin:    General: Skin is warm  and dry.  Neurological:     General: No focal deficit present.     Mental Status: He is alert.  Psychiatric:        Mood and Affect: Mood normal.        Behavior: Behavior normal.     Lab Results  Component Value Date   WBC 7.1 08/23/2024   HGB 14.5 08/23/2024   HCT 41.9 08/23/2024   PLT 296.0 08/23/2024   GLUCOSE 98 12/28/2024   CHOL 150 08/23/2024   TRIG 97.0 08/23/2024   HDL 46.10 08/23/2024   LDLDIRECT 105.0 07/02/2019   LDLCALC 85 08/23/2024   ALT 14 12/28/2024   AST 15 12/28/2024   NA 139 12/28/2024   K 4.6 12/28/2024   CL 102 12/28/2024   CREATININE 0.84 12/28/2024   BUN 17 12/28/2024   CO2 29 12/28/2024   TSH 2.06 02/20/2024   PSA 0.37 12/28/2024   INR 1.2 (H) 12/28/2024   HGBA1C 5.2 08/06/2023    US  Abdomen Limited RUQ (LIVER/GB) Result Date: 08/06/2023 CLINICAL DATA:  69 year old male with history of epigastric pain. EXAM: ULTRASOUND ABDOMEN LIMITED RIGHT UPPER QUADRANT COMPARISON:  Right upper quadrant abdominal ultrasound 07/13/2019. FINDINGS: Gallbladder: No gallstones or wall thickening visualized. No sonographic Murphy sign noted by sonographer. Common bile duct: Diameter: 5.6 mm Liver: No focal lesion identified. Liver is diffusely heterogeneously echogenic, suggesting a background of cirrhosis. There is also a slight irregular contour of the liver, suggesting underlying cirrhosis. Portal vein is patent on color Doppler imaging with normal direction of blood flow towards the liver. Other: None. IMPRESSION: 1. No acute findings. Specifically, no gallstones and no evidence of acute cholecystitis. 2. Hepatic steatosis. 3. Nodular contour of the liver suggesting early changes of cirrhosis. Electronically Signed   By: Toribio Aye M.D.   On: 08/06/2023 07:59   CT Angio Chest/Abd/Pel for Dissection W and/or Wo Contrast Result Date: 08/06/2023 CLINICAL DATA:  69 year old male with history of substernal chest pain radiating into the mid back and down to the umbilical  region for 1 day. Clinical suspicion for acute aortic syndrome. EXAM: CT ANGIOGRAPHY CHEST, ABDOMEN AND PELVIS TECHNIQUE: Non-contrast CT of the chest was initially obtained. Multidetector CT imaging through the chest, abdomen and pelvis was performed using the standard protocol during bolus administration of intravenous contrast. Multiplanar reconstructed images and MIPs were obtained and reviewed to evaluate the vascular anatomy. RADIATION DOSE REDUCTION: This exam was performed according to the departmental dose-optimization program which includes  automated exposure control, adjustment of the mA and/or kV according to patient size and/or use of iterative reconstruction technique. CONTRAST:  100mL OMNIPAQUE  IOHEXOL  350 MG/ML SOLN COMPARISON:  No priors. FINDINGS: CTA CHEST FINDINGS Cardiovascular: Atherosclerotic calcifications are noted throughout the thoracic aorta. No definite coronary artery calcifications are noted. Precontrast images demonstrate no crescentic high attenuation associated with the wall of the thoracic aorta to indicate the presence of acute intramural hemorrhage. Postcontrast images demonstrate no aneurysm or dissection of the thoracic aorta. Ascending thoracic aorta, mid arch and descending thoracic aorta are normal in caliber measuring 3.0 cm, 2.5 cm and 2.5 cm in diameter respectively. Heart size is normal. There is no significant pericardial fluid, thickening or pericardial calcification. Mediastinum/Nodes: No pathologically enlarged mediastinal or hilar lymph nodes. Esophagus is unremarkable in appearance. No axillary lymphadenopathy. Lungs/Pleura: No acute consolidative airspace disease. No pleural effusions. No definite suspicious appearing pulmonary nodules or masses are noted. Mild diffuse bronchial wall thickening with mild centrilobular and paraseptal emphysema. Musculoskeletal: There are no aggressive appearing lytic or blastic lesions noted in the visualized portions of the  skeleton. Review of the MIP images confirms the above findings. CTA ABDOMEN AND PELVIS FINDINGS VASCULAR Aorta: Aortic atherosclerosis. Normal caliber aorta without aneurysm, dissection, vasculitis or significant stenosis. Celiac: Patent without evidence of aneurysm, dissection, vasculitis or significant stenosis. SMA: Patent without evidence of aneurysm, dissection, vasculitis or significant stenosis. Renals: Both renal arteries are patent without evidence of aneurysm, dissection, vasculitis, fibromuscular dysplasia or significant stenosis. IMA: Patent without evidence of aneurysm, dissection, vasculitis or significant stenosis. Inflow: Patent without evidence of aneurysm, dissection, vasculitis or significant stenosis. Veins: No obvious venous abnormality within the limitations of this arterial phase study. Review of the MIP images confirms the above findings. NON-VASCULAR Hepatobiliary: No definite suspicious cystic or solid hepatic lesions are noted on today's arterial phase examination. There is diffuse low attenuation throughout the hepatic parenchyma, indicative of a background of hepatic steatosis. No intra or extrahepatic biliary ductal dilatation. Gallbladder is moderately distended. There is a suggestion of some stones and/or sludge lying dependently in the gallbladder. Trace amount of soft tissue stranding surrounding the gallbladder. Pancreas: No pancreatic mass. No pancreatic ductal dilatation. No pancreatic or peripancreatic fluid collections or inflammatory changes. Spleen: Unremarkable. Adrenals/Urinary Tract: Bilateral kidneys and bilateral adrenal glands are normal in appearance. No hydroureteronephrosis. Urinary bladder is moderately distended, but otherwise unremarkable in appearance. Stomach/Bowel: The appearance of the stomach is normal. Subtle soft tissue stranding is noted adjacent to the second portion of the duodenum. No pathologic dilatation of small bowel or colon. Normal appendix.  Lymphatic: No lymphadenopathy noted in the abdomen or pelvis. Reproductive: Prostate gland and seminal vesicles are unremarkable in appearance. Other: No significant volume of ascites. No pneumoperitoneum. Small umbilical hernia containing only omental fat. Musculoskeletal: There are no aggressive appearing lytic or blastic lesions noted in the visualized portions of the skeleton. Review of the MIP images confirms the above findings. IMPRESSION: 1. While there is aortic atherosclerosis, there are no findings to suggest acute aortic syndrome. 2. Gallbladder is moderately distended with a suggestion of some dependent stones and/or biliary sludge, as well as a trace amount of surrounding soft tissue stranding. These findings could indicate an acute cholecystitis. Further clinical evaluation is recommended. Consideration for follow-up right upper quadrant abdominal ultrasound is recommended if clinically appropriate. 3. Subtle inflammatory changes also noted adjacent to the second portion of the duodenal. If there is no evidence of acute cholecystitis, clinical correlation for signs and symptoms of  potential duodenitis is suggested. 4. Hepatic steatosis. 5. Tiny umbilical hernia containing only omental fat. No associated bowel incarceration or obstruction at this time. 6. Mild diffuse bronchial wall thickening with mild centrilobular and paraseptal emphysema; imaging findings suggestive of underlying COPD. Electronically Signed   By: Toribio Aye M.D.   On: 08/06/2023 05:38   DG Chest 2 View Result Date: 08/06/2023 CLINICAL DATA:  Chest pain. EXAM: CHEST - 2 VIEW COMPARISON:  Chest x-ray 03/12/2013. FINDINGS: The heart size and mediastinal contours are within normal limits. Both lungs are clear. The visualized skeletal structures are unremarkable. IMPRESSION: No active cardiopulmonary disease. Electronically Signed   By: Greig Pique M.D.   On: 08/06/2023 01:29    No results found.    The 10-year ASCVD risk  score (Arnett DK, et al., 2019) is: 17.3%   Values used to calculate the score:     Age: 42 years     Clinically relevant sex: Male     Is Non-Hispanic African American: No     Diabetic: No     Tobacco smoker: No     Systolic Blood Pressure: 140 mmHg     Is BP treated: Yes     HDL Cholesterol: 46.1 mg/dL     Total Cholesterol: 150 mg/dL   Assessment & Plan:  Essential hypertension -     Basic metabolic panel with GFR; Future -     EKG 12-Lead  Benign prostatic hyperplasia without lower urinary tract symptoms -     PSA; Future  Acute midline thoracic back pain -     DG Thoracic Spine W/Swimmers; Future  Alcoholic cirrhosis of liver without ascites (HCC) -     AFP tumor marker; Future -     Protime-INR; Future -     Hepatic function panel; Future -     Vitamin B-1; Take 1 tablet (50 mg total) by mouth daily.  Dispense: 90 tablet; Refill: 0     Follow-up: Return in about 4 months (around 04/27/2025).  Debby Molt, MD "

## 2024-12-30 MED ORDER — VITAMIN B-1 50 MG PO TABS: 50.0000 mg | ORAL_TABLET | Freq: Every day | ORAL | 0 refills | Status: AC

## 2025-01-01 ENCOUNTER — Telehealth: Payer: Self-pay

## 2025-01-01 MED ORDER — TRAMADOL HCL ER 100 MG PO TB24: 100.0000 mg | ORAL_TABLET | Freq: Every day | ORAL | 0 refills | Status: AC | PRN

## 2025-01-01 NOTE — Telephone Encounter (Signed)
 I was waiting for his labs to return

## 2025-01-01 NOTE — Telephone Encounter (Signed)
 PATIENT HAS BEEN MADE AWARE AND GAVE A VERBAL UNDERSTANDING.

## 2025-01-01 NOTE — Telephone Encounter (Signed)
 Please advise.

## 2025-01-03 LAB — AFP TUMOR MARKER: AFP-Tumor Marker: 3 ng/mL

## 2025-01-04 ENCOUNTER — Telehealth: Payer: Self-pay

## 2025-01-04 ENCOUNTER — Ambulatory Visit: Payer: Self-pay | Admitting: Internal Medicine

## 2025-01-04 NOTE — Telephone Encounter (Signed)
 Copied from CRM [id number]. Topic: Clinical - Lab/Test Results >> Jan 04, 2025  1:44 PM Shereese L wrote: Reason for CRM: Patients wife called in and requesting a call back to go over fpl group of results.SABRA  CB# [phone]

## 2025-01-16 ENCOUNTER — Other Ambulatory Visit

## 2025-04-24 ENCOUNTER — Ambulatory Visit: Admitting: Internal Medicine

## 2025-07-22 ENCOUNTER — Ambulatory Visit
